# Patient Record
Sex: Male | Born: 1965 | Race: White | Hispanic: No | Marital: Married | Smoking: Current some day smoker
Health system: Southern US, Community
[De-identification: ages and names within clinical notes are randomized; demographics above are authoritative.]

## PROBLEM LIST (undated history)

## (undated) DIAGNOSIS — I2510 Atherosclerotic heart disease of native coronary artery without angina pectoris: Secondary | ICD-10-CM

## (undated) DIAGNOSIS — I639 Cerebral infarction, unspecified: Secondary | ICD-10-CM

## (undated) DIAGNOSIS — C801 Malignant (primary) neoplasm, unspecified: Secondary | ICD-10-CM

## (undated) DIAGNOSIS — F1010 Alcohol abuse, uncomplicated: Secondary | ICD-10-CM

## (undated) DIAGNOSIS — R569 Unspecified convulsions: Secondary | ICD-10-CM

## (undated) DIAGNOSIS — Z720 Tobacco use: Secondary | ICD-10-CM

## (undated) DIAGNOSIS — I10 Essential (primary) hypertension: Secondary | ICD-10-CM

## (undated) HISTORY — PX: CORONARY ANGIOPLASTY WITH STENT PLACEMENT: SHX49

---

## 2015-03-16 DIAGNOSIS — R51 Headache: Secondary | ICD-10-CM | POA: Diagnosis not present

## 2015-03-16 DIAGNOSIS — I6521 Occlusion and stenosis of right carotid artery: Secondary | ICD-10-CM | POA: Diagnosis not present

## 2015-03-16 DIAGNOSIS — I639 Cerebral infarction, unspecified: Secondary | ICD-10-CM | POA: Diagnosis not present

## 2015-03-16 DIAGNOSIS — G459 Transient cerebral ischemic attack, unspecified: Secondary | ICD-10-CM | POA: Diagnosis not present

## 2015-03-16 DIAGNOSIS — R202 Paresthesia of skin: Secondary | ICD-10-CM | POA: Diagnosis not present

## 2015-03-16 DIAGNOSIS — Z6834 Body mass index (BMI) 34.0-34.9, adult: Secondary | ICD-10-CM | POA: Diagnosis not present

## 2015-03-16 DIAGNOSIS — F17210 Nicotine dependence, cigarettes, uncomplicated: Secondary | ICD-10-CM | POA: Diagnosis not present

## 2015-03-16 DIAGNOSIS — F319 Bipolar disorder, unspecified: Secondary | ICD-10-CM | POA: Diagnosis not present

## 2015-03-16 DIAGNOSIS — I63411 Cerebral infarction due to embolism of right middle cerebral artery: Secondary | ICD-10-CM | POA: Diagnosis not present

## 2015-03-16 DIAGNOSIS — I517 Cardiomegaly: Secondary | ICD-10-CM | POA: Diagnosis not present

## 2015-03-16 DIAGNOSIS — F17200 Nicotine dependence, unspecified, uncomplicated: Secondary | ICD-10-CM | POA: Diagnosis not present

## 2015-03-16 DIAGNOSIS — E785 Hyperlipidemia, unspecified: Secondary | ICD-10-CM | POA: Diagnosis not present

## 2015-03-16 DIAGNOSIS — I252 Old myocardial infarction: Secondary | ICD-10-CM | POA: Diagnosis not present

## 2015-03-16 DIAGNOSIS — Z955 Presence of coronary angioplasty implant and graft: Secondary | ICD-10-CM | POA: Diagnosis not present

## 2015-03-16 DIAGNOSIS — I63311 Cerebral infarction due to thrombosis of right middle cerebral artery: Secondary | ICD-10-CM | POA: Diagnosis not present

## 2015-03-16 DIAGNOSIS — I10 Essential (primary) hypertension: Secondary | ICD-10-CM | POA: Diagnosis not present

## 2015-03-16 DIAGNOSIS — G9389 Other specified disorders of brain: Secondary | ICD-10-CM | POA: Diagnosis not present

## 2015-03-16 DIAGNOSIS — I2510 Atherosclerotic heart disease of native coronary artery without angina pectoris: Secondary | ICD-10-CM | POA: Diagnosis not present

## 2015-03-16 DIAGNOSIS — E669 Obesity, unspecified: Secondary | ICD-10-CM | POA: Diagnosis not present

## 2015-03-16 DIAGNOSIS — R200 Anesthesia of skin: Secondary | ICD-10-CM | POA: Diagnosis not present

## 2015-03-16 DIAGNOSIS — R030 Elevated blood-pressure reading, without diagnosis of hypertension: Secondary | ICD-10-CM | POA: Diagnosis not present

## 2015-04-06 DIAGNOSIS — I63411 Cerebral infarction due to embolism of right middle cerebral artery: Secondary | ICD-10-CM | POA: Diagnosis not present

## 2015-04-06 DIAGNOSIS — I2510 Atherosclerotic heart disease of native coronary artery without angina pectoris: Secondary | ICD-10-CM | POA: Diagnosis not present

## 2016-12-17 DIAGNOSIS — F10230 Alcohol dependence with withdrawal, uncomplicated: Secondary | ICD-10-CM | POA: Diagnosis not present

## 2016-12-17 DIAGNOSIS — I2510 Atherosclerotic heart disease of native coronary artery without angina pectoris: Secondary | ICD-10-CM | POA: Diagnosis not present

## 2016-12-17 DIAGNOSIS — F1320 Sedative, hypnotic or anxiolytic dependence, uncomplicated: Secondary | ICD-10-CM | POA: Diagnosis not present

## 2016-12-17 DIAGNOSIS — F1329 Sedative, hypnotic or anxiolytic dependence with unspecified sedative, hypnotic or anxiolytic-induced disorder: Secondary | ICD-10-CM | POA: Diagnosis not present

## 2016-12-17 DIAGNOSIS — F69 Unspecified disorder of adult personality and behavior: Secondary | ICD-10-CM | POA: Diagnosis not present

## 2016-12-17 DIAGNOSIS — Z8673 Personal history of transient ischemic attack (TIA), and cerebral infarction without residual deficits: Secondary | ICD-10-CM | POA: Diagnosis not present

## 2016-12-17 DIAGNOSIS — F1020 Alcohol dependence, uncomplicated: Secondary | ICD-10-CM | POA: Diagnosis not present

## 2016-12-17 DIAGNOSIS — I252 Old myocardial infarction: Secondary | ICD-10-CM | POA: Diagnosis not present

## 2016-12-17 DIAGNOSIS — I10 Essential (primary) hypertension: Secondary | ICD-10-CM | POA: Diagnosis not present

## 2016-12-17 DIAGNOSIS — F10239 Alcohol dependence with withdrawal, unspecified: Secondary | ICD-10-CM | POA: Diagnosis not present

## 2016-12-17 DIAGNOSIS — Z8546 Personal history of malignant neoplasm of prostate: Secondary | ICD-10-CM | POA: Diagnosis not present

## 2016-12-17 DIAGNOSIS — F17210 Nicotine dependence, cigarettes, uncomplicated: Secondary | ICD-10-CM | POA: Diagnosis not present

## 2016-12-17 DIAGNOSIS — F1910 Other psychoactive substance abuse, uncomplicated: Secondary | ICD-10-CM | POA: Diagnosis not present

## 2016-12-17 DIAGNOSIS — F13239 Sedative, hypnotic or anxiolytic dependence with withdrawal, unspecified: Secondary | ICD-10-CM | POA: Diagnosis not present

## 2016-12-17 DIAGNOSIS — Z905 Acquired absence of kidney: Secondary | ICD-10-CM | POA: Diagnosis not present

## 2017-01-02 DIAGNOSIS — Z8546 Personal history of malignant neoplasm of prostate: Secondary | ICD-10-CM | POA: Diagnosis not present

## 2017-01-02 DIAGNOSIS — I252 Old myocardial infarction: Secondary | ICD-10-CM | POA: Diagnosis not present

## 2017-01-02 DIAGNOSIS — F17200 Nicotine dependence, unspecified, uncomplicated: Secondary | ICD-10-CM | POA: Diagnosis not present

## 2017-01-02 DIAGNOSIS — F1010 Alcohol abuse, uncomplicated: Secondary | ICD-10-CM | POA: Diagnosis not present

## 2017-01-02 DIAGNOSIS — Z85528 Personal history of other malignant neoplasm of kidney: Secondary | ICD-10-CM | POA: Diagnosis not present

## 2017-01-02 DIAGNOSIS — Z8673 Personal history of transient ischemic attack (TIA), and cerebral infarction without residual deficits: Secondary | ICD-10-CM | POA: Diagnosis not present

## 2017-01-02 DIAGNOSIS — Z6829 Body mass index (BMI) 29.0-29.9, adult: Secondary | ICD-10-CM | POA: Diagnosis not present

## 2018-02-19 DIAGNOSIS — E872 Acidosis: Secondary | ICD-10-CM

## 2018-02-19 DIAGNOSIS — R748 Abnormal levels of other serum enzymes: Secondary | ICD-10-CM

## 2018-02-19 DIAGNOSIS — F10239 Alcohol dependence with withdrawal, unspecified: Secondary | ICD-10-CM | POA: Diagnosis not present

## 2018-02-20 DIAGNOSIS — F10239 Alcohol dependence with withdrawal, unspecified: Secondary | ICD-10-CM | POA: Diagnosis not present

## 2018-02-20 DIAGNOSIS — R748 Abnormal levels of other serum enzymes: Secondary | ICD-10-CM | POA: Diagnosis not present

## 2018-02-20 DIAGNOSIS — E872 Acidosis: Secondary | ICD-10-CM | POA: Diagnosis not present

## 2018-02-21 DIAGNOSIS — R748 Abnormal levels of other serum enzymes: Secondary | ICD-10-CM | POA: Diagnosis not present

## 2018-02-21 DIAGNOSIS — F10239 Alcohol dependence with withdrawal, unspecified: Secondary | ICD-10-CM | POA: Diagnosis not present

## 2018-02-21 DIAGNOSIS — E872 Acidosis: Secondary | ICD-10-CM | POA: Diagnosis not present

## 2018-08-04 ENCOUNTER — Inpatient Hospital Stay (HOSPITAL_COMMUNITY)
Admission: AD | Admit: 2018-08-04 | Discharge: 2018-08-07 | DRG: 065 | Disposition: A | Discharge status: Home Health | Payer: Medicare HMO | Source: Other Acute Inpatient Hospital | Attending: Internal Medicine | Admitting: Internal Medicine

## 2018-08-04 DIAGNOSIS — I10 Essential (primary) hypertension: Secondary | ICD-10-CM | POA: Diagnosis not present

## 2018-08-04 DIAGNOSIS — R402252 Coma scale, best verbal response, oriented, at arrival to emergency department: Secondary | ICD-10-CM | POA: Diagnosis not present

## 2018-08-04 DIAGNOSIS — R402362 Coma scale, best motor response, obeys commands, at arrival to emergency department: Secondary | ICD-10-CM | POA: Diagnosis present

## 2018-08-04 DIAGNOSIS — E876 Hypokalemia: Secondary | ICD-10-CM | POA: Diagnosis not present

## 2018-08-04 DIAGNOSIS — Z9114 Patient's other noncompliance with medication regimen: Secondary | ICD-10-CM | POA: Diagnosis not present

## 2018-08-04 DIAGNOSIS — I2583 Coronary atherosclerosis due to lipid rich plaque: Secondary | ICD-10-CM | POA: Diagnosis not present

## 2018-08-04 DIAGNOSIS — I340 Nonrheumatic mitral (valve) insufficiency: Secondary | ICD-10-CM | POA: Diagnosis not present

## 2018-08-04 DIAGNOSIS — I129 Hypertensive chronic kidney disease with stage 1 through stage 4 chronic kidney disease, or unspecified chronic kidney disease: Secondary | ICD-10-CM | POA: Diagnosis present

## 2018-08-04 DIAGNOSIS — R29702 NIHSS score 2: Secondary | ICD-10-CM | POA: Diagnosis present

## 2018-08-04 DIAGNOSIS — R402142 Coma scale, eyes open, spontaneous, at arrival to emergency department: Secondary | ICD-10-CM | POA: Diagnosis not present

## 2018-08-04 DIAGNOSIS — Z8249 Family history of ischemic heart disease and other diseases of the circulatory system: Secondary | ICD-10-CM

## 2018-08-04 DIAGNOSIS — R911 Solitary pulmonary nodule: Secondary | ICD-10-CM | POA: Diagnosis present

## 2018-08-04 DIAGNOSIS — I639 Cerebral infarction, unspecified: Secondary | ICD-10-CM | POA: Diagnosis present

## 2018-08-04 DIAGNOSIS — Z720 Tobacco use: Secondary | ICD-10-CM | POA: Diagnosis not present

## 2018-08-04 DIAGNOSIS — Z23 Encounter for immunization: Secondary | ICD-10-CM | POA: Diagnosis present

## 2018-08-04 DIAGNOSIS — N182 Chronic kidney disease, stage 2 (mild): Secondary | ICD-10-CM | POA: Diagnosis present

## 2018-08-04 DIAGNOSIS — H543 Unqualified visual loss, both eyes: Secondary | ICD-10-CM | POA: Diagnosis present

## 2018-08-04 DIAGNOSIS — D62 Acute posthemorrhagic anemia: Secondary | ICD-10-CM | POA: Diagnosis present

## 2018-08-04 DIAGNOSIS — I6300 Cerebral infarction due to thrombosis of unspecified precerebral artery: Secondary | ICD-10-CM | POA: Diagnosis not present

## 2018-08-04 DIAGNOSIS — F17210 Nicotine dependence, cigarettes, uncomplicated: Secondary | ICD-10-CM | POA: Diagnosis present

## 2018-08-04 DIAGNOSIS — E785 Hyperlipidemia, unspecified: Secondary | ICD-10-CM

## 2018-08-04 DIAGNOSIS — I2510 Atherosclerotic heart disease of native coronary artery without angina pectoris: Secondary | ICD-10-CM | POA: Diagnosis present

## 2018-08-04 DIAGNOSIS — N179 Acute kidney failure, unspecified: Secondary | ICD-10-CM | POA: Diagnosis present

## 2018-08-04 DIAGNOSIS — Z8673 Personal history of transient ischemic attack (TIA), and cerebral infarction without residual deficits: Secondary | ICD-10-CM | POA: Diagnosis not present

## 2018-08-04 DIAGNOSIS — Z955 Presence of coronary angioplasty implant and graft: Secondary | ICD-10-CM | POA: Diagnosis not present

## 2018-08-04 HISTORY — DX: Essential (primary) hypertension: I10

## 2018-08-04 LAB — CREATININE, SERUM
Creatinine, Ser: 1.32 mg/dL — ABNORMAL HIGH (ref 0.61–1.24)
GFR, EST NON AFRICAN AMERICAN: 60 mL/min — AB (ref >60)

## 2018-08-04 LAB — CBC
HEMATOCRIT: 36 % — AB (ref 39.0–52.0)
Hemoglobin: 11.8 g/dL — ABNORMAL LOW (ref 13.0–17.0)
MCH: 32 pg (ref 26.0–34.0)
MCHC: 32.8 g/dL (ref 30.0–36.0)
MCV: 97.6 fL (ref 78.0–100.0)
Platelets: 156 10*3/uL (ref 150–400)
RBC: 3.69 MIL/uL — ABNORMAL LOW (ref 4.22–5.81)
RDW: 13.5 % (ref 11.5–15.5)
WBC: 8.6 10*3/uL (ref 4.0–10.5)

## 2018-08-04 MED ORDER — STROKE: EARLY STAGES OF RECOVERY BOOK
Freq: Once | Status: AC
  Administered 2018-08-04

## 2018-08-04 MED ORDER — HYDRALAZINE HCL 20 MG/ML IJ SOLN: 10.0000 mg | Freq: Once | INTRAMUSCULAR | Status: DC

## 2018-08-04 MED ORDER — ENOXAPARIN SODIUM 40 MG/0.4ML ~~LOC~~ SOLN
40.0000 mg | SUBCUTANEOUS | Status: DC
  Administered 2018-08-04 – 2018-08-06 (×3): 40 mg via SUBCUTANEOUS
  Filled 2018-08-04 (×3): qty 0.4

## 2018-08-04 MED ORDER — ASPIRIN 325 MG PO TABS
325.0000 mg | ORAL_TABLET | Freq: Every day | ORAL | Status: DC
  Administered 2018-08-04 – 2018-08-07 (×4): 325 mg via ORAL
  Filled 2018-08-04 (×4): qty 1

## 2018-08-04 MED ORDER — ASPIRIN 300 MG RE SUPP: 300.0000 mg | Freq: Every day | RECTAL | Status: DC

## 2018-08-04 MED ORDER — ACETAMINOPHEN 650 MG RE SUPP: 650.0000 mg | Freq: Four times a day (QID) | RECTAL | Status: DC | PRN

## 2018-08-04 MED ORDER — ACETAMINOPHEN 325 MG PO TABS
650.0000 mg | ORAL_TABLET | Freq: Four times a day (QID) | ORAL | Status: DC | PRN
  Administered 2018-08-05 – 2018-08-07 (×4): 650 mg via ORAL
  Filled 2018-08-04 (×4): qty 2

## 2018-08-04 MED ORDER — ATORVASTATIN CALCIUM 80 MG PO TABS
80.0000 mg | ORAL_TABLET | Freq: Every day | ORAL | Status: DC
  Administered 2018-08-05 – 2018-08-06 (×2): 80 mg via ORAL
  Filled 2018-08-04 (×2): qty 1

## 2018-08-04 MED ORDER — ONDANSETRON HCL 4 MG/2ML IJ SOLN: 4.0000 mg | Freq: Four times a day (QID) | INTRAMUSCULAR | Status: DC | PRN

## 2018-08-04 MED ORDER — ONDANSETRON HCL 4 MG PO TABS: 4.0000 mg | ORAL_TABLET | Freq: Four times a day (QID) | ORAL | Status: DC | PRN

## 2018-08-04 NOTE — H&P (Signed)
History and Physical    Douglas Fowler PYK:[id number] DOB: [birth year] DOA: 08/04/2018  PCP: Imagene Riches, NP   Patient coming from: Home   Chief Complaint: Episodic visual loss.   HPI: Douglas Fowler is a 52 y.o. male with medical history significant of coronary artery disease, hypertension, dyslipidemia and prior strokes who presents with episodic visual loss.  Patient reported 2-week history of intermittent sudden acute visual loss, with no prodromal symptoms, no triggering factors, the events tend to last for a few minutes, it has been associated with bilateral lower extremity weakness with ambulatory dysfunction and disbalance, no improving or worsening factors.  Today he was seen by his primary care provider and recommend him to go to the hospital for further evaluation.  Apparently he stopped taking his antihypertensives and antihyperlipidemic medications about 7 months ago.   ED Course: Patient was seen at [facility], CT of the head showed new right frontal and parietal lobe densities concerning for new ischemic stroke.  Neurology was consulted and patient was transferred to [facility] for further evaluation.  Review of Systems:  1. General: No fevers, no chills, no weight gain or weight loss 2. ENT: No runny nose or sore throat, no hearing disturbances 3. Pulmonary: No dyspnea, cough, wheezing, or hemoptysis 4. Cardiovascular: No angina, claudication, lower extremity edema, pnd or orthopnea 5. Gastrointestinal: No nausea or vomiting, no diarrhea or constipation 6. Hematology: No easy bruisability or frequent infections 7. Urology: No dysuria, hematuria or increased urinary frequency 8. Dermatology: No rashes. 9. Neurology: No seizures or paresthesias. Intermittent visual loss as mentioned in HPI, positive disbalance.  10. Musculoskeletal: No joint pain or deformities  No past medical history on file.     has no tobacco, alcohol, and drug history on file.  Allergies  not on file  No family history on file.  Family history reviewed and found to be noncontributory.   Prior to Admission medications   Not on File    Physical Exam: Vitals:   08/04/18 2043  BP: (!) 157/103  Pulse: 72  Resp: 18  Temp: 97.8 F (36.6 C)  TempSrc: Axillary  SpO2: 100%  Weight: 80.8 kg  Height: 5\' 6"  (1.676 m)    Constitutional: deconditioned  Vitals:   08/04/18 2043  BP: (!) 157/103  Pulse: 72  Resp: 18  Temp: 97.8 F (36.6 C)  TempSrc: Axillary  SpO2: 100%  Weight: 80.8 kg  Height: 5\' 6"  (1.676 m)   Eyes: PERRL, lids and conjunctivae normal Head normocephalic, nose and ears with no deformities ENMT: Mucous membranes are moist. Posterior pharynx clear of any exudate or lesions.Normal dentition.  Neck: normal, supple, no masses, no thyromegaly Respiratory: clear to auscultation bilaterally, no wheezing, no crackles. Normal respiratory effort. No accessory muscle use.  Cardiovascular: Regular rate and rhythm, no murmurs / rubs / gallops. No extremity edema. 2+ pedal pulses. No carotid bruits.  Abdomen: no tenderness, no masses palpated. No hepatosplenomegaly. Bowel sounds positive.  Musculoskeletal: no clubbing / cyanosis. No joint deformity upper and lower extremities. Good ROM, no contractures. Normal muscle tone.  Skin: no rashes, lesions, ulcers. No induration Neurologic: preserved strength and coordination in 4 extremities, no evident cranial nerves dysfunction.    Labs on Admission: I have personally reviewed following labs and imaging studies  CBC: No results for input(s): WBC, NEUTROABS, HGB, HCT, MCV, PLT in the last 168 hours. Basic Metabolic Panel: No results for input(s): NA, K, CL, CO2, GLUCOSE, BUN, CREATININE, CALCIUM, MG, PHOS  in the last 168 hours. GFR: CrCl cannot be calculated (No successful lab value found.). Liver Function Tests: No results for input(s): AST, ALT, ALKPHOS, BILITOT, PROT, ALBUMIN in the last 168 hours. No results  for input(s): LIPASE, AMYLASE in the last 168 hours. No results for input(s): AMMONIA in the last 168 hours. Coagulation Profile: No results for input(s): INR, PROTIME in the last 168 hours. Cardiac Enzymes: No results for input(s): CKTOTAL, CKMB, CKMBINDEX, TROPONINI in the last 168 hours. BNP (last 3 results) No results for input(s): PROBNP in the last 8760 hours. HbA1C: No results for input(s): HGBA1C in the last 72 hours. CBG: No results for input(s): GLUCAP in the last 168 hours. Lipid Profile: No results for input(s): CHOL, HDL, LDLCALC, TRIG, CHOLHDL, LDLDIRECT in the last 72 hours. Thyroid Function Tests: No results for input(s): TSH, T4TOTAL, FREET4, T3FREE, THYROIDAB in the last 72 hours. Anemia Panel: No results for input(s): VITAMINB12, FOLATE, FERRITIN, TIBC, IRON, RETICCTPCT in the last 72 hours. Urine analysis: No results found for: COLORURINE, APPEARANCEUR, LABSPEC, PHURINE, GLUCOSEU, HGBUR, BILIRUBINUR, KETONESUR, PROTEINUR, UROBILINOGEN, NITRITE, LEUKOCYTESUR  Radiological Exams on Admission: No results found.  EKG: Independently reviewed. NA  Assessment/Plan Active Problems:   CVA (cerebral vascular accident) ([place])  52 year old male with history of prior CVAs, has been noncompliant with antihypertensive and antihyperlipidemic agents for the last 7 months.  Presents with intermittent episodes of visual loss, lasting for about few minutes, associated with disbalance and amatory dysfunction.  On the initial physical examination patient is awake and alert, he is neurologically nonfocal blood pressure 199/70, heart rate 77, respiratory rate 23, oxygen saturation 97%.  Moist mucous membranes, lungs clear to auscultation bilaterally, heart S1-S2 present and rhythmic, abdomen soft nontender, no lower extremity edema.  White cell count 8.6, hemoglobin 11.8, hematocrit 36, platelets 156, creatinine 1.32, CT of the head showed new right frontal and parietal lobe densities  concerning for new ischemic stroke.   Patient will be admitted to the hospital with working diagnosis of focal neurologic deficit due to acute CVA  1.  Intermittent visual loss, CVA.  Patient will be admitted to the medical ward, he will be placed in a remote telemetry monitor, frequent neuro checks, NIH score per protocol.  Aspirin, statin, physical therapy, speech therapy, occupational therapy.  Further testing with brain MRI/MRA,,echocardiography, carotid Doppler ultrasonography and follow-up on stroke team in the morning.  Check lipid profile and hemoglobin A1c.  2.  Hypertension.  Continue blood pressure monitor, will avoid significant decrease in blood pressure in the setting of acute CVA.  Systolic 076 on admission.  3.  Dyslipidemia.  Lipid profile, continue atorvastatin.  5.  Chronic kidney disease stage 2.  Follow-up kidney function in the morning, avoid hypotension nephrotoxic agents.  DVT prophylaxis:  Enoxaparin  Code Status: full  Family Communication: I spoke with patient's wife at the bedside and all questions were addressed.   Disposition Plan:  Home pending clinical improvement   Consults called:  Neurology   Admission status: Inpatient.     Mauricio Gerome Apley MD [facility] Pager [phone]  If 7PM-7AM, please contact night-coverage [email or web address] Password [email or web address]  08/04/2018, 9:05 PM

## 2018-08-05 ENCOUNTER — Inpatient Hospital Stay (HOSPITAL_COMMUNITY): Payer: Medicare HMO

## 2018-08-05 ENCOUNTER — Encounter (HOSPITAL_COMMUNITY): Payer: Self-pay | Admitting: Speech Pathology

## 2018-08-05 DIAGNOSIS — I2510 Atherosclerotic heart disease of native coronary artery without angina pectoris: Secondary | ICD-10-CM | POA: Diagnosis present

## 2018-08-05 DIAGNOSIS — E876 Hypokalemia: Secondary | ICD-10-CM

## 2018-08-05 DIAGNOSIS — Z720 Tobacco use: Secondary | ICD-10-CM | POA: Diagnosis present

## 2018-08-05 DIAGNOSIS — N179 Acute kidney failure, unspecified: Secondary | ICD-10-CM

## 2018-08-05 DIAGNOSIS — I340 Nonrheumatic mitral (valve) insufficiency: Secondary | ICD-10-CM

## 2018-08-05 DIAGNOSIS — I639 Cerebral infarction, unspecified: Principal | ICD-10-CM

## 2018-08-05 DIAGNOSIS — I10 Essential (primary) hypertension: Secondary | ICD-10-CM

## 2018-08-05 DIAGNOSIS — D62 Acute posthemorrhagic anemia: Secondary | ICD-10-CM

## 2018-08-05 LAB — LIPID PANEL
CHOL/HDL RATIO: 4.6 ratio
Cholesterol: 175 mg/dL (ref 0–200)
HDL: 38 mg/dL — AB (ref >40)
LDL Cholesterol: 104 mg/dL — ABNORMAL HIGH (ref 0–99)
Triglycerides: 167 mg/dL — ABNORMAL HIGH (ref <150)
VLDL: 33 mg/dL (ref 0–40)

## 2018-08-05 LAB — BASIC METABOLIC PANEL
ANION GAP: 8 (ref 5–15)
BUN: 14 mg/dL (ref 6–20)
CO2: 26 mmol/L (ref 22–32)
Calcium: 8.8 mg/dL — ABNORMAL LOW (ref 8.9–10.3)
Chloride: 106 mmol/L (ref 98–111)
Creatinine, Ser: 1.23 mg/dL (ref 0.61–1.24)
GFR calc Af Amer: >60 mL/min (ref >60)
GLUCOSE: 119 mg/dL — AB (ref 70–99)
POTASSIUM: 3.3 mmol/L — AB (ref 3.5–5.1)
Sodium: 140 mmol/L (ref 135–145)

## 2018-08-05 LAB — CBC
HEMATOCRIT: 35.5 % — AB (ref 39.0–52.0)
HEMOGLOBIN: 11.6 g/dL — AB (ref 13.0–17.0)
MCH: 31.9 pg (ref 26.0–34.0)
MCHC: 32.7 g/dL (ref 30.0–36.0)
MCV: 97.5 fL (ref 78.0–100.0)
Platelets: 154 10*3/uL (ref 150–400)
RBC: 3.64 MIL/uL — ABNORMAL LOW (ref 4.22–5.81)
RDW: 13.2 % (ref 11.5–15.5)
WBC: 7.6 10*3/uL (ref 4.0–10.5)

## 2018-08-05 LAB — ECHOCARDIOGRAM COMPLETE
Height: 66 in
WEIGHTICAEL: 2850.11 [oz_av]

## 2018-08-05 LAB — HEMOGLOBIN A1C
Hgb A1c MFr Bld: 5.6 % (ref 4.8–5.6)
MEAN PLASMA GLUCOSE: 114.02 mg/dL

## 2018-08-05 LAB — HIV ANTIBODY (ROUTINE TESTING W REFLEX): HIV Screen 4th Generation wRfx: NONREACTIVE

## 2018-08-05 MED ORDER — INFLUENZA VAC SPLIT QUAD 0.5 ML IM SUSY
0.5000 mL | PREFILLED_SYRINGE | INTRAMUSCULAR | Status: AC
  Administered 2018-08-06: 0.5 mL via INTRAMUSCULAR
  Filled 2018-08-05: qty 0.5

## 2018-08-05 NOTE — Evaluation (Signed)
Occupational Therapy Evaluation Patient Details Name: Douglas Fowler MRN: [id number] DOB: [birth year] Today's Date: 08/05/2018    History of Present Illness Douglas Fowler is a 52 y.o. male PMH including CAD, HTN, dyslipidemia and prior strokes who presents with episodic visual loss associated with bilateral lower extremity weakness with ambulatory dysfunction and disbalance. CT of the head showed new right frontal and parietal lobe densities concerning for new ischemic stroke. MRI pending.   Clinical Impression   PTA Pt independent in ADL/IADL and mobility. 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 Pt is currently mod A for mobility in room and generally for ADL. Significant change in vision (please see vision section below) and left visual deficit with potentially left inattention as well?!? Requires significant assist for short mobility from bed to sink and then to recliner with left lateral lean noted during functional tasks at sink. Pt will benefit from skilled OT in the acute setting and also at the CIR level to maximize safety and independence in ADL and functional transfers with focus on cognition and vision impacting functional tasks.     Follow Up Recommendations  CIR;Supervision/Assistance - 24 hour    Equipment Recommendations  Tub/shower seat    Recommendations for Other Services Rehab consult     Precautions / Restrictions Precautions Precautions: Fall Precaution Comments: left inattention? visual deficits Restrictions Weight Bearing Restrictions: No      Mobility Bed Mobility Overal bed mobility: Needs Assistance Bed Mobility: Supine to Sit;Sit to Supine     Supine to sit: Supervision Sit to supine: Supervision   General bed mobility comments: use of rail to assist  Transfers Overall transfer level: Needs assistance Equipment used: None Transfers: Sit to/from Stand Sit to Stand: Min guard          General transfer comment: min guard for balance    Balance Overall balance assessment: Needs assistance Sitting-balance support: No upper extremity supported Sitting balance-Leahy Scale: Normal   Postural control: Left lateral lean Standing balance support: Single extremity supported;During functional activity Standing balance-Leahy Scale: Fair Standing balance comment: left lateral lean during functional activities at sink                           ADL either performed or assessed with clinical judgement   ADL Overall ADL's : Needs assistance/impaired Eating/Feeding: Moderate assistance;Cueing for compensatory techinques Eating/Feeding Details (indicate cue type and reason): Pt required cues to find objects on the left side of tray Grooming: Wash/dry hands;Wash/dry face;Oral care;Moderate assistance;Standing Grooming Details (indicate cue type and reason): mod A to find items on the left side of sink Upper Body Bathing: Minimal assistance;Sitting   Lower Body Bathing: Minimal assistance;Sitting/lateral leans   Upper Body Dressing : Minimal assistance Upper Body Dressing Details (indicate cue type and reason): to don hospital gown like robe Lower Body Dressing: Min guard;Sitting/lateral leans Lower Body Dressing Details (indicate cue type and reason): to don socks EOB Toilet Transfer: Moderate assistance;Ambulation;Cueing for safety;Comfort height toilet;Grab bars Toilet Transfer Details (indicate cue type and reason): Pt requires mod A for environment navigation and  Toileting- Clothing Manipulation and Hygiene: Min guard;Sit to/from stand       Functional mobility during ADLs: Moderate assistance;Cueing for safety       Vision Baseline Vision/History: Wears glasses Wears Glasses: Reading only Patient Visual Report: Diplopia;Blurring of vision;Peripheral vision impairment Vision Assessment?: Yes;Vision impaired- to be further  tested in functional  context Ocular Range of Motion: Within Functional Limits Alignment/Gaze Preference: Gaze right Tracking/Visual Pursuits: Impaired - to be further tested in functional context;Decreased smoothness of horizontal tracking;Requires cues, head turns, or add eye shifts to track Saccades: Impaired - to be further tested in functional context(does not come back to left - requires cues) Visual Fields: Left visual field deficit;Impaired-to be further tested in functional context Diplopia Assessment: Disappears with one eye closed;Objects split side to side;Only with left gaze(inconsistent) Additional Comments: pt reports inconsistent and intermittent double vision, but able to read correct number of fingers 50% of the time     Perception     Praxis      Pertinent Vitals/Pain Pain Assessment: 0-10 Pain Score: 3  Pain Location: headache Pain Descriptors / Indicators: Headache Pain Intervention(s): Monitored during session;Repositioned     Hand Dominance Right   Extremity/Trunk Assessment Upper Extremity Assessment Upper Extremity Assessment: Overall WFL for tasks assessed   Lower Extremity Assessment Lower Extremity Assessment: Defer to PT evaluation       Communication Communication Communication: No difficulties   Cognition Arousal/Alertness: Awake/alert Behavior During Therapy: WFL for tasks assessed/performed Overall Cognitive Status: Impaired/Different from baseline Area of Impairment: Following commands;Safety/judgement;Problem solving                       Following Commands: Follows one step commands with increased time Safety/Judgement: Decreased awareness of deficits   Problem Solving: Slow processing;Decreased initiation;Difficulty sequencing;Requires verbal cues;Requires tactile cues General Comments: Pt unable to follow verbal cues, required multimodal cues this session   General Comments  Pt tearful at the end of session about current situation     Exercises     Shoulder Instructions      Home Living Family/patient expects to be discharged to:: Private residence Living Arrangements: Spouse/significant other Available Help at Discharge: Family;Available PRN/intermittently Type of Home: Apartment Home Access: Level entry     Home Layout: One level     Bathroom Shower/Tub: Tub/shower unit;Curtain   Biochemist, clinical: Standard Bathroom Accessibility: Yes How Accessible: Accessible via wheelchair Home Equipment: Grab bars - tub/shower      Lives With: Spouse    Prior Functioning/Environment Level of Independence: Independent        Comments: retired Building control surveyor, swims daily, manages household IADL and finances while wife is at work        OT Problem List: Decreased activity tolerance;Impaired balance (sitting and/or standing);Impaired vision/perception;Decreased coordination;Decreased cognition;Decreased safety awareness;Decreased knowledge of use of DME or AE;Obesity      OT Treatment/Interventions: Self-care/ADL training;DME and/or AE instruction;Therapeutic activities;Cognitive remediation/compensation;Visual/perceptual remediation/compensation;Patient/family education;Balance training    OT Goals(Current goals can be found in the care plan section) Acute Rehab OT Goals Patient Stated Goal: to be able to see and be independent again OT Goal Formulation: With patient Time For Goal Achievement: 08/19/18 Potential to Achieve Goals: Good ADL Goals Pt Will Perform Eating: with modified independence Pt Will Perform Grooming: with modified independence;standing Pt Will Transfer to Toilet: with modified independence;ambulating Pt Will Perform Toileting - Clothing Manipulation and hygiene: with modified independence;sit to/from stand Additional ADL Goal #1: PT will recall 3 compensatory strategies for visual deficits at independent level  OT Frequency: Min 3X/week   Barriers to D/C:            Co-evaluation               AM-PAC PT "6 Clicks" Daily Activity     Outcome Measure Help from  another person eating meals?: A Lot Help from another person taking care of personal grooming?: A Lot Help from another person toileting, which includes using toliet, bedpan, or urinal?: A Little Help from another person bathing (including washing, rinsing, drying)?: A Little Help from another person to put on and taking off regular upper body clothing?: A Little Help from another person to put on and taking off regular lower body clothing?: A Little 6 Click Score: 16   End of Session Equipment Utilized During Treatment: Gait belt Nurse Communication: Mobility status;Precautions  Activity Tolerance: Patient tolerated treatment well Patient left: in chair;with call bell/phone within reach;with chair alarm set  OT Visit Diagnosis: Unsteadiness on feet (R26.81);Other abnormalities of gait and mobility (R26.89);Repeated falls (R29.6);Other symptoms and signs involving cognitive function;Low vision, both eyes (H54.2)                Time: 4196-2229 OT Time Calculation (min): 35 min Charges:  OT General Charges $OT Visit: 1 Visit OT Evaluation $OT Eval Moderate Complexity: 1 Mod OT Treatments $Self Care/Home Management : 8-22 mins  Hulda Humphrey OTR/L Acute Rehabilitation Services Pager: [phone] Office: [place] 08/05/2018, 12:00 PM

## 2018-08-05 NOTE — Progress Notes (Signed)
Inpatient Rehabilitation Admissions Coordinator  I will follow patient's progress with therapy and assist with determining most appropriate rehab venue. Noted OT and SLP assessment. I await PT eval.  Danne Baxter, RN, MSN Rehab Admissions Coordinator [phone] 08/05/2018 3:29 PM

## 2018-08-05 NOTE — Progress Notes (Signed)
OT Cancellation Note  Patient Details Name: Douglas Fowler MRN: [id number] DOB: May 11, 1966   Cancelled Treatment:    Reason Eval/Treat Not Completed: Patient at procedure or test/ unavailable. OT will continue to follow for evaluation.  Merri Ray Tameka Hoiland 08/05/2018, 10:33 AM  Hulda Humphrey OTR/L Acute Rehabilitation Services Pager: [phone] Office: [phone]

## 2018-08-05 NOTE — Consult Note (Addendum)
Physical Medicine and Rehabilitation Consult Reason for Consult: Decreased functional mobility Referring Physician: [facility]   HPI: Douglas Fowler is a 52 y.o. right-handed male with unremarkable past medical history no prescription medications.  Per chart review and patient, patient lives with spouse.  Independent prior to admission.  He is a retired Building control surveyor.  His wife works during the day.  One level apartment.  Presented 08/04/2018 to [facility] with episodic visual loss, bilateral lower extremity weakness and dizziness.  Blood pressure 199/70.  CT of the head at outside hospital showed new right frontal and parietal lobe densities concerning for new ischemic stroke.  MRI/MRA pending.  Mild hypokalemia 3.3, creatinine 1.32.  Neurology consulted with work-up presently ongoing.  Echocardiogram pending.  Presently maintained on aspirin for CVA prophylaxis.  Subcutaneous Lovenox for DVT prophylaxis.  Tolerating a regular diet.  Occupational therapy evaluation completed with recommendations of physical medicine rehab consult.  Review of Systems  Constitutional: Negative for chills and fever.  HENT: Negative for hearing loss.   Eyes: Positive for blurred vision.  Respiratory: Negative for cough and shortness of breath.   Cardiovascular: Negative for chest pain, palpitations and leg swelling.  Gastrointestinal: Positive for constipation. Negative for nausea.  Genitourinary: Negative for dysuria, flank pain and hematuria.  Skin: Negative for rash.  Neurological: Positive for weakness. Negative for sensory change and seizures.  All other systems reviewed and are negative.  No past medical history. No past surgical history. No pertinent family history of premature CVA. Social History:  Denies excessive alcohol, tobacco, and drug history on file. Allergies: No Known Allergies No medications prior to admission.    Home: Home Living Family/patient expects to be discharged to:: Private  residence Living Arrangements: Spouse/significant other Available Help at Discharge: Family, Available PRN/intermittently Type of Home: Apartment Home Access: Level entry Home Layout: One level Bathroom Shower/Tub: Tub/shower unit, Architectural technologist: Programmer, systems: Yes Home Equipment: Grab bars - tub/shower  Lives With: Spouse  Functional History: Prior Function Level of Independence: Independent Comments: retired Building control surveyor, swims daily, manages household IADL and finances while wife is at work Functional Status:  Mobility: Bed Mobility Overal bed mobility: Needs Assistance Bed Mobility: Supine to Sit, Sit to Supine Supine to sit: Supervision Sit to supine: Supervision General bed mobility comments: use of rail to assist Transfers Overall transfer level: Needs assistance Equipment used: None Transfers: Sit to/from Stand Sit to Stand: Min guard General transfer comment: min guard for balance      ADL: ADL Overall ADL's : Needs assistance/impaired Eating/Feeding: Moderate assistance, Cueing for compensatory techinques Eating/Feeding Details (indicate cue type and reason): Pt required cues to find objects on the left side of tray Grooming: Wash/dry hands, Wash/dry face, Oral care, Moderate assistance, Standing Grooming Details (indicate cue type and reason): mod A to find items on the left side of sink Upper Body Bathing: Minimal assistance, Sitting Lower Body Bathing: Minimal assistance, Sitting/lateral leans Upper Body Dressing : Minimal assistance Upper Body Dressing Details (indicate cue type and reason): to don hospital gown like robe Lower Body Dressing: Min guard, Sitting/lateral leans Lower Body Dressing Details (indicate cue type and reason): to don socks EOB Toilet Transfer: Moderate assistance, Ambulation, Cueing for safety, Comfort height toilet, Grab bars Toilet Transfer Details (indicate cue type and reason): Pt requires mod A for  environment navigation and  Toileting- Water quality scientist and Hygiene: Min guard, Sit to/from stand Functional mobility during ADLs: Moderate assistance, Cueing for safety  Cognition: Cognition  Overall Cognitive Status: Impaired/Different from baseline Arousal/Alertness: Lethargic Orientation Level: Oriented X4 Attention: Sustained Sustained Attention: Appears intact Memory: Impaired Memory Impairment: Retrieval deficit Awareness: Impaired Awareness Impairment: Emergent impairment Problem Solving: Impaired Problem Solving Impairment: Functional complex Executive Function: Self Monitoring, Self Correcting Self Monitoring: Impaired Self Monitoring Impairment: Functional complex Self Correcting: Impaired Self Correcting Impairment: Functional complex Safety/Judgment: Impaired Comments: decreased visual scanning to the left of midline but would spontaneously look to the left when therapist was standing on his left, also complaints of blurred vision Cognition Arousal/Alertness: Awake/alert Behavior During Therapy: WFL for tasks assessed/performed Overall Cognitive Status: Impaired/Different from baseline Area of Impairment: Following commands, Safety/judgement, Problem solving Following Commands: Follows one step commands with increased time Safety/Judgement: Decreased awareness of deficits Problem Solving: Slow processing, Decreased initiation, Difficulty sequencing, Requires verbal cues, Requires tactile cues General Comments: Pt unable to follow verbal cues, required multimodal cues this session  Blood pressure (!) 170/99, pulse 74, temperature 98 F (36.7 C), temperature source Oral, resp. rate 18, height 5\' 6"  (1.676 m), weight 80.8 kg, SpO2 100 %. Physical Exam  Vitals reviewed. Constitutional: He is oriented to person, place, and time. He appears well-developed and well-nourished.  HENT:  Head: Normocephalic and atraumatic.  Eyes: EOM are normal. Right eye exhibits no  discharge. Left eye exhibits no discharge.  Neck: Normal range of motion. Neck supple. No thyromegaly present.  Cardiovascular: Normal rate, regular rhythm and normal heart sounds.  Respiratory: Effort normal and breath sounds normal. No respiratory distress.  GI: Soft. Bowel sounds are normal. He exhibits no distension.  Musculoskeletal:  No edema or tenderness in extremities  Neurological: He is alert and oriented to person, place, and time.  Follows full commands.   Fair awareness of deficits. Motor: RUE/RLE: 5/5 proximal to distal LUE/LLE: 4+/5 proximal to distal Sensation intact to light touch  Skin: Skin is warm and dry.  Psychiatric: He has a normal mood and affect. His behavior is normal.    Results for orders placed or performed during the hospital encounter of 08/04/18 (from the past 24 hour(s))  HIV antibody (Routine Testing)     Status: None   Collection Time: 08/04/18 10:11 PM  Result Value Ref Range   HIV Screen 4th Generation wRfx Non Reactive Non Reactive  CBC     Status: Abnormal   Collection Time: 08/04/18 10:11 PM  Result Value Ref Range   WBC 8.6 4.0 - 10.5 K/uL   RBC 3.69 (L) 4.22 - 5.81 MIL/uL   Hemoglobin 11.8 (L) 13.0 - 17.0 g/dL   HCT 36.0 (L) 39.0 - 52.0 %   MCV 97.6 78.0 - 100.0 fL   MCH 32.0 26.0 - 34.0 pg   MCHC 32.8 30.0 - 36.0 g/dL   RDW 13.5 11.5 - 15.5 %   Platelets 156 150 - 400 K/uL  Creatinine, serum     Status: Abnormal   Collection Time: 08/04/18 10:11 PM  Result Value Ref Range   Creatinine, Ser 1.32 (H) 0.61 - 1.24 mg/dL   GFR calc non Af Amer 60 (L) >60 mL/min   GFR calc Af Amer >60 >60 mL/min  Basic metabolic panel     Status: Abnormal   Collection Time: 08/05/18 10:04 AM  Result Value Ref Range   Sodium 140 135 - 145 mmol/L   Potassium 3.3 (L) 3.5 - 5.1 mmol/L   Chloride 106 98 - 111 mmol/L   CO2 26 22 - 32 mmol/L   Glucose, Bld 119 (H) 70 - 99  mg/dL   BUN 14 6 - 20 mg/dL   Creatinine, Ser 1.23 0.61 - 1.24 mg/dL   Calcium  8.8 (L) 8.9 - 10.3 mg/dL   GFR calc non Af Amer >60 >60 mL/min   GFR calc Af Amer >60 >60 mL/min   Anion gap 8 5 - 15  CBC     Status: Abnormal   Collection Time: 08/05/18 10:04 AM  Result Value Ref Range   WBC 7.6 4.0 - 10.5 K/uL   RBC 3.64 (L) 4.22 - 5.81 MIL/uL   Hemoglobin 11.6 (L) 13.0 - 17.0 g/dL   HCT 35.5 (L) 39.0 - 52.0 %   MCV 97.5 78.0 - 100.0 fL   MCH 31.9 26.0 - 34.0 pg   MCHC 32.7 30.0 - 36.0 g/dL   RDW 13.2 11.5 - 15.5 %   Platelets 154 150 - 400 K/uL  Hemoglobin A1c     Status: None   Collection Time: 08/05/18 10:04 AM  Result Value Ref Range   Hgb A1c MFr Bld 5.6 4.8 - 5.6 %   Mean Plasma Glucose 114.02 mg/dL  Lipid panel     Status: Abnormal   Collection Time: 08/05/18 10:04 AM  Result Value Ref Range   Cholesterol 175 0 - 200 mg/dL   Triglycerides 167 (H) <150 mg/dL   HDL 38 (L) >40 mg/dL   Total CHOL/HDL Ratio 4.6 RATIO   VLDL 33 0 - 40 mg/dL   LDL Cholesterol 104 (H) 0 - 99 mg/dL   No results found.  Assessment/Plan: Diagnosis: right frontal and parietal lobe infacts Labs and images (see above) independently reviewed.  Records reviewed and summated above. Stroke: Continue secondary stroke prophylaxis and Risk Factor Modification listed below:   Antiplatelet therapy:  Blood Pressure Management:  Continue current medication with prn's with permisive HTN per primary team Statin Agent:   1. Does the need for close, 24 hr/day medical supervision in concert with the patient's rehab needs make it unreasonable for this patient to be served in a less intensive setting? Potentially  2. Co-Morbidities requiring supervision/potential complications: hypokalemia (continue to monitor and replete as necessary), HTN (monitor and provide prns in accordance with increased physical exertion and pain), AKI (avoid nephrotoxic meds), ?ABLA (transfuse if necessary to ensure appropriate perfusion for increased activity tolerance) 3. Due to safety, disease management and  patient education, does the patient require 24 hr/day rehab nursing? Potentially 4. Does the patient require coordinated care of a physician, rehab nurse, PT (1-2 hrs/day, 5 days/week) and OT (1-2 hrs/day, 5 days/week) to address physical and functional deficits in the context of the above medical diagnosis(es)? Potentially Addressing deficits in the following areas: balance, endurance, locomotion, strength, bathing, dressing, cognition and psychosocial support 5. Can the patient actively participate in an intensive therapy program of at least 3 hrs of therapy per day at least 5 days per week? Yes 6. The potential for patient to make measurable gains while on inpatient rehab is good 7. Anticipated functional outcomes upon discharge from inpatient rehab are modified independent  with PT, modified independent with OT, modified independent with SLP. 8. Estimated rehab length of stay to reach the above functional goals is: 3-5 days. 9. Anticipated D/C setting: Home 10. Anticipated post D/C treatments: HH therapy and Home excercise program 11. Overall Rehab/Functional Prognosis: good  RECOMMENDATIONS: This patient's condition is appropriate for continued rehabilitative care in the following setting: Will await for completion of therapy eval, however, pt will likely not require CIR. Recommend home with  HH at present after completion of medical workup. Patient has agreed to participate in recommended program. Potentially Note that insurance prior authorization may be required for reimbursement for recommended care.  Comment: Rehab Admissions Coordinator to follow up.   I have personally performed a face to face diagnostic evaluation, including, but not limited to relevant history and physical exam findings, of this patient and developed relevant assessment and plan.  Additionally, I have reviewed and concur with the physician assistant's documentation above.   Delice Lesch, MD, ABPMR Lavon Paganini Angiulli,  PA-C 08/05/2018

## 2018-08-05 NOTE — Progress Notes (Signed)
Rehab Admissions Coordinator Note:  Per OT and SLP recommendation, Patient was screened by Jhonnie Garner for appropriateness for an Inpatient Acute Rehab Consult.  At this time, we are recommending Inpatient Rehab consult. AC will contact MD regarding IP Rehab Consult Order.  Jhonnie Garner 08/05/2018, 1:02 PM  I can be reached at [phone].

## 2018-08-05 NOTE — Consult Note (Addendum)
Neurology Consultation Reason for Consult: CVA  Referring Physician: Dr. Broadus John   CC: vision loss   History is obtained from: the patient and the chart   HPI: Douglas Fowler is a 52 y.o. male with PMH CAD, CVA, HTN, HLD, tobacco use who presented with constant and progressive vision loss for the past 2 weeks.  The symptoms began while he was walking around in the grocery store and noted that he couldn't read the wording on food labels, it progressed to him being unable to read the microwave two feet in front of him. At times in the past weeks he has walked into things, he attributes this to not being able to see what was in front of him. He denies new weakness, numbness, or tingling. He denies headache, jaw claudication, or proximal muscle weakness.  He has a history of CVA in the past, in 2017 he developed slurred speech and was told that he had three strokes which were the cause of his symptoms, the slurred speech resolved and he was not left with any neurologic deficit.  He notes that he has been off of his antihypertensive and lipid lowering medications for months. He was initially seen at [facility] where a CT revealed a new right frontal and parietal lobe density concerning for new ischemic stroke.   LKW: 2 weeks prior  tpa given?: no, outside of window   ROS: A 14 point ROS was performed and is negative except as noted in the HPI.  PMH: CAD, CVA, HTN, HLD, tobacco use, alcohol use, ?prostate and kidney cancer    Family history- father has prostate cancer, depression, dementia  Mother- Hypertension, diabetes  Brother - cardiac disease   Social History:Smokes 1 PPD daily, has smoked since age 61 years old and at times he smoked up to two packs per day. 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 Uses Marijuana on ocassion, denies other drug use.    Exam: Current vital signs: BP (!) 170/99 (BP Location: Left Arm)   Pulse 74   Temp 98 F (36.7 C) (Oral)   Resp  18   Ht 5\' 6"  (1.676 m)   Wt 80.8 kg   SpO2 100%   BMI 28.75 kg/m  Vital signs in last 24 hours: Temp:  [97.8 F (36.6 C)-98 F (36.7 C)] 98 F (36.7 C) (09/04 1234) Pulse Rate:  [68-83] 74 (09/04 1234) Resp:  [11-24] 18 (09/04 1234) BP: (157-199)/(70-103) 170/99 (09/04 1234) SpO2:  [95 %-100 %] 100 % (09/04 1234) Weight:  [80.8 kg] 80.8 kg (09/03 2043)   Physical Exam  Constitutional: Appears well-developed and well-nourished.  Psych: Affect appropriate to situation Eyes: No scleral injection HENT: No OP obstrucion Head: Normocephalic.  Cardiovascular: Normal rate and regular rhythm.  Respiratory: Effort normal, non-labored breathing GI: Soft.  No distension. There is no tenderness.  Skin: WDI  Neuro: Mental Status: Patient is awake, alert, oriented to person, place, month, year, and situation. Patient is able to give a clear and coherent history. No signs of aphasia or neglect Cranial Nerves: II: Right eye vision 20/100, left eye can count fingers two feet away, pupils are 18mm, equal, round, and reactive to light.  III,IV, VI: EOMI without ptosis or diploplia.  V: Facial sensation is symmetric to light touch VII: Facial movement is symmetric.  VIII: hearing is intact to voice X: Uvula elevates symmetrically XI: Shoulder shrug is symmetric. XII: tongue is midline without atrophy or fasciculations.  Motor: Tone  is normal. Bulk is normal. 5/5 strength was present in all four extremities.  Sensory: Sensation is symmetric to light touch in the arms and legs. Deep Tendon Reflexes: 2+ and symmetric in the biceps and patellae.  Plantars: Toes are downgoing bilaterally. Cerebellar: FNF slowed on the left, left slower than right with testing of rapid alternating movement, HKS intact bilaterally   I have reviewed labs in epic and the results pertinent to this consultation are: LDL 104, HDL 38  Crt 1.23  A1c 5.6   I have reviewed the images obtained: CT head was  performed at outside hospital was reviewed and showed a new right occipital infarction and remote right parietal and frontal infarctions which can be appreciated when compared to prior CT head in 2016.   Impression: New right occipital infarction seen on MRI correlates with current presentation. The parietal and frontal infarcts are consistent with watershed areas and there are multiple other infarcts most clearly evident in the left occipital lobe and cerebellum on MRI. The multiple territory infarctions are concerning for an embolic source. MRA was concerning for right MCA stenosis. Multiple risk factors for stroke including tobacco use and uncontrolled hypertension and hyperlipidemia in the setting of known cardiovascular disease.   Recommendations: 1) LDL 104, not taking prescribed statin at the time of presentation, agree with starting atorvastatin 80 mg daily, will need recheck lipid panel in 1 month with goal LDL < 70  2) allow for permissive hypertension, this may be needed for over 48 hours  3) agree with asa 325 for first dose then transition to aspirin 81 mg daily  4) Transthoracic echo did not reveal an embolic source, will need to proceed to transesophageal echo 5) Obtain carotid dopplers  6) Encourage tobacco cessation  7) Hemoglobin A1c 5.6, continue to monitor   Ledell Noss, MD PGY 3, Internal Medicine [place]   @MPKSIGN @

## 2018-08-05 NOTE — Progress Notes (Signed)
PT Cancellation Note  Patient Details Name: Douglas Fowler MRN: [id number] DOB: Sep 16, 1966   Cancelled Treatment:    Reason Eval/Treat Not Completed: Patient at procedure or test/unavailable   Duncan Dull 08/05/2018, 10:39 AM  Alben Deeds, PT DPT  Board Certified Neurologic Specialist [phone]

## 2018-08-05 NOTE — Evaluation (Signed)
Speech Language Pathology Evaluation Patient Details Name: Douglas Fowler MRN: [id number] DOB: December 01, 1966 Today's Date: 08/05/2018 Time: 1020-1040 SLP Time Calculation (min) (ACUTE ONLY): 20 min  Problem List:  Patient Active Problem List   Diagnosis Date Noted  . CVA (cerebral vascular accident) ([place]) 08/04/2018   Past Medical History: History reviewed. No pertinent past medical history. Past Surgical History: History reviewed. No pertinent surgical history. HPI:  Douglas Fowler is a 52 y.o. male with medical history significant of coronary artery disease, hypertension, dyslipidemia and prior strokes who presents with episodic visual loss and associated bilateral lower extremity weakness with ambulatory dysfunction and disbalance, no improving or worsening factors.  Apparently he stopped taking his antihypertensives and antihyperlipidemic medications about 7 months ago.  MRI is pending   Assessment / Plan / Recommendation Clinical Impression   Pt presents with slowed processing, decreased retrieval of information, decreased scanning to the left of midline (?field cut versus inattention given pt could spontaneously turn his head to locate items on the left when therapist stood on his left side), decreased problem solving, and decreased emergent awareness of his deficits.  As a result, pt needed min-mod assist to complete evaluation tasks.  Pt would benefit from skilled ST while inpatient in order to maximize functional independence and reduce burden of care prior to discharge.  Pt may benefit from follow up services at CIR level pending results of MRI and PT/OT evaluations.      SLP Assessment  SLP Recommendation/Assessment: Patient needs continued Speech Lanaguage Pathology Services SLP Visit Diagnosis: Cognitive communication deficit (R41.841)    Follow Up Recommendations  Inpatient Rehab    Frequency and Duration min 2x/week         SLP Evaluation Cognition  Overall Cognitive  Status: Impaired/Different from baseline Arousal/Alertness: Lethargic Orientation Level: Oriented X4 Attention: Sustained Sustained Attention: Appears intact Memory: Impaired Memory Impairment: Retrieval deficit Awareness: Impaired Awareness Impairment: Emergent impairment Problem Solving: Impaired Problem Solving Impairment: Functional complex Executive Function: Self Monitoring;Self Correcting Self Monitoring: Impaired Self Monitoring Impairment: Functional complex Self Correcting: Impaired Self Correcting Impairment: Functional complex Safety/Judgment: Impaired Comments: decreased visual scanning to the left of midline but would spontaneously look to the left when therapist was standing on his left, also complaints of blurred vision       Comprehension  Auditory Comprehension Overall Auditory Comprehension: Appears within functional limits for tasks assessed    Expression Expression Primary Mode of Expression: Verbal Verbal Expression Overall Verbal Expression: Appears within functional limits for tasks assessed   Oral / Motor  Oral Motor/Sensory Function Overall Oral Motor/Sensory Function: Within functional limits Motor Speech Overall Motor Speech: Appears within functional limits for tasks assessed   GO                    Emilio Math 08/05/2018, 10:51 AM

## 2018-08-05 NOTE — Progress Notes (Addendum)
PROGRESS NOTE    Douglas Fowler  NLZ:[id number] DOB: 28-Feb-1966 DOA: 08/04/2018 PCP: Imagene Riches, NP  Brief Narrative: 52 year old male with history of CAD, hypertension, dyslipidemia, tobacco abuse, prior strokes, who stopped taking all his medications presented to the ER at [facility] yesterday with ongoing vision loss, double vision for 1-2 weeks, recommended to go to the ER by his PCP whom he saw yesterday. -Patient is a very poor historian, CT of the head showed new right frontal and parietal lobe densities, MRI pending   Assessment & Plan:   Principal Problem:   CVA (cerebral vascular accident) ([place]) -CT brain at outside hospital, concerning for right frontal and parietal lobe infarcts which I dont think should cause visual symptoms -pt noted to have diplopia and left visual field deficits -Continue aspirin and atorvastatin -Follow-up MRI, MRA brain -Follow-up carotid duplex and 2-D echocardiogram -LDL is 104, insulin A1c is 5.6 -Patient extremely noncompliant -PT OT, SLP evaluations pending -Neurology consult requested  Hypertension -Systolic blood pressure in the 160-170 range -We'll start low-dose beta blockers tomorrow  History of CAD -Wife reports 3 stents approximately 5 years ago -Does not take any medications for CAD at this time -Follow-up echo, continue aspirin, add beta blocker tomorrow, started statin  Tobacco abuse -counseled, nicotine patch  noncompliance   DVT prophylaxis:lovenox  Code Status: full code Family Communication: wife at bedside Disposition Plan: home pending above workup  Consultants:   Neuro   Procedures:   Antimicrobials:    Subjective: -continues to have vision problems and double vision  Objective: Vitals:   08/05/18 0004 08/05/18 0312 08/05/18 0800 08/05/18 1234  BP: (!) 157/92 (!) 178/74 (!) 166/70 (!) 170/99  Pulse: 83 71 80 74  Resp: (!) 24 12  18   Temp:   97.9 F (36.6 C) 98 F (36.7 C)  TempSrc:    Oral Oral  SpO2: 98% 95% 100% 100%  Weight:      Height:        Intake/Output Summary (Last 24 hours) at 08/05/2018 1356 Last data filed at 08/05/2018 1100 Gross per 24 hour  Intake 240 ml  Output 1200 ml  Net -960 ml   Filed Weights   08/04/18 2043  Weight: 80.8 kg    Examination:  General exam: chronically ill-appearing male, appears much older than stated age Respiratory system: poor air movement, clear bilaterally Cardiovascular system: S1 & S2 heard, RRR.  Gastrointestinal system: Abdomen is nondistended, soft and nontender.Normal bowel sounds heard. Central nervous system: Awake, alert, cranial nerves intact, motor 5/5, light touch intact, left visual field deficits noted below Extremities: no edema Skin: No rashes Psychiatry: Judgement and insight appear normal. Mood & affect appropriate.     Data Reviewed:   CBC: Recent Labs  Lab 08/04/18 2211 08/05/18 1004  WBC 8.6 7.6  HGB 11.8* 11.6*  HCT 36.0* 35.5*  MCV 97.6 97.5  PLT 156 902   Basic Metabolic Panel: Recent Labs  Lab 08/04/18 2211 08/05/18 1004  NA  --  140  K  --  3.3*  CL  --  106  CO2  --  26  GLUCOSE  --  119*  BUN  --  14  CREATININE 1.32* 1.23  CALCIUM  --  8.8*   GFR: Estimated Creatinine Clearance: 68.6 mL/min (by C-G formula based on SCr of 1.23 mg/dL). Liver Function Tests: No results for input(s): AST, ALT, ALKPHOS, BILITOT, PROT, ALBUMIN in the last 168 hours. No results for input(s): LIPASE, AMYLASE in  the last 168 hours. No results for input(s): AMMONIA in the last 168 hours. Coagulation Profile: No results for input(s): INR, PROTIME in the last 168 hours. Cardiac Enzymes: No results for input(s): CKTOTAL, CKMB, CKMBINDEX, TROPONINI in the last 168 hours. BNP (last 3 results) No results for input(s): PROBNP in the last 8760 hours. HbA1C: Recent Labs    08/05/18 1004  HGBA1C 5.6   CBG: No results for input(s): GLUCAP in the last 168 hours. Lipid Profile: Recent Labs      08/05/18 1004  CHOL 175  HDL 38*  LDLCALC 104*  TRIG 167*  CHOLHDL 4.6   Thyroid Function Tests: No results for input(s): TSH, T4TOTAL, FREET4, T3FREE, THYROIDAB in the last 72 hours. Anemia Panel: No results for input(s): VITAMINB12, FOLATE, FERRITIN, TIBC, IRON, RETICCTPCT in the last 72 hours. Urine analysis: No results found for: COLORURINE, APPEARANCEUR, LABSPEC, PHURINE, GLUCOSEU, HGBUR, BILIRUBINUR, KETONESUR, PROTEINUR, UROBILINOGEN, NITRITE, LEUKOCYTESUR Sepsis Labs: @LABRCNTIP (procalcitonin:4,lacticidven:4)  )No results found for this or any previous visit (from the past 240 hour(s)).       Radiology Studies: No results found.      Scheduled Meds: . aspirin  300 mg Rectal Daily   Or  . aspirin  325 mg Oral Daily  . atorvastatin  80 mg Oral q1800  . enoxaparin (LOVENOX) injection  40 mg Subcutaneous Q24H  . hydrALAZINE  10 mg Intravenous Once   Continuous Infusions:   LOS: 1 day    Time spent: 94min    Domenic Polite, MD [facility] Page via [email or web address], password [email or web address] After 7PM please contact night-coverage  08/05/2018, 1:56 PM

## 2018-08-05 NOTE — Evaluation (Addendum)
Physical Therapy Evaluation Patient Details Name: Douglas Fowler MRN: [id number] DOB: 11-27-1966 Today's Date: 08/05/2018   History of Present Illness  Douglas Fowler is a 52 y.o. male PMH including CAD, HTN, dyslipidemia and prior strokes who presents with episodic visual loss associated with bilateral lower extremity weakness with ambulatory dysfunction and disbalance. CT of the head showed new right frontal and parietal lobe densities concerning for new ischemic stroke. MRI pending.  Clinical Impression  Orders received for PT evaluation. Patient demonstrates deficits in functional mobility as indicated below. Will benefit from continued skilled PT to address deficits and maximize function. Will see as indicated and progress as tolerated.  Prior to admission patient was independent with all activities, currently patient does require hands on physical assist due to visual deficits, cognitive impairments, and no knowledge of compensatory strategies. Feel patient will need comprehensive therapies with focus on education and compensatory training to return home safely. At this time, recommending CIR consult.    Follow Up Recommendations CIR    Equipment Recommendations  None recommended by PT    Recommendations for Other Services       Precautions / Restrictions Precautions Precautions: Fall Precaution Comments: left inattention? visual deficits Restrictions Weight Bearing Restrictions: No      Mobility  Bed Mobility Overal bed mobility: Needs Assistance Bed Mobility: Supine to Sit;Sit to Supine     Supine to sit: Supervision Sit to supine: Supervision   General bed mobility comments: use of rail to assist  Transfers Overall transfer level: Needs assistance Equipment used: None Transfers: Sit to/from Stand Sit to Stand: Min guard         General transfer comment: min guard for balance  Ambulation/Gait Ambulation/Gait assistance: Min assist Gait Distance (Feet): (12  ft in room, with 2 stops to redirect to door, hallway ambulation 70 ft to End of hall with assist and 3 stops for assist with navigation, rest break with cues for visual scanning and tactile navigation cues prior to returning 80 ft to room with continued hands on min assist)  Assistive device: 1 person hand held assist Gait Pattern/deviations: Step-through pattern;Decreased stride length Gait velocity: decreased   General Gait Details: patient unable to safely ambulate without physical assist due to visual deficts. Reuiqred both tactile cues for guidence as well as VCs for specif step direction and navigation.   Stairs            Wheelchair Mobility    Modified Rankin (Stroke Patients Only) Modified Rankin (Stroke Patients Only) Pre-Morbid Rankin Score: No symptoms Modified Rankin: Moderately severe disability     Balance Overall balance assessment: Needs assistance Sitting-balance support: No upper extremity supported Sitting balance-Leahy Scale: Normal     Standing balance support: No upper extremity supported Standing balance-Leahy Scale: Fair                               Pertinent Vitals/Pain Pain Assessment: 0-10 Pain Score: 4  Pain Location: headache Pain Descriptors / Indicators: Headache Pain Intervention(s): Monitored during session    Home Living Family/patient expects to be discharged to:: Private residence Living Arrangements: Spouse/significant other Available Help at Discharge: Family;Available PRN/intermittently Type of Home: Apartment Home Access: Level entry     Home Layout: One level Home Equipment: Grab bars - tub/shower      Prior Function Level of Independence: Independent         Comments: retired Building control surveyor, swims daily, manages household  IADL and finances while wife is at work     Hand Dominance   Dominant Hand: Right    Extremity/Trunk Assessment   Upper Extremity Assessment Upper Extremity Assessment: Overall WFL  for tasks assessed    Lower Extremity Assessment Lower Extremity Assessment: Overall WFL for tasks assessed       Communication   Communication: No difficulties  Cognition Arousal/Alertness: Awake/alert Behavior During Therapy: WFL for tasks assessed/performed Overall Cognitive Status: Impaired/Different from baseline Area of Impairment: Following commands;Safety/judgement;Problem solving                       Following Commands: Follows one step commands with increased time Safety/Judgement: Decreased awareness of deficits   Problem Solving: Slow processing;Decreased initiation;Difficulty sequencing;Requires verbal cues;Requires tactile cues General Comments: patient with poor ability to follow 2-3 step commands consistently. Increased multi modal cues for safety and attention to task performance.       General Comments      Exercises     Assessment/Plan    PT Assessment Patient needs continued PT services  PT Problem List Decreased activity tolerance;Decreased balance;Decreased coordination;Decreased cognition       PT Treatment Interventions DME instruction;Gait training;Functional mobility training;Therapeutic activities;Therapeutic exercise;Balance training;Neuromuscular re-education;Cognitive remediation;Patient/family education    PT Goals (Current goals can be found in the Care Plan section)  Acute Rehab PT Goals Patient Stated Goal: to be able to see PT Goal Formulation: With patient Time For Goal Achievement: 08/19/18 Potential to Achieve Goals: Good Additional Goals Additional Goal #1: will perform in room navigation with supervision    Frequency Min 3X/week   Barriers to discharge        Co-evaluation               AM-PAC PT "6 Clicks" Daily Activity  Outcome Measure Difficulty turning over in bed (including adjusting bedclothes, sheets and blankets)?: A Little Difficulty moving from lying on back to sitting on the side of the bed?  : A Little Difficulty sitting down on and standing up from a chair with arms (e.g., wheelchair, bedside commode, etc,.)?: A Little Help needed moving to and from a bed to chair (including a wheelchair)?: A Little Help needed walking in hospital room?: A Lot Help needed climbing 3-5 steps with a railing? : A Lot 6 Click Score: 16    End of Session Equipment Utilized During Treatment: Gait belt Activity Tolerance: Patient tolerated treatment well Patient left: in bed;with call bell/phone within reach;with bed alarm set Nurse Communication: Mobility status PT Visit Diagnosis: Difficulty in walking, not elsewhere classified (R26.2);Other symptoms and signs involving the nervous system (R29.898)    Time: 4010-2725 PT Time Calculation (min) (ACUTE ONLY): 21 min   Charges:   PT Evaluation $PT Eval Moderate Complexity: 1 Mod          Alben Deeds, PT DPT  Board Certified Neurologic Specialist [place] 08/05/2018, 3:36 PM

## 2018-08-06 ENCOUNTER — Inpatient Hospital Stay (HOSPITAL_COMMUNITY): Payer: Medicare HMO

## 2018-08-06 ENCOUNTER — Encounter (HOSPITAL_COMMUNITY): Payer: Self-pay | Admitting: Radiology

## 2018-08-06 DIAGNOSIS — I6300 Cerebral infarction due to thrombosis of unspecified precerebral artery: Secondary | ICD-10-CM

## 2018-08-06 DIAGNOSIS — I639 Cerebral infarction, unspecified: Secondary | ICD-10-CM

## 2018-08-06 MED ORDER — IOPAMIDOL (ISOVUE-370) INJECTION 76%
50.0000 mL | Freq: Once | INTRAVENOUS | Status: AC | PRN
  Administered 2018-08-06: 50 mL via INTRAVENOUS

## 2018-08-06 MED ORDER — METOCLOPRAMIDE HCL 5 MG/ML IJ SOLN
10.0000 mg | Freq: Once | INTRAMUSCULAR | Status: AC
  Administered 2018-08-06: 10 mg via INTRAVENOUS
  Filled 2018-08-06: qty 2

## 2018-08-06 MED ORDER — NICOTINE 14 MG/24HR TD PT24
14.0000 mg | MEDICATED_PATCH | Freq: Every day | TRANSDERMAL | Status: DC
  Administered 2018-08-06 – 2018-08-07 (×2): 14 mg via TRANSDERMAL
  Filled 2018-08-06 (×2): qty 1

## 2018-08-06 MED ORDER — KETOROLAC TROMETHAMINE 15 MG/ML IJ SOLN
15.0000 mg | Freq: Once | INTRAMUSCULAR | Status: AC
  Administered 2018-08-06: 15 mg via INTRAVENOUS
  Filled 2018-08-06: qty 1

## 2018-08-06 MED ORDER — DIPHENHYDRAMINE HCL 50 MG/ML IJ SOLN
25.0000 mg | Freq: Once | INTRAMUSCULAR | Status: AC
  Administered 2018-08-06: 25 mg via INTRAVENOUS
  Filled 2018-08-06: qty 1

## 2018-08-06 NOTE — Progress Notes (Signed)
Inpatient Rehabilitation Admissions Coordinator  I met with patient at bedside to discus his preference for rehab venue. He states he would prefer to d/c home when medical workup is complete. He states he is disabled form heart issues and he is bipolar. States he drinks liquor daily and has experienced DT's twice in his life. He states he has not been taking his BP meds for he takes too many pills. He does report use of bipolar meds. I have notified RN, Havy of pt's history of bipolar on meds as well as his daily alcohol use. I await further progress with therapy but likely d/c home with [facility].  Danne Baxter, RN, MSN Rehab Admissions Coordinator [phone] 08/06/2018 10:30 AM

## 2018-08-06 NOTE — Progress Notes (Signed)
Occupational Therapy Treatment Patient Details Name: Douglas Fowler MRN: [id number] DOB: 17-Jul-1966 Today's Date: 08/06/2018    History of present illness ANEUDY CHAMPLAIN is a 52 y.o. male PMH including CAD, HTN, dyslipidemia and prior strokes who presents with episodic visual loss associated with bilateral lower extremity weakness with ambulatory dysfunction and disbalance. CT of the head showed new right frontal and parietal lobe densities concerning for new ischemic stroke.    OT comments  Pt presents supine in bed agreeable to therapy session. Pt continues to demonstrate significant visual deficits impacting his overall functional performance. Pt completing functional mobility without AD and minA though requires consistent mod-max multimodal cues for locating ADL items and navigating room/hallway. Initiated education regarding visual compensatory strategies during functional tasks completion, pt will benefit from continued review. Continue to recommend CIR level services at time of discharge to progress pt towards PLOF. Will continue to follow acutely.    Follow Up Recommendations  CIR;Supervision/Assistance - 24 hour    Equipment Recommendations  Tub/shower seat          Precautions / Restrictions Precautions Precautions: Fall Precaution Comments: left inattention? visual deficits Restrictions Weight Bearing Restrictions: No       Mobility Bed Mobility Overal bed mobility: Needs Assistance Bed Mobility: Supine to Sit;Sit to Supine     Supine to sit: Supervision Sit to supine: Supervision   General bed mobility comments: for safety  Transfers Overall transfer level: Needs assistance Equipment used: None Transfers: Sit to/from Stand Sit to Stand: Supervision         General transfer comment: Supervision for safety.     Balance Overall balance assessment: Needs assistance Sitting-balance support: Feet supported;No upper extremity supported Sitting balance-Leahy  Scale: Normal     Standing balance support: During functional activity Standing balance-Leahy Scale: Fair                             ADL either performed or assessed with clinical judgement   ADL Overall ADL's : Needs assistance/impaired     Grooming: Wash/dry hands;Oral care;Min guard;Minimal assistance;Standing Grooming Details (indicate cue type and reason): mod A to find items on the left side of sink                 Toilet Transfer: Minimal assistance;Ambulation;Regular Toilet;Grab bars Toilet Transfer Details (indicate cue type and reason): minA for balance, mod cues for environment navigation Toileting- Water quality scientist and Hygiene: Min guard;Sit to/from stand       Functional mobility during ADLs: Minimal assistance;Cueing for safety General ADL Comments: focus of session on vision compensatory strategies, pt rquiring mod-max cues throughout session for locating items and navigating room and hallway     Vision   Additional Comments: pt continues to require mod-max multimodal cues for locating items and navigating spaces, both in L and R visual fields, once able to locate items pt reports they are "blurry" ; began education regarding compensatory strategies including "lighthouse" scanning method with pt requiring cues/assist to complete; issued pt services for the blind handout   Perception     Praxis      Cognition Arousal/Alertness: Awake/alert Behavior During Therapy: WFL for tasks assessed/performed Overall Cognitive Status: Impaired/Different from baseline Area of Impairment: Following commands;Safety/judgement;Problem solving                       Following Commands: Follows one step commands with increased time Safety/Judgement: Decreased awareness of  deficits   Problem Solving: Slow processing;Decreased initiation;Difficulty sequencing;Requires verbal cues;Requires tactile cues General Comments: pt requires Increased multi  modal cues for safety and attention to task performance. Left inattention, visual deficits. requires cues for implementing visual compensatory strategies         Exercises     Shoulder Instructions       General Comments      Pertinent Vitals/ Pain       Pain Assessment: Faces Faces Pain Scale: Hurts a little bit Pain Location: "eye sockets" Pain Descriptors / Indicators: Sore;Other (Comment)(straining) Pain Intervention(s): Limited activity within patient's tolerance;Monitored during session  [place] expects to be discharged to:: Private residence Living Arrangements: Spouse/significant other                                      Prior Functioning/Environment              Frequency  Min 3X/week        Progress Toward Goals  OT Goals(current goals can now be found in the care plan section)  Progress towards OT goals: Progressing toward goals  Acute Rehab OT Goals Patient Stated Goal: to be able to see OT Goal Formulation: With patient Time For Goal Achievement: 08/19/18 Potential to Achieve Goals: Good  Plan Discharge plan remains appropriate    Co-evaluation                 AM-PAC PT "6 Clicks" Daily Activity     Outcome Measure   Help from another person eating meals?: A Lot Help from another person taking care of personal grooming?: A Lot Help from another person toileting, which includes using toliet, bedpan, or urinal?: A Little Help from another person bathing (including washing, rinsing, drying)?: A Little Help from another person to put on and taking off regular upper body clothing?: A Little Help from another person to put on and taking off regular lower body clothing?: A Little 6 Click Score: 16    End of Session Equipment Utilized During Treatment: Gait belt  OT Visit Diagnosis: Unsteadiness on feet (R26.81);Other abnormalities of gait and mobility (R26.89);Repeated falls (R29.6);Other symptoms and  signs involving cognitive function;Low vision, both eyes (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

## 2018-08-06 NOTE — Plan of Care (Signed)
  Problem: Education: Goal: Knowledge of General Education information will improve Description Including pain rating scale, medication(s)/side effects and non-pharmacologic comfort measures Outcome: Completed/Met   Problem: Health Behavior/Discharge Planning: Goal: Ability to manage health-related needs will improve Outcome: Completed/Met   Problem: Activity: Goal: Risk for activity intolerance will decrease Outcome: Completed/Met   Problem: Coping: Goal: Level of anxiety will decrease Outcome: Completed/Met   Problem: Safety: Goal: Ability to remain free from injury will improve Outcome: Completed/Met

## 2018-08-06 NOTE — Progress Notes (Signed)
Physical Therapy Treatment Patient Details Name: Douglas Fowler MRN: [id number] DOB: Jun 04, 1966 Today's Date: 08/06/2018    History of Present Illness Douglas Fowler is a 52 y.o. male PMH including CAD, HTN, dyslipidemia and prior strokes who presents with episodic visual loss associated with bilateral lower extremity weakness with ambulatory dysfunction and disbalance. CT of the head showed new right frontal and parietal lobe densities concerning for new ischemic stroke.     PT Comments    Patient continues to require Min A for all mobility with max directional cues due to visual deficits, inattention and cognitive impairments. Despite education, pt has no knowledge or ability to use compensatory strategies. Difficulty scanning environment causing pt to run into things on right and left side. Not able to find his way back to this room despite max cues. Requires assist with visual scanning to navigate hallways. Not able to dual task during mobility. Pt tearful upon returning to room. Feel patient will need comprehensive therapies with focus on education and compensatory training to return home safely. Will follow.   Follow Up Recommendations  CIR     Equipment Recommendations  None recommended by PT    Recommendations for Other Services       Precautions / Restrictions Precautions Precautions: Fall Precaution Comments: left inattention? visual deficits Restrictions Weight Bearing Restrictions: No    Mobility  Bed Mobility Overal bed mobility: Needs Assistance Bed Mobility: Supine to Sit;Sit to Supine     Supine to sit: Supervision;HOB elevated Sit to supine: Supervision;HOB elevated   General bed mobility comments: use of rail to assist  Transfers Overall transfer level: Needs assistance Equipment used: None Transfers: Sit to/from Stand Sit to Stand: Supervision         General transfer comment: Supervision for safety.   Ambulation/Gait Ambulation/Gait assistance:  Min assist Gait Distance (Feet): 120 Feet Assistive device: None Gait Pattern/deviations: Step-through pattern;Decreased stride length;Staggering right;Staggering left Gait velocity: decreased   General Gait Details: Unable to safely ambulate without assist due to visual/attentional deficits. Difficulty following multi step directional cues. Running into objects on right. and left. Cannot find way out of room or back to room despite cues. Not able to dual task,   Stairs Stairs: Yes Stairs assistance: Min guard Stair Management: Step to pattern Number of Stairs: 3(+ 2 steps x2 bouts) General stair comments: Step by step cues for safety/technique. Trips up step.   Wheelchair Mobility    Modified Rankin (Stroke Patients Only) Modified Rankin (Stroke Patients Only) Pre-Morbid Rankin Score: No symptoms Modified Rankin: Moderately severe disability     Balance Overall balance assessment: Needs assistance Sitting-balance support: Feet supported;No upper extremity supported Sitting balance-Leahy Scale: Normal     Standing balance support: During functional activity Standing balance-Leahy Scale: Fair                              Cognition Arousal/Alertness: Awake/alert Behavior During Therapy: WFL for tasks assessed/performed Overall Cognitive Status: Impaired/Different from baseline Area of Impairment: Following commands;Safety/judgement;Problem solving                       Following Commands: Follows one step commands with increased time Safety/Judgement: Decreased awareness of deficits   Problem Solving: Slow processing;Decreased initiation;Difficulty sequencing;Requires verbal cues;Requires tactile cues General Comments: patient with poor ability to follow 2-3 step commands consistently. Increased multi modal cues for safety and attention to task performance. Left inattention, visual deficits.  Exercises      General Comments General comments  (skin integrity, edema, etc.): Tearful at end of session about current situation      Pertinent Vitals/Pain Pain Assessment: Faces Faces Pain Scale: Hurts little more Pain Location: headache Pain Descriptors / Indicators: Headache Pain Intervention(s): Monitored during session    Home Living                      Prior Function            PT Goals (current goals can now be found in the care plan section) Progress towards PT goals: Progressing toward goals(slowly)    Frequency    Min 3X/week      PT Plan Current plan remains appropriate    Co-evaluation              AM-PAC PT "6 Clicks" Daily Activity  Outcome Measure  Difficulty turning over in bed (including adjusting bedclothes, sheets and blankets)?: None Difficulty moving from lying on back to sitting on the side of the bed? : None Difficulty sitting down on and standing up from a chair with arms (e.g., wheelchair, bedside commode, etc,.)?: A Little Help needed moving to and from a bed to chair (including a wheelchair)?: A Little Help needed walking in hospital room?: A Lot Help needed climbing 3-5 steps with a railing? : A Lot 6 Click Score: 18    End of Session Equipment Utilized During Treatment: Gait belt Activity Tolerance: Patient tolerated treatment well Patient left: in bed;with call bell/phone within reach;with bed alarm set;with family/visitor present Nurse Communication: Mobility status PT Visit Diagnosis: Difficulty in walking, not elsewhere classified (R26.2);Other symptoms and signs involving the nervous system (R29.898)     Time: 8413-2440 PT Time Calculation (min) (ACUTE ONLY): 27 min  Charges:  $Neuromuscular Re-education: 23-37 mins                     Wray Kearns, PT, DPT Acute Rehabilitation Services Pager [phone] Office [place] 08/06/2018, 11:16 AM

## 2018-08-06 NOTE — Plan of Care (Signed)
  Problem: Activity: Goal: Risk for activity intolerance will decrease Outcome: Completed/Met   Problem: Education: Goal: Knowledge of General Education information will improve Description: Including pain rating scale, medication(s)/side effects and non-pharmacologic comfort measures Outcome: Completed/Met   

## 2018-08-06 NOTE — Progress Notes (Addendum)
PROGRESS NOTE    Douglas Fowler  VHQ:[id number] DOB: 01-Mar-1966 DOA: 08/04/2018 PCP: Imagene Riches, NP  Brief Narrative: 52 year old male with history of CAD, hypertension, dyslipidemia, tobacco abuse, prior strokes, who stopped taking all his medications presented to the ER at [facility] yesterday with ongoing vision loss, double vision for 1-2 weeks, recommended to go to the ER by his PCP whom he saw yesterday. -Patient is a very poor historian, CT of the head showed new right frontal and parietal lobe densities, MRI pending   Assessment & Plan:   Principal Problem:   CVA (cerebral vascular accident) ([place]) -CT brain at outside hospital, concerning for right frontal and parietal lobe infarcts -MRI brain noted Large acute/early subacute infarctions involving the right superior frontal lobe and the right parieto-occipital lobe, and diffuse scattered subcentimeter infarcts.  -pt noted to have diplopia and left visual field deficits -neurology consulting, underwent CTA head and neck yesterday which showed diffuse intra- Cranial atherosclerosis -Continue aspirin and atorvastatin -2-D echocardiogram with EF of 50-55% -LDL is 104,  hba1cA1c is 5.6 -Patient extremely noncompliant, stopped all his medications 7-8 months ago and continues to smoke -PT OT, SLP evaluations completed, CIR recommended however patient would rather discharge home, will set up home health services  Hypertension -Systolic blood pressure in the 160-170 range -start low-dose beta blockers at discharge  History of CAD -Wife reports 3 stents approximately 5 years ago -Does not take any medications for CAD at this time -continue aspirin, add beta blocker at discharge, started statin  R lung nodule -needs FU  Tobacco abuse -counseled, nicotine patch  noncompliance   DVT prophylaxis:lovenox  Code Status: full code Family Communication: wife at bedside Disposition Plan: home pending above  workup  Consultants:   Neuro   Procedures:   Antimicrobials:    Subjective: -continuous report mild right-sided weakness and double vision  Objective: Vitals:   08/06/18 0000 08/06/18 0448 08/06/18 0735 08/06/18 1250  BP: (!) 195/74 136/83 (!) 180/74 (!) 173/82  Pulse: 65 75 69 70  Resp: 18 18 18 18   Temp: 98.7 F (37.1 C) 98.5 F (36.9 C) 97.8 F (36.6 C) 97.7 F (36.5 C)  TempSrc: Axillary Oral Oral Oral  SpO2: 100% 99% 98% 98%  Weight:      Height:        Intake/Output Summary (Last 24 hours) at 08/06/2018 1335 Last data filed at 08/06/2018 0851 Gross per 24 hour  Intake 540 ml  Output -  Net 540 ml   Filed Weights   08/04/18 2043  Weight: 80.8 kg    Examination:  Gen: Awake, Alert, Oriented X 3, chronically ill-appearing male, appears much older than stated age HEENT: PERRLA, Neck supple, no JVD Lungs: poor air movement bilaterally CVS: RRR,No Gallops,Rubs or new Murmurs Abd: soft, Non tender, non distended, BS present Extremities: No Cyanosis, Clubbing or edema Skin: no new rashes CNS, alert awake, cranial nerves II through XII intact, motor 5 x 5 in upper and lower extremity groups, sensation slight touch intact, mild left visual field deficits noted Psychiatry: Judgement and insight appear normal. Mood & affect appropriate.     Data Reviewed:   CBC: Recent Labs  Lab 08/04/18 2211 08/05/18 1004  WBC 8.6 7.6  HGB 11.8* 11.6*  HCT 36.0* 35.5*  MCV 97.6 97.5  PLT 156 413   Basic Metabolic Panel: Recent Labs  Lab 08/04/18 2211 08/05/18 1004  NA  --  140  K  --  3.3*  CL  --  106  CO2  --  26  GLUCOSE  --  119*  BUN  --  14  CREATININE 1.32* 1.23  CALCIUM  --  8.8*   GFR: Estimated Creatinine Clearance: 68.6 mL/min (by C-G formula based on SCr of 1.23 mg/dL). Liver Function Tests: No results for input(s): AST, ALT, ALKPHOS, BILITOT, PROT, ALBUMIN in the last 168 hours. No results for input(s): LIPASE, AMYLASE in the last 168  hours. No results for input(s): AMMONIA in the last 168 hours. Coagulation Profile: No results for input(s): INR, PROTIME in the last 168 hours. Cardiac Enzymes: No results for input(s): CKTOTAL, CKMB, CKMBINDEX, TROPONINI in the last 168 hours. BNP (last 3 results) No results for input(s): PROBNP in the last 8760 hours. HbA1C: Recent Labs    08/05/18 1004  HGBA1C 5.6   CBG: No results for input(s): GLUCAP in the last 168 hours. Lipid Profile: Recent Labs    08/05/18 1004  CHOL 175  HDL 38*  LDLCALC 104*  TRIG 167*  CHOLHDL 4.6   Thyroid Function Tests: No results for input(s): TSH, T4TOTAL, FREET4, T3FREE, THYROIDAB in the last 72 hours. Anemia Panel: No results for input(s): VITAMINB12, FOLATE, FERRITIN, TIBC, IRON, RETICCTPCT in the last 72 hours. Urine analysis: No results found for: COLORURINE, APPEARANCEUR, LABSPEC, PHURINE, GLUCOSEU, HGBUR, BILIRUBINUR, KETONESUR, PROTEINUR, UROBILINOGEN, NITRITE, LEUKOCYTESUR Sepsis Labs: @LABRCNTIP (procalcitonin:4,lacticidven:4)  )No results found for this or any previous visit (from the past 240 hour(s)).       Radiology Studies: Ct Angio Head W Or Wo Contrast  Result Date: 08/06/2018 CLINICAL DATA:  Follow-up examination for acute stroke. EXAM: CT ANGIOGRAPHY HEAD AND NECK TECHNIQUE: Multidetector CT imaging of the head and neck was performed using the standard protocol during bolus administration of intravenous contrast. Multiplanar CT image reconstructions and MIPs were obtained to evaluate the vascular anatomy. Carotid stenosis measurements (when applicable) are obtained utilizing NASCET criteria, using the distal internal carotid diameter as the denominator. CONTRAST:  59mL ISOVUE-370 IOPAMIDOL (ISOVUE-370) INJECTION 76% COMPARISON:  Prior MRI from 08/05/2018 and CT from 08/04/2018. FINDINGS: CTA NECK FINDINGS Aortic arch: Visualized aortic arch of normal caliber with normal 3 vessel morphology. Mild to moderate atheromatous  plaque within the aortic arch and about the origin of the great vessels. Associated approximate 50% stenosis at the origin of the left common carotid artery. No other hemodynamically significant stenosis about the origin of the great vessels. There is a short-segment stenosis of approximately 60% at the proximal left subclavian artery beyond the takeoff of the left vertebral artery (series 7, image 290). Visualized subclavian arteries otherwise widely patent. Right carotid system: Scattered mixed a centric plaque within the right common carotid artery without hemodynamically significant stenosis. Mixed multifocal plaque about the right carotid bifurcation and proximal right ICA with relatively mild approximate 20-30% stenosis. Right ICA patent distally to the skull base without stenosis, dissection, or occlusion. Left carotid system: Approximate 50% stenosis at the origin of the left common carotid artery. Left CCA otherwise patent to the bifurcation without stenosis. Mixed multifocal plaque about the left bifurcation/proximal left ICA with associated stenosis of up to approximate 40% by NASCET criteria. Left ICA otherwise patent distally to the skull base without stenosis, dissection, or occlusion. Vertebral arteries: Both of the vertebral arteries arise from the subclavian arteries. Left vertebral artery is dominant focal non stenotic plaque noted within the pre foraminal left V1 segment. Left vertebral artery otherwise widely patent within the neck without stenosis, dissection, or occlusion. Right vertebral artery markedly diminutive and  hypoplastic in appearance, and is grossly patent within the neck, but essentially occludes as it courses into the cranial vault. Skeleton: No acute osseous abnormality. No discrete lytic or blastic osseous lesions. Reversal of the normal cervical lordosis with moderate cervical spondylolysis at C4-5 through C6-7. Poor dentition noted. Other neck: Soft tissues of the neck  demonstrate no acute finding. Salivary glands within normal limits. Thyroid normal. No adenopathy. 16 mm well-circumscribed soft tissue lesion within the subcutaneous fat at the right submandibular region noted (series 5, image 120), likely a sebaceous cyst. Upper chest: Visualized upper chest demonstrates no significant finding. Mild emphysema noted within the visualized lungs. 5 mm right upper lobe nodule (series 5, image 178), indeterminate. Subcentimeter calcified right upper lobe calcified granuloma noted. Review of the MIP images confirms the above findings CTA HEAD FINDINGS Anterior circulation: Petrous segments patent bilaterally without stenosis. Atheromatous plaque throughout the cavernous/supraclinoid ICAs with moderate to advanced multifocal narrowing, slightly worse on the left. Right ICA is patent to the terminus, with subsequent complete occlusion at the origin of the right MCA, stable from previous MRA. Right M1 segment and its distal branches appear completely occluded by CT a, with scant if any collateral flow seen distally within the right MCA distribution. Left M1 mildly irregular but patent without high-grade stenosis. Normal left MCA bifurcation. No proximal left M2 occlusion. Distal left MCA branches left MCA branches perfused to their distal aspects with diffuse small vessel atheromatous irregularity. A1 segments irregular but patent. Normal anterior communicating artery. A2 segments irregular but patent to their distal aspects without high-grade stenosis. Posterior circulation: Multifocal plaque with associated moderate approximate 50% stenosis within the dominant left V4 segment. Hypoplastic right vertebral artery essentially occluded at the skull base. Posterior inferior cerebral arteries not seen on this examination. Basilar patent to its distal aspect without stenosis. Superior cerebral arteries patent proximally. Both of the PCA supplied via the basilar. Moderate atheromatous  irregularity throughout the PCAs bilaterally without high-grade stenosis. PCAs are patent to their distal aspects. Venous sinuses: Not well assessed due to timing of the contrast bolus, but grossly patent. Anatomic variants: Hypoplastic right vertebral artery. Delayed phase: Scattered multifocal areas of serpiginous cortical enhancement seen at the areas of evolving infarction involving the right frontal and parietal lobes as well as the left occipital lobe, consistent with subacute infarcts. No other pathologic enhancement. Review of the MIP images confirms the above findings IMPRESSION: 1. 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 Little to no collateral flow seen distally within the right MCA territory by CTA. 2. 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 Moderate approximate 50% atheromatous stenosis at the left V4 segment. Dominant left vertebral artery and remainder the vertebrobasilar circulation otherwise patent. 3. Moderate carotid siphon atherosclerotic change with associated moderate to advanced multifocal stenoses. 4. Short-segment 50% stenosis at the origin of the left CCA. 5. Short-segment 60% stenosis involving the proximal left subclavian artery, distal to the origin of the left vertebral artery. 6. 16 mm soft tissue lesion involving the subcutaneous fat of the anterior right neck, indeterminate, but suspected to reflect a sebaceous cyst. Correlation with physical exam recommended. 7. Emphysema with 5 mm right upper lobe nodule, indeterminate. No follow-up needed if patient is low-risk. Non-contrast chest CT can be considered in 12 months if patient is high-risk. This recommendation follows the consensus statement: Guidelines for Management of Incidental Pulmonary Nodules Detected on CT Images: From the Fleischner Society 2017; Radiology 2017; 284:228-243. Electronically Signed   By:  Jeannine Boga M.D.   On: 08/06/2018 02:17   Ct Angio Neck W Or Wo  Contrast  Result Date: 08/06/2018 CLINICAL DATA:  Follow-up examination for acute stroke. EXAM: CT ANGIOGRAPHY HEAD AND NECK TECHNIQUE: Multidetector CT imaging of the head and neck was performed using the standard protocol during bolus administration of intravenous contrast. Multiplanar CT image reconstructions and MIPs were obtained to evaluate the vascular anatomy. Carotid stenosis measurements (when applicable) are obtained utilizing NASCET criteria, using the distal internal carotid diameter as the denominator. CONTRAST:  21mL ISOVUE-370 IOPAMIDOL (ISOVUE-370) INJECTION 76% COMPARISON:  Prior MRI from 08/05/2018 and CT from 08/04/2018. FINDINGS: CTA NECK FINDINGS Aortic arch: Visualized aortic arch of normal caliber with normal 3 vessel morphology. Mild to moderate atheromatous plaque within the aortic arch and about the origin of the great vessels. Associated approximate 50% stenosis at the origin of the left common carotid artery. No other hemodynamically significant stenosis about the origin of the great vessels. There is a short-segment stenosis of approximately 60% at the proximal left subclavian artery beyond the takeoff of the left vertebral artery (series 7, image 290). Visualized subclavian arteries otherwise widely patent. Right carotid system: Scattered mixed a centric plaque within the right common carotid artery without hemodynamically significant stenosis. Mixed multifocal plaque about the right carotid bifurcation and proximal right ICA with relatively mild approximate 20-30% stenosis. Right ICA patent distally to the skull base without stenosis, dissection, or occlusion. Left carotid system: Approximate 50% stenosis at the origin of the left common carotid artery. Left CCA otherwise patent to the bifurcation without stenosis. Mixed multifocal plaque about the left bifurcation/proximal left ICA with associated stenosis of up to approximate 40% by NASCET criteria. Left ICA otherwise patent  distally to the skull base without stenosis, dissection, or occlusion. Vertebral arteries: Both of the vertebral arteries arise from the subclavian arteries. Left vertebral artery is dominant focal non stenotic plaque noted within the pre foraminal left V1 segment. Left vertebral artery otherwise widely patent within the neck without stenosis, dissection, or occlusion. Right vertebral artery markedly diminutive and hypoplastic in appearance, and is grossly patent within the neck, but essentially occludes as it courses into the cranial vault. Skeleton: No acute osseous abnormality. No discrete lytic or blastic osseous lesions. Reversal of the normal cervical lordosis with moderate cervical spondylolysis at C4-5 through C6-7. Poor dentition noted. Other neck: Soft tissues of the neck demonstrate no acute finding. Salivary glands within normal limits. Thyroid normal. No adenopathy. 16 mm well-circumscribed soft tissue lesion within the subcutaneous fat at the right submandibular region noted (series 5, image 120), likely a sebaceous cyst. Upper chest: Visualized upper chest demonstrates no significant finding. Mild emphysema noted within the visualized lungs. 5 mm right upper lobe nodule (series 5, image 178), indeterminate. Subcentimeter calcified right upper lobe calcified granuloma noted. Review of the MIP images confirms the above findings CTA HEAD FINDINGS Anterior circulation: Petrous segments patent bilaterally without stenosis. Atheromatous plaque throughout the cavernous/supraclinoid ICAs with moderate to advanced multifocal narrowing, slightly worse on the left. Right ICA is patent to the terminus, with subsequent complete occlusion at the origin of the right MCA, stable from previous MRA. Right M1 segment and its distal branches appear completely occluded by CT a, with scant if any collateral flow seen distally within the right MCA distribution. Left M1 mildly irregular but patent without high-grade  stenosis. Normal left MCA bifurcation. No proximal left M2 occlusion. Distal left MCA branches left MCA branches perfused to their distal aspects  with diffuse small vessel atheromatous irregularity. A1 segments irregular but patent. Normal anterior communicating artery. A2 segments irregular but patent to their distal aspects without high-grade stenosis. Posterior circulation: Multifocal plaque with associated moderate approximate 50% stenosis within the dominant left V4 segment. Hypoplastic right vertebral artery essentially occluded at the skull base. Posterior inferior cerebral arteries not seen on this examination. Basilar patent to its distal aspect without stenosis. Superior cerebral arteries patent proximally. Both of the PCA supplied via the basilar. Moderate atheromatous irregularity throughout the PCAs bilaterally without high-grade stenosis. PCAs are patent to their distal aspects. Venous sinuses: Not well assessed due to timing of the contrast bolus, but grossly patent. Anatomic variants: Hypoplastic right vertebral artery. Delayed phase: Scattered multifocal areas of serpiginous cortical enhancement seen at the areas of evolving infarction involving the right frontal and parietal lobes as well as the left occipital lobe, consistent with subacute infarcts. No other pathologic enhancement. Review of the MIP images confirms the above findings IMPRESSION: 1. 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 Little to no collateral flow seen distally within the right MCA territory by CTA. 2. 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 Moderate approximate 50% atheromatous stenosis at the left V4 segment. Dominant left vertebral artery and remainder the vertebrobasilar circulation otherwise patent. 3. Moderate carotid siphon atherosclerotic change with associated moderate to advanced multifocal stenoses. 4. Short-segment 50% stenosis at the origin of the left CCA. 5.  Short-segment 60% stenosis involving the proximal left subclavian artery, distal to the origin of the left vertebral artery. 6. 16 mm soft tissue lesion involving the subcutaneous fat of the anterior right neck, indeterminate, but suspected to reflect a sebaceous cyst. Correlation with physical exam recommended. 7. Emphysema with 5 mm right upper lobe nodule, indeterminate. No follow-up needed if patient is low-risk. Non-contrast chest CT can be considered in 12 months if patient is high-risk. This recommendation follows the consensus statement: Guidelines for Management of Incidental Pulmonary Nodules Detected on CT Images: From the Fleischner Society 2017; Radiology 2017; 284:228-243. Electronically Signed   By: Jeannine Boga M.D.   On: 08/06/2018 02:17   Mr Virgel Paling KV Contrast  Result Date: 08/05/2018 CLINICAL DATA:  52 y/o M; constant and progressive vision loss for 2 weeks. EXAM: MRI HEAD WITHOUT CONTRAST MRA HEAD WITHOUT CONTRAST TECHNIQUE: Multiplanar, multiecho pulse sequences of the brain and surrounding structures were obtained without intravenous contrast. Angiographic images of the head were obtained using MRA technique without contrast. COMPARISON:  08/04/2018 CT head. 03/17/2015 MRI head. 03/17/2015 CT angiogram head and neck. FINDINGS: MRI HEAD FINDINGS Brain: Large areas of reduced diffusion compatible with acute/early subacute infarction in the right superior frontal lobe and right parietooccipital lobe as well as small areas of infarction scattered throughout the bilateral cerebral hemispheres and the left cerebellum. Areas of acute infarction demonstrate T2 FLAIR hyperintense signal abnormality. There is no associated susceptibility hypointensity to indicate acute intracranial hemorrhage. There multiple small chronic cortical infarctions predominantly in the right frontal and right parietal lobes with additional small foci in left parietal lobe and left frontal periventricular white  matter. There are a few areas of susceptibility hypointensity scattered throughout the brain predominantly posteriorly compatible hemosiderin deposition of chronic microhemorrhage. No herniation, extra-axial collection, hydrocephalus, or focal mass effect. Vascular: As below. Skull and upper cervical spine: Normal marrow signal. Sinuses/Orbits: Negative. Other: None. MRA HEAD FINDINGS Motion degraded study. Anterior circulation: Right proximal M1 occlusion is new from prior CT angiogram of the head. Bilateral  internal carotid arteries, bilateral ACA, and the left MCA are patent. No additional large vessel occlusion, segment of high-grade stenosis, or aneurysm identified. Atherosclerosis of the carotid siphons with mild bilateral stenosis. Posterior circulation: No large vessel occlusion, aneurysm, or significant stenosis is identified. Left dominant vertebrobasilar system as seen on prior CT angiogram. IMPRESSION: 1. Large acute/early subacute infarctions involving the right superior frontal lobe and the right parieto-occipital lobe. Several additional scattered subcentimeter foci of recent infarction throughout the cerebral hemispheres and the left cerebellum. No associated acute hemorrhage or mass effect. 2. Right M1 occlusion is new from 2016. 3. Several small chronic infarcts, the largest involving right frontal lobe and right parietal lobe. These results will be called to the ordering clinician or representative by the Radiologist Assistant, and communication documented in the PACS or zVision Dashboard. Electronically Signed   By: Kristine Garbe M.D.   On: 08/05/2018 16:54   Mr Brain Wo Contrast  Result Date: 08/05/2018 CLINICAL DATA:  52 y/o M; constant and progressive vision loss for 2 weeks. EXAM: MRI HEAD WITHOUT CONTRAST MRA HEAD WITHOUT CONTRAST TECHNIQUE: Multiplanar, multiecho pulse sequences of the brain and surrounding structures were obtained without intravenous contrast. Angiographic  images of the head were obtained using MRA technique without contrast. COMPARISON:  08/04/2018 CT head. 03/17/2015 MRI head. 03/17/2015 CT angiogram head and neck. FINDINGS: MRI HEAD FINDINGS Brain: Large areas of reduced diffusion compatible with acute/early subacute infarction in the right superior frontal lobe and right parietooccipital lobe as well as small areas of infarction scattered throughout the bilateral cerebral hemispheres and the left cerebellum. Areas of acute infarction demonstrate T2 FLAIR hyperintense signal abnormality. There is no associated susceptibility hypointensity to indicate acute intracranial hemorrhage. There multiple small chronic cortical infarctions predominantly in the right frontal and right parietal lobes with additional small foci in left parietal lobe and left frontal periventricular white matter. There are a few areas of susceptibility hypointensity scattered throughout the brain predominantly posteriorly compatible hemosiderin deposition of chronic microhemorrhage. No herniation, extra-axial collection, hydrocephalus, or focal mass effect. Vascular: As below. Skull and upper cervical spine: Normal marrow signal. Sinuses/Orbits: Negative. Other: None. MRA HEAD FINDINGS Motion degraded study. Anterior circulation: Right proximal M1 occlusion is new from prior CT angiogram of the head. Bilateral internal carotid arteries, bilateral ACA, and the left MCA are patent. No additional large vessel occlusion, segment of high-grade stenosis, or aneurysm identified. Atherosclerosis of the carotid siphons with mild bilateral stenosis. Posterior circulation: No large vessel occlusion, aneurysm, or significant stenosis is identified. Left dominant vertebrobasilar system as seen on prior CT angiogram. IMPRESSION: 1. Large acute/early subacute infarctions involving the right superior frontal lobe and the right parieto-occipital lobe. Several additional scattered subcentimeter foci of recent  infarction throughout the cerebral hemispheres and the left cerebellum. No associated acute hemorrhage or mass effect. 2. Right M1 occlusion is new from 2016. 3. Several small chronic infarcts, the largest involving right frontal lobe and right parietal lobe. These results will be called to the ordering clinician or representative by the Radiologist Assistant, and communication documented in the PACS or zVision Dashboard. Electronically Signed   By: Kristine Garbe M.D.   On: 08/05/2018 16:54        Scheduled Meds: . aspirin  300 mg Rectal Daily   Or  . aspirin  325 mg Oral Daily  . atorvastatin  80 mg Oral q1800  . enoxaparin (LOVENOX) injection  40 mg Subcutaneous Q24H  . hydrALAZINE  10 mg Intravenous Once  .  nicotine  14 mg Transdermal Daily   Continuous Infusions:   LOS: 2 days    Time spent: 5min    Domenic Polite, MD [facility] Page via [email or web address], password [email or web address] After 7PM please contact night-coverage  08/06/2018, 1:35 PM

## 2018-08-06 NOTE — Progress Notes (Signed)
Preliminary notes--Bilateral carotid duplex exam completed. Bilateral ICAs 1-39% stenosis.  Vertebrals:Left vertebral artery demonstrates antegrade flow. Right vertebral artery demonstrates bidirectional flow.  Left subclavian artery noticeable high velocity.  Hongying Sailors (RDMS RVT) 08/06/18 12:38 PM

## 2018-08-06 NOTE — Progress Notes (Signed)
  Speech Language Pathology Treatment: Cognitive-Linquistic  Patient Details Name: Douglas Fowler MRN: [id number] DOB: 1966-08-23 Today's Date: 08/06/2018 Time: 7371-0626 SLP Time Calculation (min) (ACUTE ONLY): 13 min  Assessment / Plan / Recommendation Clinical Impression  Pt was seen for skilled ST targeting cognitive goals.  Pt was in bed upon arrival, awake, alert, and agreeable to participating in [place].  SLP facilitated the session with a basic card game to address visual scanning.  Pt initially needed max to total cues for scanning to locate cards that were to the left of midline.  Pt would often turn his head to locate cards but eyes would stay fixed at midline or to the right and he was unable to find targeted cards.  However, after introducing the idea of using areas on his table tray as marginal anchors, SLP was able to fade cues to supervision for finding cards in fixed locations.  Therapist increased task challenge by moving cards to different areas of pt's visual field which resulted in pt needing mod-max cues to find cards.   Pt was tearful towards the end of therapy session.  SLP provided encouragement by offering examples of pt's progress.  Pt was left in bed with bed alarm set and call bell within reach.  Continue per current plan of care.    HPI HPI: Douglas Fowler is a 52 y.o. male with medical history significant of coronary artery disease, hypertension, dyslipidemia and prior strokes who presents with episodic visual loss and associated bilateral lower extremity weakness with ambulatory dysfunction and disbalance, no improving or worsening factors.  Apparently he stopped taking his antihypertensives and antihyperlipidemic medications about 7 months ago.  MRI is pending      SLP Plan  Continue with current plan of care       Recommendations                   Follow up Recommendations: Inpatient Rehab SLP Visit Diagnosis: Cognitive communication deficit (R48.546) Plan:  Continue with current plan of care       GO                Elowyn Raupp, Selinda Orion 08/06/2018, 3:21 PM

## 2018-08-06 NOTE — Progress Notes (Signed)
STROKE TEAM PROGRESS NOTE   HISTORY OF PRESENT ILLNESS (per record) Douglas Fowler is a 52 y.o. male with PMH CAD, CVA, HTN, HLD, tobacco use who presented with constant and progressive vision loss for the past 2 weeks.  The symptoms began while he was walking around in the grocery store and noted that he couldn't read the wording on food labels, it progressed to him being unable to read the microwave two feet in front of him. At times in the past weeks he has walked into things, he attributes this to not being able to see what was in front of him. He denies new weakness, numbness, or tingling. He denies headache, jaw claudication, or proximal muscle weakness.  He has a history of CVA in the past, in 2017 he developed slurred speech and was told that he had three strokes which were the cause of his symptoms, the slurred speech resolved and he was not left with any neurologic deficit.  He notes that he has been off of his antihypertensive and lipid lowering medications for months. He was initially seen at [facility] where a CT revealed a new right frontal and parietal lobe density concerning for new ischemic stroke.   LKW: 2 weeks prior  tpa given?: no, outside of window   Attestation signed by Douglas Doom, MD at 08/05/2018 8:57 PM  I have seen the patient and reviewed the resident's note.  I agree with her exam with the following exceptions on my exam, he has a left hemianopia, also has mild 4/5 weakness of the left arm and leg.  He does not extinguish to double simultaneous stimulation but he is not aware of the weakness of his left arm and leg which I suspect represents a motor neglect.  He has multifocal areas of infarction on his MRI including cerebellum bilateral occipital lobes, bilateral frontal lobes.  His right frontal infarcts have an appearance of watershed territory infarctions and I suspect that this is due to his MCA occlusion.  He also has some scattered infarcts in  other territories which do not appear to be watershed and possibly represent cardiac emboli.  Is possible that he embolized to his right MCA occluding it but the deficits are not as severe as would be expected due to chronic stenosis.  I would favor better evaluation with CT angiogram to see if it is completely occluded.      SUBJECTIVE (INTERVAL HISTORY) The pt's wife was at the bedside. The pt's main complaint at this time is headache.He has a history of previous strokes evaluated in [facility]. He  significant history of ongoing alcohol and tobacco use.    OBJECTIVE Vitals:   08/05/18 1702 08/05/18 2000 08/06/18 0000 08/06/18 0448  BP: (!) 195/98 (!) 190/85 (!) 195/74 136/83  Pulse: 60 64 65 75  Resp:  20 18 18   Temp: 97.6 F (36.4 C) 98.4 F (36.9 C) 98.7 F (37.1 C) 98.5 F (36.9 C)  TempSrc: Axillary Axillary Axillary Oral  SpO2: 100% 100% 100% 99%  Weight:      Height:        CBC:  Recent Labs  Lab 08/04/18 2211 08/05/18 1004  WBC 8.6 7.6  HGB 11.8* 11.6*  HCT 36.0* 35.5*  MCV 97.6 97.5  PLT 156 536    Basic Metabolic Panel:  Recent Labs  Lab 08/04/18 2211 08/05/18 1004  NA  --  140  K  --  3.3*  CL  --  106  CO2  --  26  GLUCOSE  --  119*  BUN  --  14  CREATININE 1.32* 1.23  CALCIUM  --  8.8*    Lipid Panel:     Component Value Date/Time   CHOL 175 08/05/2018 1004   TRIG 167 (H) 08/05/2018 1004   HDL 38 (L) 08/05/2018 1004   CHOLHDL 4.6 08/05/2018 1004   VLDL 33 08/05/2018 1004   LDLCALC 104 (H) 08/05/2018 1004   HgbA1c:  Lab Results  Component Value Date   HGBA1C 5.6 08/05/2018   Urine Drug Screen: No results found for: LABOPIA, COCAINSCRNUR, LABBENZ, AMPHETMU, THCU, LABBARB  Alcohol Level No results found for: ETH  IMAGING  Ct Angio Head W Or Wo Contrast Ct Angio Neck W Or Wo Contrast 08/06/2018 IMPRESSION:  1. 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 Little to no collateral flow seen distally within the  right MCA territory by CTA.  2. 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 Moderate approximate 50% atheromatous stenosis at the left V4 segment. Dominant left vertebral artery and remainder the vertebrobasilar circulation otherwise patent.  3. Moderate carotid siphon atherosclerotic change with associated moderate to advanced multifocal stenoses.  4. Short-segment 50% stenosis at the origin of the left CCA.  5. Short-segment 60% stenosis involving the proximal left subclavian artery, distal to the origin of the left vertebral artery.  6. 16 mm soft tissue lesion involving the subcutaneous fat of the anterior right neck, indeterminate, but suspected to reflect a sebaceous cyst. Correlation with physical exam recommended.  7. Emphysema with 5 mm right upper lobe nodule, indeterminate. No follow-up needed if patient is low-risk. Non-contrast chest CT can be considered in 12 months if patient is high-risk.    Mr Douglas Fowler Head Wo Contrast 08/05/2018 IMPRESSION:  1. Large acute/early subacute infarctions involving the right superior frontal lobe and the right parieto-occipital lobe. Several additional scattered subcentimeter foci of recent infarction throughout the cerebral hemispheres and the left cerebellum. No associated acute hemorrhage or mass effect.  2. Right M1 occlusion is new from 2016.  3. Several small chronic infarcts, the largest involving right frontal lobe and right parietal lobe.    Transthoracic Echocardiogram  08/05/2018 Study Conclusions  - Left ventricle: The cavity size was normal. Wall thickness was   normal. Systolic function was normal. The estimated ejection   fraction was in the range of 50% to 55%. - Aortic valve: There was mild to moderate regurgitation. Valve   area (VTI): 2.34 cm^2. Valve area (Vmax): 2.35 cm^2. Valve area   (Vmean): 2.26 cm^2. - Mitral valve: There was mild regurgitation. - Left atrium: The atrium was mildly dilated.    Bilateral  Carotid Dopplers  08/06/2018 Final Interpretation: Right Carotid: Velocities in the right ICA are consistent with a 1-39% stenosis. Left Carotid: Velocities in the left ICA are consistent with a 1-39% stenosis. Vertebrals: Left vertebral artery demonstrates antegrade flow.   Right vertebral artery demonstrates bidirectional flow.     PHYSICAL EXAM Blood pressure 136/83, pulse 75, temperature 98.5 F (36.9 C), temperature source Oral, resp. rate 18, height 5\' 6"  (1.676 m), weight 80.8 kg, SpO2 99 %.  Pleasant middle aged male not in distress. . Afebrile. Head is nontraumatic. Neck is supple without bruit.    Cardiac exam no murmur or gallop. Lungs are clear to auscultation. Distal pulses are well felt.  Neurological Exam :  Awake alert oriented to time place and person. No aphasia or apraxia dysarthria. Follows  commands well. Extraocular movements are full range without nystagmus. His diminished vision acuity in the right eye as well as large left homonymous hemianopsia itch greatly limit his vision and he  can count fingers only at 2 feet. Pupils are equal reactive. Fundi were not visualized. Face is symmetric without weakness. Tongue is midline. Motor system exam symmetric upper and lower extremity strength without drift or focal weakness. Deep tendon reflexes are symmetric plantars are downgoing. Sensation appears intact. Gait not tested.    ASSESSMENT/PLAN Mr. KYEN TAITE is a 52 y.o. male with history of CAD, CVA, HTN, HLD, tobacco presenting with constant and progressivevision loss. He did not receive IV t-PA due to late presentation.  Strokes: Multiple right sided infarcts - embolic - source unknown.  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 Several additional scattered subcentimeter foci of recent  infarction throughout the cerebral hemispheres and the left cerebellum.  MRA head - Right M1 occlusion is new from 2016.   CTA H&N - Proximal right M1 occlusion. 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 60% stenosis involving the proximal left subclavian artery.  Carotid Doppler - Right vertebral artery demonstrates bidirectional flow.  2D Echo  - EF 50 - 55%. No cardiac source of emboli identified.   LDL - 104  HgbA1c - 5.6  VTE prophylaxis - Lovenox  Diet -  - Heart healthy with thin liquids.  No antithrombotic prior to admission, now on aspirin 325 mg daily  Patient counseled to be compliant with his antithrombotic medications  Ongoing aggressive stroke risk factor management  Therapy recommendations:  pending  Disposition:  Pending  Hypertension  Stable - somewhat high  Permissive hypertension (OK if < 220/120) but gradually normalize in 5-7 days . Long-term BP goal normotensive  Hyperlipidemia  Lipid lowering medication PTA: none  LDL 104, goal < 70  Current lipid lowering medication: Lipitor 80 mg daily  Continue statin at discharge  Other Stroke Risk Factors  Cigarette smoke - advised to stop smoking  ETOH use, advised to drink no more than 1 alcoholic beverage per day.  Obesity, Body mass index is 28.75 kg/m., recommend weight loss, diet and exercise as appropriate   Hx stroke/TIA  Coronary artery disease  Other Active Problems  Etoh and tobacco  Poor medical compliance  5 mm right upper lobe nodule, indeterminate (long hx of tobacco use)  Mild anemia  Mild hypokalemia 3.3  PLAN  Would not pursue further workup (TEE / Loop) for embolic source as pt is not a good candidate for anticoagulation due to ETOH hx and poor medical compliance.   Hospital day # 2  Douglas Bussing PA-C [facility] Pager [phone] 08/06/2018, 3:12 PM I have personally examined this patient, reviewed notes, independently  viewed imaging studies, participated in medical decision making and plan of care.ROS completed by me personally and pertinent positives fully documented  I have made any additions or clarifications directly to the above note. Agree with note above.He has presented with subacute vision difficulties due to bi-cerebral infarcts old as well as new. He has demonstrated noncompliance with medical follow-ups as well as his medications hence is not a good candidate for long-term anticoagulation and hence we'll not pursue aggressive cardiac evaluation. Check ongoing stroke workup. Recommend antiplatelet therapy alone   and low-dose statin. Long-term discussion with  the patient and Dr. Evangeline Gula and answered questions about his care. Greater than 50% time during his 35 minute visit was spent on counseling and coordination of care about his embolic strokes and discussion about evaluation and treatment and answered questions  Antony Contras, MD Medical Director [place] Pager: [phone] 08/06/2018 3:41 PM   To contact Stroke Continuity provider, please refer to [email or web address] After hours, contact General Neurology

## 2018-08-07 MED ORDER — AMLODIPINE BESYLATE 10 MG PO TABS
10.0000 mg | ORAL_TABLET | Freq: Every day | ORAL | Status: DC
  Administered 2018-08-07: 10 mg via ORAL
  Filled 2018-08-07: qty 1

## 2018-08-07 MED ORDER — ASPIRIN 325 MG PO TABS: 325.0000 mg | ORAL_TABLET | Freq: Every day | ORAL | 0 refills | Status: DC

## 2018-08-07 MED ORDER — AMLODIPINE BESYLATE 10 MG PO TABS: 10.0000 mg | ORAL_TABLET | Freq: Every day | ORAL | 0 refills | Status: DC

## 2018-08-07 MED ORDER — AMLODIPINE BESYLATE 10 MG PO TABS: 10.0000 mg | ORAL_TABLET | Freq: Every day | ORAL | Status: DC

## 2018-08-07 MED ORDER — NICOTINE 14 MG/24HR TD PT24: 14.0000 mg | MEDICATED_PATCH | Freq: Every day | TRANSDERMAL | 0 refills | Status: DC

## 2018-08-07 MED ORDER — CARVEDILOL 3.125 MG PO TABS: 3.1250 mg | ORAL_TABLET | Freq: Two times a day (BID) | ORAL | 0 refills | Status: DC

## 2018-08-07 MED ORDER — ATORVASTATIN CALCIUM 80 MG PO TABS: 80.0000 mg | ORAL_TABLET | Freq: Every day | ORAL | 0 refills | Status: DC

## 2018-08-07 MED ORDER — CARVEDILOL 3.125 MG PO TABS
3.1250 mg | ORAL_TABLET | Freq: Two times a day (BID) | ORAL | Status: DC
  Administered 2018-08-07: 3.125 mg via ORAL
  Filled 2018-08-07: qty 1

## 2018-08-07 NOTE — Progress Notes (Signed)
Occupational Therapy Treatment Patient Details Name: Douglas Fowler MRN: [id number] DOB: [birth year] Today's Date: 08/07/2018    History of present illness Douglas Fowler is a 52 y.o. male PMH including CAD, HTN, dyslipidemia and prior strokes who presents with episodic visual loss associated with bilateral lower extremity weakness with ambulatory dysfunction and disbalance. CT of the head showed new right frontal and parietal lobe densities concerning for new ischemic stroke.    OT comments  Pt presents supine in bed agreeable to OT tx session. Pt continues to demonstrate significant difficulty navigating environment due to visual deficits, requiring minA for functional mobility and mod-max multimodal cues throughout. Pt does demonstrate improvements with navigating during mobility in more familiar environment (pt room), though continues to require close guard and min cues for safety. Continued education to both pt/pt's spouse regarding visual compensatory strategies for navigating environment to increase safety with mobility and functional task completion. Noted pt choosing to return home vs CIR, discharge recommendations have been updated to reflect. Recommend HHOT services at time of discharge to maximize pt's overall safety and independence with ADLs and mobility given current deficits. Pt anticipating d/c home later today.    Follow Up Recommendations  Home health OT;Supervision/Assistance - 24 hour    Equipment Recommendations  Tub/shower seat          Precautions / Restrictions Precautions Precautions: Fall Precaution Comments: left inattention? visual deficits Restrictions Weight Bearing Restrictions: No       Mobility Bed Mobility Overal bed mobility: Needs Assistance Bed Mobility: Supine to Sit;Sit to Supine     Supine to sit: Supervision Sit to supine: Supervision   General bed mobility comments: for safety  Transfers Overall transfer level: Needs  assistance Equipment used: None Transfers: Sit to/from Stand Sit to Stand: Supervision         General transfer comment: Supervision for safety.     Balance Overall balance assessment: Needs assistance Sitting-balance support: Feet supported;No upper extremity supported Sitting balance-Leahy Scale: Normal     Standing balance support: During functional activity Standing balance-Leahy Scale: Fair Standing balance comment: ABle to brush teeth, wash face finding all necessary items on left side.                           ADL either performed or assessed with clinical judgement   ADL Overall ADL's : Needs assistance/impaired     Grooming: Wash/dry hands;Oral care;Min guard;Minimal assistance;Standing;Cueing for safety;Cueing for compensatory techniques Grooming Details (indicate cue type and reason): mod A to find items on the left side of sink                 Toilet Transfer: Minimal assistance;Ambulation;Regular Toilet;Grab bars Toilet Transfer Details (indicate cue type and reason): minA for balance, mod cues for environment navigation Toileting- Water quality scientist and Hygiene: Min guard;Sit to/from stand       Functional mobility during ADLs: Minimal assistance;Cueing for safety General ADL Comments: pt continues to have significant visual difficulties; requires consistent mod-max cues for navigating area; further focus of session on educating both pt/pt's spouse on visual compensatory strategies including "Lighthouse" method and finding items in environment to use as anchors for navigating; educated spouse on services for the blind handout issued to pt during previous session                        Cognition Arousal/Alertness: Awake/alert Behavior During Therapy: [facility] for tasks assessed/performed Overall  Cognitive Status: Impaired/Different from baseline Area of Impairment: Following commands;Safety/judgement;Problem solving                        Following Commands: Follows one step commands with increased time Safety/Judgement: Decreased awareness of deficits   Problem Solving: Slow processing;Decreased initiation;Difficulty sequencing;Requires verbal cues;Requires tactile cues General Comments: pt requires Increased multi modal cues for safety and attention to task performance. Left inattention, visual deficits. requires cues for implementing visual compensatory strategies         Exercises     Shoulder Instructions       General Comments      Pertinent Vitals/ Pain       Pain Assessment: No/denies pain Faces Pain Scale: Hurts a little bit Pain Location: "eye sockets" Pain Descriptors / Indicators: Sore;Other (Comment)(straining)  Home Living                                          Prior Functioning/Environment              Frequency  Min 3X/week        Progress Toward Goals  OT Goals(current goals can now be found in the care plan section)  Progress towards OT goals: Progressing toward goals  Acute Rehab OT Goals Patient Stated Goal: to be able to see OT Goal Formulation: With patient Time For Goal Achievement: 08/19/18 Potential to Achieve Goals: Good  Plan Discharge plan needs to be updated    Co-evaluation                 AM-PAC PT "6 Clicks" Daily Activity     Outcome Measure   Help from another person eating meals?: A Lot Help from another person taking care of personal grooming?: A Lot Help from another person toileting, which includes using toliet, bedpan, or urinal?: A Little Help from another person bathing (including washing, rinsing, drying)?: A Little Help from another person to put on and taking off regular upper body clothing?: A Little Help from another person to put on and taking off regular lower body clothing?: A Little 6 Click Score: 16    End of Session Equipment Utilized During Treatment: Gait belt  OT Visit Diagnosis:  Unsteadiness on feet (R26.81);Other abnormalities of gait and mobility (R26.89);Repeated falls (R29.6);Other symptoms and signs involving cognitive function;Low vision, both eyes (H54.2)   Activity Tolerance Patient tolerated treatment well   Patient Left in bed;with call bell/phone within reach;with family/visitor present   Nurse Communication Mobility status        Time: 3875-6433 OT Time Calculation (min): 18 min  Charges: OT General Charges $OT Visit: 1 Visit OT Treatments $Self Care/Home Management : 8-22 mins  Lou Cal, [place] Pager [phone] Office [phone]     Raymondo Band 08/07/2018, 3:02 PM

## 2018-08-07 NOTE — Care Management Note (Signed)
Case Management Note  Patient Details  Name: SUDEEP SCHEIBEL MRN: [id number] Date of Birth: August 13, 1966  Subjective/Objective:     Pt admitted with CVA. He is from home with his spouse. Pt denies transportation and medication issues.                Action/Plan: Pt discharging home with orders for [facility] services. CM provided choice and they selected [facility]. Tammy with [facility] notified and accepted the referral. Pt has 24 hour supervision at home and transportation to home.   Expected Discharge Date:                  Expected Discharge Plan:  [place]  In-House Referral:     Discharge planning Services  CM Consult  Post Acute Care Choice:  Home Health Choice offered to:  Patient, Spouse  DME Arranged:    DME Agency:     HH Arranged:  PT, OT HH Agency:  [facility]  Status of Service:  Completed, signed off  If discussed at [place] of Stay Meetings, dates discussed:    Additional Comments:  Pollie Friar, RN 08/07/2018, 12:19 PM

## 2018-08-07 NOTE — Progress Notes (Addendum)
Physical Therapy Treatment Patient Details Name: Douglas Fowler MRN: [id number] DOB: [birth year] Today's Date: 08/07/2018    History of Present Illness Douglas Fowler is a 52 y.o. male PMH including CAD, HTN, dyslipidemia and prior strokes who presents with episodic visual loss associated with bilateral lower extremity weakness with ambulatory dysfunction and disbalance. CT of the head showed new right frontal and parietal lobe densities concerning for new ischemic stroke.     PT Comments    Patient with slight improvement with navigating in hallway today. Knows to look for signs to try to find way around however still demonstrates severe visual deficits, inattention and difficulty scanning environment. Requires max cues to navigate hallways and find locations with pt running into walls/objects consistently. Concerned about pt's safety at home. Pt with poor awareness of the severity of deficits. Pt refusing CIR so d/c recommendation updated to HHPT. Will need 24/7 supervision at home. Would still benefit from intensive therapies to improve visual compensatory strategies and safety. If pt changes mind, first recommendation is CIR. Will follow.   Follow Up Recommendations  Home health PT;Supervision/Assistance - 24 hour     Equipment Recommendations  None recommended by PT    Recommendations for Other Services       Precautions / Restrictions Precautions Precautions: Fall Precaution Comments: left inattention? visual deficits Restrictions Weight Bearing Restrictions: No    Mobility  Bed Mobility Overal bed mobility: Needs Assistance Bed Mobility: Supine to Sit;Sit to Supine     Supine to sit: Modified independent (Device/Increase time);HOB elevated Sit to supine: Modified independent (Device/Increase time);HOB elevated   General bed mobility comments: Pt ran into bed rail upon returning to room.  Transfers Overall transfer level: Needs assistance Equipment used:  None Transfers: Sit to/from Stand Sit to Stand: Supervision         General transfer comment: Supervision for safety.   Ambulation/Gait Ambulation/Gait assistance: Min assist Gait Distance (Feet): 300 Feet Assistive device: None Gait Pattern/deviations: Step-through pattern;Decreased stride length;Staggering right;Staggering left Gait velocity: decreased   General Gait Details: Unable to safely ambulate without assist due to visual/attentional deficits. Difficulty following multi step directional cues. left inattention noted. Trouble reading signs in hallway. Cannot find way to certain places in hallway.    Stairs             Wheelchair Mobility    Modified Rankin (Stroke Patients Only) Modified Rankin (Stroke Patients Only) Pre-Morbid Rankin Score: No symptoms Modified Rankin: Moderately severe disability     Balance Overall balance assessment: Needs assistance Sitting-balance support: Feet supported;No upper extremity supported Sitting balance-Leahy Scale: Normal     Standing balance support: During functional activity Standing balance-Leahy Scale: Fair Standing balance comment: ABle to brush teeth, wash face finding all necessary items on left side.                            Cognition Arousal/Alertness: Awake/alert Behavior During Therapy: WFL for tasks assessed/performed Overall Cognitive Status: Impaired/Different from baseline                         Following Commands: Follows one step commands with increased time;Follows one step commands inconsistently Safety/Judgement: Decreased awareness of deficits;Decreased awareness of safety   Problem Solving: Slow processing;Requires verbal cues;Requires tactile cues General Comments: Requires constant verbal/tactile cues to implement visual compensatory strategies. Left inattention.      Exercises      General Comments  Pertinent Vitals/Pain Pain Assessment: No/denies  pain    Home Living                      Prior Function            PT Goals (current goals can now be found in the care plan section) Progress towards PT goals: Progressing toward goals    Frequency    Min 3X/week      PT Plan Discharge plan needs to be updated    Co-evaluation              AM-PAC PT "6 Clicks" Daily Activity  Outcome Measure  Difficulty turning over in bed (including adjusting bedclothes, sheets and blankets)?: None Difficulty moving from lying on back to sitting on the side of the bed? : None Difficulty sitting down on and standing up from a chair with arms (e.g., wheelchair, bedside commode, etc,.)?: A Little Help needed moving to and from a bed to chair (including a wheelchair)?: A Little Help needed walking in hospital room?: A Lot Help needed climbing 3-5 steps with a railing? : A Lot 6 Click Score: 18    End of Session Equipment Utilized During Treatment: Gait belt Activity Tolerance: Patient tolerated treatment well Patient left: in bed;with call bell/phone within reach;with family/visitor present Nurse Communication: Mobility status PT Visit Diagnosis: Difficulty in walking, not elsewhere classified (R26.2);Other symptoms and signs involving the nervous system (R29.898)     Time: 1165-7903 PT Time Calculation (min) (ACUTE ONLY): 45 min  Charges:  $Therapeutic Activity: 8-22 mins $Neuromuscular Re-education: 23-37 mins                     Wray Kearns, [place], DPT Acute Rehabilitation Services Pager [phone] Office [place] 08/07/2018, 11:31 AM

## 2018-08-07 NOTE — Progress Notes (Signed)
STROKE TEAM PROGRESS NOTE      SUBJECTIVE (INTERVAL HISTORY) The pt's wife was at the bedside. The pt has been seen by therapy and home health therapy has been recommended with 24/7 supervision due to fall/safety concerns. Carotid ultrasound showed no significant stenosis. Patient wants to go home   OBJECTIVE Vitals:   08/06/18 2357 08/07/18 0400 08/07/18 0809 08/07/18 1247  BP: (!) 197/100 (!) 194/90 (!) 194/108 (!) 184/85  Pulse: 66 (!) 59 72 67  Resp: 18 20 20 20   Temp: (!) 97.3 F (36.3 C) 98.1 F (36.7 C) 97.8 F (36.6 C) 97.7 F (36.5 C)  TempSrc: Oral Oral Oral Oral  SpO2: 97% 99% 100% 100%  Weight:      Height:        CBC:  Recent Labs  Lab 08/04/18 2211 08/05/18 1004  WBC 8.6 7.6  HGB 11.8* 11.6*  HCT 36.0* 35.5*  MCV 97.6 97.5  PLT 156 301    Basic Metabolic Panel:  Recent Labs  Lab 08/04/18 2211 08/05/18 1004  NA  --  140  K  --  3.3*  CL  --  106  CO2  --  26  GLUCOSE  --  119*  BUN  --  14  CREATININE 1.32* 1.23  CALCIUM  --  8.8*    Lipid Panel:     Component Value Date/Time   CHOL 175 08/05/2018 1004   TRIG 167 (H) 08/05/2018 1004   HDL 38 (L) 08/05/2018 1004   CHOLHDL 4.6 08/05/2018 1004   VLDL 33 08/05/2018 1004   LDLCALC 104 (H) 08/05/2018 1004   HgbA1c:  Lab Results  Component Value Date   HGBA1C 5.6 08/05/2018   Urine Drug Screen: No results found for: LABOPIA, COCAINSCRNUR, LABBENZ, AMPHETMU, THCU, LABBARB  Alcohol Level No results found for: ETH  IMAGING  Ct Angio Head W Or Wo Contrast Ct Angio Neck W Or Wo Contrast 08/06/2018 IMPRESSION:  1. 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 Little to no collateral flow seen distally within the right MCA territory by CTA.  2. 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 Moderate approximate 50% atheromatous stenosis at the left V4 segment. Dominant left vertebral artery and remainder the vertebrobasilar circulation otherwise patent.  3.  Moderate carotid siphon atherosclerotic change with associated moderate to advanced multifocal stenoses.  4. Short-segment 50% stenosis at the origin of the left CCA.  5. Short-segment 60% stenosis involving the proximal left subclavian artery, distal to the origin of the left vertebral artery.  6. 16 mm soft tissue lesion involving the subcutaneous fat of the anterior right neck, indeterminate, but suspected to reflect a sebaceous cyst. Correlation with physical exam recommended.  7. Emphysema with 5 mm right upper lobe nodule, indeterminate. No follow-up needed if patient is low-risk. Non-contrast chest CT can be considered in 12 months if patient is high-risk.    [name] Head Wo Contrast 08/05/2018 IMPRESSION:  1. Large acute/early subacute infarctions involving the right superior frontal lobe and the right parieto-occipital lobe. Several additional scattered subcentimeter foci of recent infarction throughout the cerebral hemispheres and the left cerebellum. No associated acute hemorrhage or mass effect.  2. Right M1 occlusion is new from 2016.  3. Several small chronic infarcts, the largest involving right frontal lobe and right parietal lobe.    Transthoracic Echocardiogram  08/05/2018 Study Conclusions  - Left ventricle: The cavity size was normal. Wall thickness was   normal. Systolic function was normal.  The estimated ejection   fraction was in the range of 50% to 55%. - Aortic valve: There was mild to moderate regurgitation. Valve   area (VTI): 2.34 cm^2. Valve area (Vmax): 2.35 cm^2. Valve area   (Vmean): 2.26 cm^2. - Mitral valve: There was mild regurgitation. - Left atrium: The atrium was mildly dilated.    Bilateral Carotid Dopplers  08/06/2018 Final Interpretation: Right Carotid: Velocities in the right ICA are consistent with a 1-39% stenosis. Left Carotid: Velocities in the left ICA are consistent with a 1-39% stenosis. Vertebrals: Left vertebral artery demonstrates  antegrade flow.   Right vertebral artery demonstrates bidirectional flow.     PHYSICAL EXAM Blood pressure (!) 184/85, pulse 67, temperature 97.7 F (36.5 C), temperature source Oral, resp. rate 20, height 5\' 6"  (1.676 m), weight 80.8 kg, SpO2 100 %.  Pleasant middle aged male not in distress. . Afebrile. Head is nontraumatic. Neck is supple without bruit.    Cardiac exam no murmur or gallop. Lungs are clear to auscultation. Distal pulses are well felt.  Neurological Exam :  Awake alert oriented to time place and person. No aphasia or apraxia dysarthria. Follows commands well. Extraocular movements are full range without nystagmus. His diminished vision acuity in the right eye as well as large left homonymous hemianopsia  which greatly limit his vision and he  can count fingers only at 2 feet. Pupils are equal reactive. Fundi were not visualized. Face is symmetric without weakness. Tongue is midline. Motor system exam symmetric upper and lower extremity strength without drift or focal weakness. Deep tendon reflexes are symmetric plantars are downgoing. Sensation appears intact. Gait not tested.    ASSESSMENT/PLAN Douglas Fowler is a 52 y.o. male with history of CAD, CVA, HTN, HLD, tobacco presenting with constant and progressivevision loss. He did not receive IV t-PA due to late presentation.  Strokes: Multiple right sided infarcts - embolic - source unknown.  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 Several additional scattered subcentimeter foci of recent infarction throughout the cerebral hemispheres and the left cerebellum.  MRA head - Right M1 occlusion is new from 2016.   CTA H&N - Proximal right M1 occlusion. 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 60% stenosis involving the  proximal left subclavian artery.  Carotid Doppler - Right vertebral artery demonstrates bidirectional flow.  2D Echo  - EF 50 - 55%. No cardiac source of emboli identified.   LDL - 104  HgbA1c - 5.6  VTE prophylaxis - Lovenox  Diet -  - Heart healthy with thin liquids.  No antithrombotic prior to admission, now on aspirin 325 mg daily  Patient counseled to be compliant with his antithrombotic medications  Ongoing aggressive stroke risk factor management  Therapy recommendations:  HHPtTOT  Disposition:  home  Hypertension  Stable - somewhat high  Permissive hypertension (OK if < 220/120) but gradually normalize in 5-7 days . Long-term BP goal normotensive  Hyperlipidemia  Lipid lowering medication PTA: none  LDL 104, goal < 70  Current lipid lowering medication: Lipitor 80 mg daily  Continue statin at discharge  Other Stroke Risk Factors  Cigarette smoke - advised to stop smoking  ETOH use, advised to drink no more than 1 alcoholic beverage per day.  Obesity, Body mass index is 28.75 kg/m., recommend weight loss, diet and exercise  as appropriate   Hx stroke/TIA  Coronary artery disease  Other Active Problems  Etoh and tobacco  Poor medical compliance  5 mm right upper lobe nodule, indeterminate (long hx of tobacco use)  Mild anemia  Mild hypokalemia 3.3  PLAN  Would not pursue further workup (TEE / Loop) for embolic source as pt is not a good candidate for anticoagulation due to ETOH hx and poor medical compliance and fall/safety concerns from poor vision.   Hospital day # 3   He has presented with subacute vision difficulties due to bi-cerebral infarcts old as well as new. He has demonstrated noncompliance with medical follow-ups as well as his medications  And has fall/safty concerns hence is not a good candidate for long-term anticoagulation and hence we'll not pursue aggressive cardiac evaluation.  Recommend antiplatelet therapy alone   and  low-dose statin. Long-term discussion with the patient, his wife  and Dr.Joseph and answered questions about his care. Greater than 50% time during his 25 minute visit was spent on counseling and coordination of care about his embolic strokes and discussion about evaluation and treatment and answered questions Stroke team will sign off. Kindly call for questions. Follow-up as an outpatient in stroke clinic in 6 weeks. Antony Contras, MD Medical Director [facility] Pager: [phone] 08/07/2018 2:24 PM   To contact Stroke Continuity provider, please refer to [email or web address] After hours, contact General Neurology

## 2018-08-07 NOTE — Progress Notes (Signed)
Assumed care for patient at 1000

## 2018-08-07 NOTE — Care Management Important Message (Signed)
Important Message  Patient Details  Name: Douglas Fowler MRN: [id number] Date of Birth: [birth year]   Medicare Important Message Given:  Yes    Harl Wiechmann [address] 08/07/2018, 3:34 PM

## 2018-08-07 NOTE — Progress Notes (Signed)
Patient and family given discharge summary and teaching. No new questions or concerns. IV and telemetry removed. Patient discharged from unit in wheelchair by staff. Family to transport home

## 2018-08-19 NOTE — Discharge Summary (Signed)
Physician Discharge Summary  Douglas Fowler CWU:[id number] DOB: 1965-12-06 DOA: 08/04/2018  PCP: Imagene Riches, NP  Admit date: 08/04/2018 Discharge date: 08/07/2018  Time spent: 45 minutes  Recommendations for Outpatient Follow-up:  1. PCP in 1 week 2. Neurology Dr.Sethi in 1 month 3. Right lung nodule needs Follow up   Discharge Diagnoses:  Principal Problem:   CVA (cerebral vascular accident) ([place]) Active Problems:   Essential hypertension   CAD (coronary artery disease)   Tobacco abuse   Hypokalemia   AKI (acute kidney injury) ([place])   Acute blood loss anemia   Discharge Condition: stable  Diet recommendation: heart healthy and  Filed Weights   08/04/18 2043  Weight: 80.8 kg    History of present illness:  52 year old male with history of CAD, hypertension, dyslipidemia, tobacco abuse, prior strokes, who stopped taking all his medications presented to the ER at [facility] yesterday with ongoing vision loss, double vision for 1-2 weeks, recommended to go to the ER by his PCP   Hospital Course:   CVA (cerebral vascular accident) ([place]) -CT brain at outside hospital, concerning for right frontal and parietal lobe infarcts -MRI brain noted Large acute/early subacute infarctions involving the right superior frontal lobe and the right parieto-occipital lobe, and diffuse scattered subcentimeter infarcts.  -pt noted to have diplopia and left visual field deficits -neurology consulted, underwent CTA head and neck  Works inwhich showed diffuse intra- Cranial atherosclerosis -Continue aspirin and atorvastatin -2-D echocardiogram with EF of 50-55% -LDL is 104,  hba1cA1c is 5.6 -Patient extremely noncompliant, stopped all his medications 7-8 months ago and continues to smoke, hence neurology did not pursue aggressive cardiac evaluation -PT OT, SLP evaluations completed, CIR recommended however patient declined and would rather discharge home, set up home health  services  Hypertension -Systolic blood pressure in the 160-170 range -started low-dose beta blockers at discharge  History of CAD -Wife reports 3 stents approximately 5 years ago -Does not take any medications for CAD at this time -continue aspirin, added beta blocker at discharge, started statin  R lung nodule -needs FU  Tobacco abuse -counseled, nicotine patch  noncompliance Discharge Exam: Vitals:   08/07/18 0809 08/07/18 1247  BP: (!) 194/108 (!) 184/85  Pulse: 72 67  Resp: 20 20  Temp: 97.8 F (36.6 C) 97.7 F (36.5 C)  SpO2: 100% 100%    General: AAOx3 Cardiovascular: S1S2/RRR Respiratory: CTAB  Discharge Instructions   Discharge Instructions    Diet - low sodium heart healthy   Complete by:  As directed    Increase activity slowly   Complete by:  As directed      Allergies as of 08/07/2018   No Known Allergies     Medication List    TAKE these medications   amLODipine 10 MG tablet Commonly known as:  NORVASC Take 1 tablet (10 mg total) by mouth daily.   aspirin 325 MG tablet Take 1 tablet (325 mg total) by mouth daily.   atorvastatin 80 MG tablet Commonly known as:  LIPITOR Take 1 tablet (80 mg total) by mouth daily at 6 PM.   carvedilol 3.125 MG tablet Commonly known as:  COREG Take 1 tablet (3.125 mg total) by mouth 2 (two) times daily with a meal.   nicotine 14 mg/24hr patch Commonly known as:  NICODERM CQ - dosed in mg/24 hours Place 1 patch (14 mg total) onto the skin daily.      No Known Allergies Follow-up Information    [facility],  [facility].   Specialty:  Home Health Services Why:  They will contact you for the first visit. Contact information: [place] [address] [phone]        Imagene Riches, NP. Schedule an appointment as soon as possible for a visit in 1 week(s).   Contact information: [place] [address] [phone]        Garvin Fila, MD. Schedule  an appointment as soon as possible for a visit in 1 month(s).   Specialties:  Neurology, Radiology Contact information: [address] [place] [address] [phone]            The results of significant diagnostics from this hospitalization (including imaging, microbiology, ancillary and laboratory) are listed below for reference.    Significant Diagnostic Studies: Ct Angio Head W Or Wo Contrast  Result Date: 08/06/2018 CLINICAL DATA:  Follow-up examination for acute stroke. EXAM: CT ANGIOGRAPHY HEAD AND NECK TECHNIQUE: Multidetector CT imaging of the head and neck was performed using the standard protocol during bolus administration of intravenous contrast. Multiplanar CT image reconstructions and MIPs were obtained to evaluate the vascular anatomy. Carotid stenosis measurements (when applicable) are obtained utilizing NASCET criteria, using the distal internal carotid diameter as the denominator. CONTRAST:  29mL ISOVUE-370 IOPAMIDOL (ISOVUE-370) INJECTION 76% COMPARISON:  Prior MRI from 08/05/2018 and CT from 08/04/2018. FINDINGS: CTA NECK FINDINGS Aortic arch: Visualized aortic arch of normal caliber with normal 3 vessel morphology. Mild to moderate atheromatous plaque within the aortic arch and about the origin of the great vessels. Associated approximate 50% stenosis at the origin of the left common carotid artery. No other hemodynamically significant stenosis about the origin of the great vessels. There is a short-segment stenosis of approximately 60% at the proximal left subclavian artery beyond the takeoff of the left vertebral artery (series 7, image 290). Visualized subclavian arteries otherwise widely patent. Right carotid system: Scattered mixed a centric plaque within the right common carotid artery without hemodynamically significant stenosis. Mixed multifocal plaque about the right carotid bifurcation and proximal right ICA with relatively mild approximate 20-30%  stenosis. Right ICA patent distally to the skull base without stenosis, dissection, or occlusion. Left carotid system: Approximate 50% stenosis at the origin of the left common carotid artery. Left CCA otherwise patent to the bifurcation without stenosis. Mixed multifocal plaque about the left bifurcation/proximal left ICA with associated stenosis of up to approximate 40% by NASCET criteria. Left ICA otherwise patent distally to the skull base without stenosis, dissection, or occlusion. Vertebral arteries: Both of the vertebral arteries arise from the subclavian arteries. Left vertebral artery is dominant focal non stenotic plaque noted within the pre foraminal left V1 segment. Left vertebral artery otherwise widely patent within the neck without stenosis, dissection, or occlusion. Right vertebral artery markedly diminutive and hypoplastic in appearance, and is grossly patent within the neck, but essentially occludes as it courses into the cranial vault. Skeleton: No acute osseous abnormality. No discrete lytic or blastic osseous lesions. Reversal of the normal cervical lordosis with moderate cervical spondylolysis at C4-5 through C6-7. Poor dentition noted. Other neck: Soft tissues of the neck demonstrate no acute finding. Salivary glands within normal limits. Thyroid normal. No adenopathy. 16 mm well-circumscribed soft tissue lesion within the subcutaneous fat at the right submandibular region noted (series 5, image 120), likely a sebaceous cyst. Upper chest: Visualized upper chest demonstrates no significant finding. Mild emphysema noted within the visualized lungs. 5 mm  right upper lobe nodule (series 5, image 178), indeterminate. Subcentimeter calcified right upper lobe calcified granuloma noted. Review of the MIP images confirms the above findings CTA HEAD FINDINGS Anterior circulation: Petrous segments patent bilaterally without stenosis. Atheromatous plaque throughout the cavernous/supraclinoid ICAs with  moderate to advanced multifocal narrowing, slightly worse on the left. Right ICA is patent to the terminus, with subsequent complete occlusion at the origin of the right MCA, stable from previous MRA. Right M1 segment and its distal branches appear completely occluded by CT a, with scant if any collateral flow seen distally within the right MCA distribution. Left M1 mildly irregular but patent without high-grade stenosis. Normal left MCA bifurcation. No proximal left M2 occlusion. Distal left MCA branches left MCA branches perfused to their distal aspects with diffuse small vessel atheromatous irregularity. A1 segments irregular but patent. Normal anterior communicating artery. A2 segments irregular but patent to their distal aspects without high-grade stenosis. Posterior circulation: Multifocal plaque with associated moderate approximate 50% stenosis within the dominant left V4 segment. Hypoplastic right vertebral artery essentially occluded at the skull base. Posterior inferior cerebral arteries not seen on this examination. Basilar patent to its distal aspect without stenosis. Superior cerebral arteries patent proximally. Both of the PCA supplied via the basilar. Moderate atheromatous irregularity throughout the PCAs bilaterally without high-grade stenosis. PCAs are patent to their distal aspects. Venous sinuses: Not well assessed due to timing of the contrast bolus, but grossly patent. Anatomic variants: Hypoplastic right vertebral artery. Delayed phase: Scattered multifocal areas of serpiginous cortical enhancement seen at the areas of evolving infarction involving the right frontal and parietal lobes as well as the left occipital lobe, consistent with subacute infarcts. No other pathologic enhancement. Review of the MIP images confirms the above findings IMPRESSION: 1. 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 Little to no collateral flow seen distally within the right MCA territory by  CTA. 2. 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 Moderate approximate 50% atheromatous stenosis at the left V4 segment. Dominant left vertebral artery and remainder the vertebrobasilar circulation otherwise patent. 3. Moderate carotid siphon atherosclerotic change with associated moderate to advanced multifocal stenoses. 4. Short-segment 50% stenosis at the origin of the left CCA. 5. Short-segment 60% stenosis involving the proximal left subclavian artery, distal to the origin of the left vertebral artery. 6. 16 mm soft tissue lesion involving the subcutaneous fat of the anterior right neck, indeterminate, but suspected to reflect a sebaceous cyst. Correlation with physical exam recommended. 7. Emphysema with 5 mm right upper lobe nodule, indeterminate. No follow-up needed if patient is low-risk. Non-contrast chest CT can be considered in 12 months if patient is high-risk. This recommendation follows the consensus statement: Guidelines for Management of Incidental Pulmonary Nodules Detected on CT Images: From the Fleischner Society 2017; Radiology 2017; 284:228-243. Electronically Signed   By: Jeannine Boga M.D.   On: 08/06/2018 02:17   Ct Angio Neck W Or Wo Contrast  Result Date: 08/06/2018 CLINICAL DATA:  Follow-up examination for acute stroke. EXAM: CT ANGIOGRAPHY HEAD AND NECK TECHNIQUE: Multidetector CT imaging of the head and neck was performed using the standard protocol during bolus administration of intravenous contrast. Multiplanar CT image reconstructions and MIPs were obtained to evaluate the vascular anatomy. Carotid stenosis measurements (when applicable) are obtained utilizing NASCET criteria, using the distal internal carotid diameter as the denominator. CONTRAST:  30mL ISOVUE-370 IOPAMIDOL (ISOVUE-370) INJECTION 76% COMPARISON:  Prior MRI from 08/05/2018 and CT from 08/04/2018. FINDINGS: CTA NECK FINDINGS  Aortic arch: Visualized aortic arch of normal caliber with  normal 3 vessel morphology. Mild to moderate atheromatous plaque within the aortic arch and about the origin of the great vessels. Associated approximate 50% stenosis at the origin of the left common carotid artery. No other hemodynamically significant stenosis about the origin of the great vessels. There is a short-segment stenosis of approximately 60% at the proximal left subclavian artery beyond the takeoff of the left vertebral artery (series 7, image 290). Visualized subclavian arteries otherwise widely patent. Right carotid system: Scattered mixed a centric plaque within the right common carotid artery without hemodynamically significant stenosis. Mixed multifocal plaque about the right carotid bifurcation and proximal right ICA with relatively mild approximate 20-30% stenosis. Right ICA patent distally to the skull base without stenosis, dissection, or occlusion. Left carotid system: Approximate 50% stenosis at the origin of the left common carotid artery. Left CCA otherwise patent to the bifurcation without stenosis. Mixed multifocal plaque about the left bifurcation/proximal left ICA with associated stenosis of up to approximate 40% by NASCET criteria. Left ICA otherwise patent distally to the skull base without stenosis, dissection, or occlusion. Vertebral arteries: Both of the vertebral arteries arise from the subclavian arteries. Left vertebral artery is dominant focal non stenotic plaque noted within the pre foraminal left V1 segment. Left vertebral artery otherwise widely patent within the neck without stenosis, dissection, or occlusion. Right vertebral artery markedly diminutive and hypoplastic in appearance, and is grossly patent within the neck, but essentially occludes as it courses into the cranial vault. Skeleton: No acute osseous abnormality. No discrete lytic or blastic osseous lesions. Reversal of the normal cervical lordosis with moderate cervical spondylolysis at C4-5 through C6-7. Poor  dentition noted. Other neck: Soft tissues of the neck demonstrate no acute finding. Salivary glands within normal limits. Thyroid normal. No adenopathy. 16 mm well-circumscribed soft tissue lesion within the subcutaneous fat at the right submandibular region noted (series 5, image 120), likely a sebaceous cyst. Upper chest: Visualized upper chest demonstrates no significant finding. Mild emphysema noted within the visualized lungs. 5 mm right upper lobe nodule (series 5, image 178), indeterminate. Subcentimeter calcified right upper lobe calcified granuloma noted. Review of the MIP images confirms the above findings CTA HEAD FINDINGS Anterior circulation: Petrous segments patent bilaterally without stenosis. Atheromatous plaque throughout the cavernous/supraclinoid ICAs with moderate to advanced multifocal narrowing, slightly worse on the left. Right ICA is patent to the terminus, with subsequent complete occlusion at the origin of the right MCA, stable from previous MRA. Right M1 segment and its distal branches appear completely occluded by CT a, with scant if any collateral flow seen distally within the right MCA distribution. Left M1 mildly irregular but patent without high-grade stenosis. Normal left MCA bifurcation. No proximal left M2 occlusion. Distal left MCA branches left MCA branches perfused to their distal aspects with diffuse small vessel atheromatous irregularity. A1 segments irregular but patent. Normal anterior communicating artery. A2 segments irregular but patent to their distal aspects without high-grade stenosis. Posterior circulation: Multifocal plaque with associated moderate approximate 50% stenosis within the dominant left V4 segment. Hypoplastic right vertebral artery essentially occluded at the skull base. Posterior inferior cerebral arteries not seen on this examination. Basilar patent to its distal aspect without stenosis. Superior cerebral arteries patent proximally. Both of the PCA  supplied via the basilar. Moderate atheromatous irregularity throughout the PCAs bilaterally without high-grade stenosis. PCAs are patent to their distal aspects. Venous sinuses: Not well assessed due to timing of the contrast  bolus, but grossly patent. Anatomic variants: Hypoplastic right vertebral artery. Delayed phase: Scattered multifocal areas of serpiginous cortical enhancement seen at the areas of evolving infarction involving the right frontal and parietal lobes as well as the left occipital lobe, consistent with subacute infarcts. No other pathologic enhancement. Review of the MIP images confirms the above findings IMPRESSION: 1. 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 Little to no collateral flow seen distally within the right MCA territory by CTA. 2. 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 Moderate approximate 50% atheromatous stenosis at the left V4 segment. Dominant left vertebral artery and remainder the vertebrobasilar circulation otherwise patent. 3. Moderate carotid siphon atherosclerotic change with associated moderate to advanced multifocal stenoses. 4. Short-segment 50% stenosis at the origin of the left CCA. 5. Short-segment 60% stenosis involving the proximal left subclavian artery, distal to the origin of the left vertebral artery. 6. 16 mm soft tissue lesion involving the subcutaneous fat of the anterior right neck, indeterminate, but suspected to reflect a sebaceous cyst. Correlation with physical exam recommended. 7. Emphysema with 5 mm right upper lobe nodule, indeterminate. No follow-up needed if patient is low-risk. Non-contrast chest CT can be considered in 12 months if patient is high-risk. This recommendation follows the consensus statement: Guidelines for Management of Incidental Pulmonary Nodules Detected on CT Images: From the Fleischner Society 2017; Radiology 2017; 284:228-243. Electronically Signed   By: Jeannine Boga M.D.    On: 08/06/2018 02:17   Mr Virgel Paling PN Contrast  Result Date: 08/05/2018 CLINICAL DATA:  52 y/o M; constant and progressive vision loss for 2 weeks. EXAM: MRI HEAD WITHOUT CONTRAST MRA HEAD WITHOUT CONTRAST TECHNIQUE: Multiplanar, multiecho pulse sequences of the brain and surrounding structures were obtained without intravenous contrast. Angiographic images of the head were obtained using MRA technique without contrast. COMPARISON:  08/04/2018 CT head. 03/17/2015 MRI head. 03/17/2015 CT angiogram head and neck. FINDINGS: MRI HEAD FINDINGS Brain: Large areas of reduced diffusion compatible with acute/early subacute infarction in the right superior frontal lobe and right parietooccipital lobe as well as small areas of infarction scattered throughout the bilateral cerebral hemispheres and the left cerebellum. Areas of acute infarction demonstrate T2 FLAIR hyperintense signal abnormality. There is no associated susceptibility hypointensity to indicate acute intracranial hemorrhage. There multiple small chronic cortical infarctions predominantly in the right frontal and right parietal lobes with additional small foci in left parietal lobe and left frontal periventricular white matter. There are a few areas of susceptibility hypointensity scattered throughout the brain predominantly posteriorly compatible hemosiderin deposition of chronic microhemorrhage. No herniation, extra-axial collection, hydrocephalus, or focal mass effect. Vascular: As below. Skull and upper cervical spine: Normal marrow signal. Sinuses/Orbits: Negative. Other: None. MRA HEAD FINDINGS Motion degraded study. Anterior circulation: Right proximal M1 occlusion is new from prior CT angiogram of the head. Bilateral internal carotid arteries, bilateral ACA, and the left MCA are patent. No additional large vessel occlusion, segment of high-grade stenosis, or aneurysm identified. Atherosclerosis of the carotid siphons with mild bilateral stenosis.  Posterior circulation: No large vessel occlusion, aneurysm, or significant stenosis is identified. Left dominant vertebrobasilar system as seen on prior CT angiogram. IMPRESSION: 1. Large acute/early subacute infarctions involving the right superior frontal lobe and the right parieto-occipital lobe. Several additional scattered subcentimeter foci of recent infarction throughout the cerebral hemispheres and the left cerebellum. No associated acute hemorrhage or mass effect. 2. Right M1 occlusion is new from 2016. 3. Several small chronic infarcts, the largest involving right  frontal lobe and right parietal lobe. These results will be called to the ordering clinician or representative by the Radiologist Assistant, and communication documented in the PACS or zVision Dashboard. Electronically Signed   By: Kristine Garbe M.D.   On: 08/05/2018 16:54   Mr Brain Wo Contrast  Result Date: 08/05/2018 CLINICAL DATA:  52 y/o M; constant and progressive vision loss for 2 weeks. EXAM: MRI HEAD WITHOUT CONTRAST MRA HEAD WITHOUT CONTRAST TECHNIQUE: Multiplanar, multiecho pulse sequences of the brain and surrounding structures were obtained without intravenous contrast. Angiographic images of the head were obtained using MRA technique without contrast. COMPARISON:  08/04/2018 CT head. 03/17/2015 MRI head. 03/17/2015 CT angiogram head and neck. FINDINGS: MRI HEAD FINDINGS Brain: Large areas of reduced diffusion compatible with acute/early subacute infarction in the right superior frontal lobe and right parietooccipital lobe as well as small areas of infarction scattered throughout the bilateral cerebral hemispheres and the left cerebellum. Areas of acute infarction demonstrate T2 FLAIR hyperintense signal abnormality. There is no associated susceptibility hypointensity to indicate acute intracranial hemorrhage. There multiple small chronic cortical infarctions predominantly in the right frontal and right parietal lobes  with additional small foci in left parietal lobe and left frontal periventricular white matter. There are a few areas of susceptibility hypointensity scattered throughout the brain predominantly posteriorly compatible hemosiderin deposition of chronic microhemorrhage. No herniation, extra-axial collection, hydrocephalus, or focal mass effect. Vascular: As below. Skull and upper cervical spine: Normal marrow signal. Sinuses/Orbits: Negative. Other: None. MRA HEAD FINDINGS Motion degraded study. Anterior circulation: Right proximal M1 occlusion is new from prior CT angiogram of the head. Bilateral internal carotid arteries, bilateral ACA, and the left MCA are patent. No additional large vessel occlusion, segment of high-grade stenosis, or aneurysm identified. Atherosclerosis of the carotid siphons with mild bilateral stenosis. Posterior circulation: No large vessel occlusion, aneurysm, or significant stenosis is identified. Left dominant vertebrobasilar system as seen on prior CT angiogram. IMPRESSION: 1. Large acute/early subacute infarctions involving the right superior frontal lobe and the right parieto-occipital lobe. Several additional scattered subcentimeter foci of recent infarction throughout the cerebral hemispheres and the left cerebellum. No associated acute hemorrhage or mass effect. 2. Right M1 occlusion is new from 2016. 3. Several small chronic infarcts, the largest involving right frontal lobe and right parietal lobe. These results will be called to the ordering clinician or representative by the Radiologist Assistant, and communication documented in the PACS or zVision Dashboard. Electronically Signed   By: Kristine Garbe M.D.   On: 08/05/2018 16:54    Microbiology: No results found for this or any previous visit (from the past 240 hour(s)).   Labs: Basic Metabolic Panel: No results for input(s): NA, K, CL, CO2, GLUCOSE, BUN, CREATININE, CALCIUM, MG, PHOS in the last 168  hours. Liver Function Tests: No results for input(s): AST, ALT, ALKPHOS, BILITOT, PROT, ALBUMIN in the last 168 hours. No results for input(s): LIPASE, AMYLASE in the last 168 hours. No results for input(s): AMMONIA in the last 168 hours. CBC: No results for input(s): WBC, NEUTROABS, HGB, HCT, MCV, PLT in the last 168 hours. Cardiac Enzymes: No results for input(s): CKTOTAL, CKMB, CKMBINDEX, TROPONINI in the last 168 hours. BNP: BNP (last 3 results) No results for input(s): BNP in the last 8760 hours.  ProBNP (last 3 results) No results for input(s): PROBNP in the last 8760 hours.  CBG: No results for input(s): GLUCAP in the last 168 hours.     Signed:  Domenic Polite MD.  [facility] 08/19/2018, 4:41 PM

## 2018-09-21 ENCOUNTER — Ambulatory Visit: Payer: Medicare HMO | Admitting: Adult Health

## 2018-09-21 ENCOUNTER — Telehealth: Payer: Self-pay

## 2018-09-21 NOTE — Telephone Encounter (Signed)
Pt no show for appt today.

## 2018-09-21 NOTE — Progress Notes (Deleted)
[facility] [address] [place]. [place] [address] [phone]       OFFICE FOLLOW UP NOTE  [name] Date of Birth:  1966/10/15 Medical Record Number:  [id number]   Reason for Referral:  hospital stroke follow up  CHIEF COMPLAINT:  No chief complaint on file.   HPI: Douglas Fowler is being seen today for initial visit in the office for multiple embolic right-sided infarcts secondary to unknown source on 08/05/2018. History obtained from *** and chart review. Reviewed all radiology images and labs personally.  [name] is a 52 y.o. male with history of CAD, CVA, HTN, HLD, tobacco  who presented to [facility] with constant and progressivevision loss for the past 2 weeks.   Patient does have a history of infarct in the past in 2017.  CT head showed new right frontal and parietal lobe density concerning for new ischemic stroke.  He did not receive IV t-PA due to late presentation.  Patient was transferred to [facility] for further evaluation.  MRI head reviewed and showed a large acute/early subacute infarctions involving the right superior frontal lobe in the right parietal occipital lobe with several additional scattered subcentimeter foci of recent infarction throughout the cerebral hemispheres and left cerebellum.  MRA head showed right M1 occlusion which was new from prior imaging in 2016.  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  Carotid Dopplers showed right vertebral artery bidirectional flow.  2D echo showed an EF of 50 to 55% without cardiac source of embolus.  It was felt as though multiple right-sided infarcts were embolic without known source but no further work-up was pursued such as a TEE/loop to assess for embolic source as patient would not be a good candidate for anticoagulation due to EtOH history and  poor medical compliance along with fall/safety concerns from poor vision.  Patient was not on antithrombotic PTA and recommended aspirin 325 mg daily.  LDL 104 and recommended initiating Lipitor 40 mg daily.  HTN stable during admission recommended long-term BP goal normotensive range.  After evaluation from therapy, it was recommended for home health therapy with 24/7 supervision due to fall/safety concerns and he was discharged home in stable condition.    ROS:   14 system review of systems performed and negative with exception of ***  PMH:  Past Medical History:  Diagnosis Date  . Hypertension     PSH: No past surgical history on file.  Social History:  Social History   Socioeconomic History  . Marital status: Married    Spouse name: Not on file  . Number of children: Not on file  . Years of education: Not on file  . Highest education level: Not on file  Occupational History  . Not on file  Social Needs  . Financial resource strain: Not on file  . Food insecurity:    Worry: Not on file    Inability: Not on file  . Transportation needs:    Medical: Not on file    Non-medical: Not on file  Tobacco Use  . Smoking status: Not on file  Substance and Sexual Activity  . Alcohol use: Not on file  . Drug use: Not on file  . Sexual activity: Not on file  Lifestyle  . Physical activity:    Days per week: Not on file  Minutes per session: Not on file  . Stress: Not on file  Relationships  . Social connections:    Talks on phone: Not on file    Gets together: Not on file    Attends religious service: Not on file    Active member of club or organization: Not on file    Attends meetings of clubs or organizations: Not on file    Relationship status: Not on file  . Intimate partner violence:    Fear of current or ex partner: Not on file    Emotionally abused: Not on file    Physically abused: Not on file    Forced sexual activity: Not on file  Other Topics Concern  .  Not on file  Social History Narrative  . Not on file    Family History: No family history on file.  Medications:   Current Outpatient Medications on File Prior to Visit  Medication Sig Dispense Refill  . amLODipine (NORVASC) 10 MG tablet Take 1 tablet (10 mg total) by mouth daily. 30 tablet 0  . aspirin 325 MG tablet Take 1 tablet (325 mg total) by mouth daily. 30 tablet 0  . atorvastatin (LIPITOR) 80 MG tablet Take 1 tablet (80 mg total) by mouth daily at 6 PM. 30 tablet 0  . carvedilol (COREG) 3.125 MG tablet Take 1 tablet (3.125 mg total) by mouth 2 (two) times daily with a meal. 60 tablet 0  . nicotine (NICODERM CQ - DOSED IN MG/24 HOURS) 14 mg/24hr patch Place 1 patch (14 mg total) onto the skin daily. 28 patch 0   No current facility-administered medications on file prior to visit.     Allergies:  No Known Allergies   Physical Exam  There were no vitals filed for this visit. There is no height or weight on file to calculate BMI. No exam data present  General: well developed, well nourished, seated, in no evident distress Head: head normocephalic and atraumatic.   Neck: supple with no carotid or supraclavicular bruits Cardiovascular: regular rate and rhythm, no murmurs Musculoskeletal: no deformity Skin:  no rash/petichiae Vascular:  Normal pulses all extremities  Neurologic Exam Mental Status: Awake and fully alert. Oriented to place and time. Recent and remote memory intact. Attention span, concentration and fund of knowledge appropriate. Mood and affect appropriate.  Cranial Nerves: Fundoscopic exam reveals sharp disc margins. Pupils equal, briskly reactive to light. Extraocular movements full without nystagmus. Visual fields full to confrontation. Hearing intact. Facial sensation intact. Face, tongue, palate moves normally and symmetrically.  Motor: Normal bulk and tone. Normal strength in all tested extremity muscles. Sensory.: intact to touch , pinprick , position  and vibratory sensation.  Coordination: Rapid alternating movements normal in all extremities. Finger-to-nose and heel-to-shin performed accurately bilaterally. Gait and Station: Arises from chair without difficulty. Stance is normal. Gait demonstrates normal stride length and balance . Able to heel, toe and tandem walk without difficulty.  Reflexes: 1+ and symmetric. Toes downgoing.    NIHSS  *** Modified Rankin  *** HAS-BLED *** CHA2DS2-VASc ***   Diagnostic Data (Labs, Imaging, Testing)  Ct Angio Head W Or Wo Contrast Ct Angio Neck W Or Wo Contrast 08/06/2018 IMPRESSION:  1. 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 Little to no collateral flow seen distally within the right MCA territory by CTA.  2. 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 Moderate approximate 50% atheromatous stenosis at the left V4 segment. Dominant left vertebral artery and  remainder the vertebrobasilar circulation otherwise patent.  3. Moderate carotid siphon atherosclerotic change with associated moderate to advanced multifocal stenoses.  4. Short-segment 50% stenosis at the origin of the left CCA.  5. Short-segment 60% stenosis involving the proximal left subclavian artery, distal to the origin of the left vertebral artery.  6. 16 mm soft tissue lesion involving the subcutaneous fat of the anterior right neck, indeterminate, but suspected to reflect a sebaceous cyst. Correlation with physical exam recommended.  7. Emphysema with 5 mm right upper lobe nodule, indeterminate. No follow-up needed if patient is low-risk. Non-contrast chest CT can be considered in 12 months if patient is high-risk.    Mr Douglas Fowler Head Wo Contrast 08/05/2018 IMPRESSION:  1. Large acute/early subacute infarctions involving the right superior frontal lobe and the right parieto-occipital lobe. Several additional scattered subcentimeter foci of recent infarction throughout the cerebral hemispheres  and the left cerebellum. No associated acute hemorrhage or mass effect.  2. Right M1 occlusion is new from 2016.  3. Several small chronic infarcts, the largest involving right frontal lobe and right parietal lobe.    Transthoracic Echocardiogram  08/05/2018 Study Conclusions  - Left ventricle: The cavity size was normal. Wall thickness was normal. Systolic function was normal. The estimated ejection fraction was in the range of 50% to 55%. - Aortic valve: There was mild to moderate regurgitation. Valve area (VTI): 2.34 cm^2. Valve area (Vmax): 2.35 cm^2. Valve area (Vmean): 2.26 cm^2. - Mitral valve: There was mild regurgitation. - Left atrium: The atrium was mildly dilated.    Bilateral Carotid Dopplers  08/06/2018 Final Interpretation: Right Carotid: Velocities in the right ICA are consistent with a 1-39% stenosis. Left Carotid: Velocities in the left ICA are consistent with a 1-39% stenosis. Vertebrals: Left vertebral artery demonstrates antegrade flow.              Right vertebral artery demonstrates bidirectional flow.    ASSESSMENT: Douglas Fowler is a 52 y.o. year old male here with multiple embolic right-sided infarcts on 08/05/2018 secondary to unknown source but further embolic investigation was not recommended due to patient being a poor candidate for anticoagulation due to EtOH history, prior noncompliance history and safety concern due to increased falls with decreased vision. Vascular risk factors include CAD, CVA, HTN, HLD and tobacco abuse.     PLAN: -Continue {anticoagulants:31417}  and ***  for secondary stroke prevention -F/u with PCP regarding your *** management -continue to monitor BP at home -advised to continue to stay active and maintain a healthy diet -Maintain strict control of hypertension with blood pressure goal below 130/90, diabetes with hemoglobin A1c goal below 6.5% and cholesterol with LDL cholesterol (bad cholesterol) goal below 70  mg/dL. I also advised the patient to eat a healthy diet with plenty of whole grains, cereals, fruits and vegetables, exercise regularly and maintain ideal body weight.  Follow up in *** or call earlier if needed   Greater than 50% of time during this 25 minute visit was spent on counseling,explanation of diagnosis of ***, reviewing risk factor management of ***, planning of further management, discussion with patient and family and coordination of care    Venancio Poisson, [facility]  [facility] [address] [place], [place] [address]  Phone [phone] Fax [phone] Note: This document was prepared with digital dictation and possible smart phrase technology. Any transcriptional errors that result from this process are unintentional.

## 2018-09-22 ENCOUNTER — Encounter: Payer: Self-pay | Admitting: Adult Health

## 2018-12-10 ENCOUNTER — Inpatient Hospital Stay (HOSPITAL_COMMUNITY)
Admission: AD | Admit: 2018-12-10 | Discharge: 2018-12-18 | DRG: 897 | Disposition: A | Discharge status: Home / Self Care | Payer: Medicare HMO | Source: Other Acute Inpatient Hospital | Attending: Internal Medicine | Admitting: Internal Medicine

## 2018-12-10 DIAGNOSIS — E872 Acidosis: Secondary | ICD-10-CM | POA: Diagnosis present

## 2018-12-10 DIAGNOSIS — D72829 Elevated white blood cell count, unspecified: Secondary | ICD-10-CM | POA: Diagnosis not present

## 2018-12-10 DIAGNOSIS — I2510 Atherosclerotic heart disease of native coronary artery without angina pectoris: Secondary | ICD-10-CM | POA: Diagnosis present

## 2018-12-10 DIAGNOSIS — R569 Unspecified convulsions: Secondary | ICD-10-CM | POA: Diagnosis not present

## 2018-12-10 DIAGNOSIS — N182 Chronic kidney disease, stage 2 (mild): Secondary | ICD-10-CM | POA: Diagnosis present

## 2018-12-10 DIAGNOSIS — E876 Hypokalemia: Secondary | ICD-10-CM | POA: Diagnosis not present

## 2018-12-10 DIAGNOSIS — I10 Essential (primary) hypertension: Secondary | ICD-10-CM | POA: Diagnosis present

## 2018-12-10 DIAGNOSIS — I129 Hypertensive chronic kidney disease with stage 1 through stage 4 chronic kidney disease, or unspecified chronic kidney disease: Secondary | ICD-10-CM | POA: Diagnosis present

## 2018-12-10 DIAGNOSIS — F10931 Alcohol use, unspecified with withdrawal delirium: Secondary | ICD-10-CM | POA: Diagnosis present

## 2018-12-10 DIAGNOSIS — Z6830 Body mass index (BMI) 30.0-30.9, adult: Secondary | ICD-10-CM

## 2018-12-10 DIAGNOSIS — Z905 Acquired absence of kidney: Secondary | ICD-10-CM | POA: Diagnosis not present

## 2018-12-10 DIAGNOSIS — T434X5A Adverse effect of butyrophenone and thiothixene neuroleptics, initial encounter: Secondary | ICD-10-CM | POA: Diagnosis not present

## 2018-12-10 DIAGNOSIS — D638 Anemia in other chronic diseases classified elsewhere: Secondary | ICD-10-CM | POA: Diagnosis present

## 2018-12-10 DIAGNOSIS — I69334 Monoplegia of upper limb following cerebral infarction affecting left non-dominant side: Secondary | ICD-10-CM | POA: Diagnosis not present

## 2018-12-10 DIAGNOSIS — Y901 Blood alcohol level of 20-39 mg/100 ml: Secondary | ICD-10-CM | POA: Diagnosis present

## 2018-12-10 DIAGNOSIS — S01512A Laceration without foreign body of oral cavity, initial encounter: Secondary | ICD-10-CM | POA: Diagnosis not present

## 2018-12-10 DIAGNOSIS — Z781 Physical restraint status: Secondary | ICD-10-CM

## 2018-12-10 DIAGNOSIS — D649 Anemia, unspecified: Secondary | ICD-10-CM | POA: Diagnosis not present

## 2018-12-10 DIAGNOSIS — W19XXXA Unspecified fall, initial encounter: Secondary | ICD-10-CM | POA: Diagnosis not present

## 2018-12-10 DIAGNOSIS — E663 Overweight: Secondary | ICD-10-CM | POA: Diagnosis not present

## 2018-12-10 DIAGNOSIS — F10939 Alcohol use, unspecified with withdrawal, unspecified: Secondary | ICD-10-CM | POA: Diagnosis present

## 2018-12-10 DIAGNOSIS — I25119 Atherosclerotic heart disease of native coronary artery with unspecified angina pectoris: Secondary | ICD-10-CM | POA: Diagnosis not present

## 2018-12-10 DIAGNOSIS — Z79899 Other long term (current) drug therapy: Secondary | ICD-10-CM | POA: Diagnosis not present

## 2018-12-10 DIAGNOSIS — F0390 Unspecified dementia without behavioral disturbance: Secondary | ICD-10-CM | POA: Diagnosis present

## 2018-12-10 DIAGNOSIS — K7010 Alcoholic hepatitis without ascites: Secondary | ICD-10-CM | POA: Diagnosis present

## 2018-12-10 DIAGNOSIS — E785 Hyperlipidemia, unspecified: Secondary | ICD-10-CM | POA: Diagnosis present

## 2018-12-10 DIAGNOSIS — F10239 Alcohol dependence with withdrawal, unspecified: Secondary | ICD-10-CM | POA: Diagnosis present

## 2018-12-10 DIAGNOSIS — F10231 Alcohol dependence with withdrawal delirium: Secondary | ICD-10-CM | POA: Diagnosis present

## 2018-12-10 DIAGNOSIS — I248 Other forms of acute ischemic heart disease: Secondary | ICD-10-CM | POA: Diagnosis present

## 2018-12-10 DIAGNOSIS — I639 Cerebral infarction, unspecified: Secondary | ICD-10-CM | POA: Diagnosis present

## 2018-12-10 DIAGNOSIS — Z7982 Long term (current) use of aspirin: Secondary | ICD-10-CM | POA: Diagnosis not present

## 2018-12-10 DIAGNOSIS — N179 Acute kidney failure, unspecified: Secondary | ICD-10-CM | POA: Diagnosis present

## 2018-12-10 DIAGNOSIS — G4089 Other seizures: Secondary | ICD-10-CM | POA: Diagnosis present

## 2018-12-10 DIAGNOSIS — I6300 Cerebral infarction due to thrombosis of unspecified precerebral artery: Secondary | ICD-10-CM | POA: Diagnosis not present

## 2018-12-10 MED ORDER — LORAZEPAM 2 MG/ML IJ SOLN
2.0000 mg | INTRAMUSCULAR | Status: DC | PRN
  Administered 2018-12-10 – 2018-12-11 (×2): 3 mg via INTRAVENOUS
  Filled 2018-12-10 (×2): qty 2

## 2018-12-10 MED ORDER — FOLIC ACID 5 MG/ML IJ SOLN
1.0000 mg | Freq: Every day | INTRAMUSCULAR | Status: DC
  Administered 2018-12-11 – 2018-12-14 (×4): 1 mg via INTRAVENOUS
  Filled 2018-12-10 (×4): qty 0.2

## 2018-12-10 MED ORDER — INFLUENZA VAC SPLIT QUAD 0.5 ML IM SUSY: 0.5000 mL | PREFILLED_SYRINGE | INTRAMUSCULAR | Status: DC

## 2018-12-10 MED ORDER — PNEUMOCOCCAL VAC POLYVALENT 25 MCG/0.5ML IJ INJ: 0.5000 mL | INJECTION | INTRAMUSCULAR | Status: DC

## 2018-12-10 MED ORDER — THIAMINE HCL 100 MG/ML IJ SOLN
100.0000 mg | Freq: Every day | INTRAMUSCULAR | Status: DC
  Administered 2018-12-11 – 2018-12-14 (×4): 100 mg via INTRAVENOUS
  Filled 2018-12-10 (×4): qty 2

## 2018-12-10 MED ORDER — SODIUM CHLORIDE 0.9 % IV SOLN
INTRAVENOUS | Status: AC
  Administered 2018-12-11 (×2): via INTRAVENOUS

## 2018-12-11 ENCOUNTER — Other Ambulatory Visit: Payer: Self-pay

## 2018-12-11 ENCOUNTER — Inpatient Hospital Stay (HOSPITAL_COMMUNITY): Payer: Medicare HMO

## 2018-12-11 ENCOUNTER — Encounter (HOSPITAL_COMMUNITY): Payer: Self-pay | Admitting: Internal Medicine

## 2018-12-11 DIAGNOSIS — F10231 Alcohol dependence with withdrawal delirium: Secondary | ICD-10-CM | POA: Diagnosis present

## 2018-12-11 DIAGNOSIS — F10931 Alcohol use, unspecified with withdrawal delirium: Secondary | ICD-10-CM | POA: Diagnosis present

## 2018-12-11 DIAGNOSIS — F10939 Alcohol use, unspecified with withdrawal, unspecified: Secondary | ICD-10-CM

## 2018-12-11 DIAGNOSIS — I10 Essential (primary) hypertension: Secondary | ICD-10-CM

## 2018-12-11 DIAGNOSIS — F10239 Alcohol dependence with withdrawal, unspecified: Secondary | ICD-10-CM | POA: Diagnosis present

## 2018-12-11 DIAGNOSIS — R569 Unspecified convulsions: Secondary | ICD-10-CM

## 2018-12-11 DIAGNOSIS — I25119 Atherosclerotic heart disease of native coronary artery with unspecified angina pectoris: Secondary | ICD-10-CM

## 2018-12-11 LAB — BASIC METABOLIC PANEL
ANION GAP: 12 (ref 5–15)
Anion gap: 11 (ref 5–15)
BUN: 10 mg/dL (ref 6–20)
BUN: 9 mg/dL (ref 6–20)
CHLORIDE: 102 mmol/L (ref 98–111)
CO2: 24 mmol/L (ref 22–32)
CO2: 22 mmol/L (ref 22–32)
Calcium: 8 mg/dL — ABNORMAL LOW (ref 8.9–10.3)
Calcium: 7.6 mg/dL — ABNORMAL LOW (ref 8.9–10.3)
Chloride: 103 mmol/L (ref 98–111)
Creatinine, Ser: 1.27 mg/dL — ABNORMAL HIGH (ref 0.61–1.24)
Creatinine, Ser: 1.24 mg/dL (ref 0.61–1.24)
GFR calc non Af Amer: >60 mL/min (ref >60)
GFR calc non Af Amer: >60 mL/min (ref >60)
Glucose, Bld: 94 mg/dL (ref 70–99)
Glucose, Bld: 86 mg/dL (ref 70–99)
POTASSIUM: 4.3 mmol/L (ref 3.5–5.1)
Potassium: 4 mmol/L (ref 3.5–5.1)
SODIUM: 137 mmol/L (ref 135–145)
Sodium: 137 mmol/L (ref 135–145)

## 2018-12-11 LAB — TROPONIN I
Troponin I: 0.14 ng/mL (ref <0.03)
Troponin I: 0.2 ng/mL (ref <0.03)
Troponin I: 0.19 ng/mL (ref <0.03)

## 2018-12-11 LAB — CBC
HEMATOCRIT: 38.2 % — AB (ref 39.0–52.0)
Hemoglobin: 12.8 g/dL — ABNORMAL LOW (ref 13.0–17.0)
MCH: 31.3 pg (ref 26.0–34.0)
MCHC: 33.5 g/dL (ref 30.0–36.0)
MCV: 93.4 fL (ref 80.0–100.0)
Platelets: 137 10*3/uL — ABNORMAL LOW (ref 150–400)
RBC: 4.09 MIL/uL — ABNORMAL LOW (ref 4.22–5.81)
RDW: 16.3 % — ABNORMAL HIGH (ref 11.5–15.5)
WBC: 5.6 10*3/uL (ref 4.0–10.5)
nRBC: 0 % (ref 0.0–0.2)

## 2018-12-11 LAB — CBC WITH DIFFERENTIAL/PLATELET
Abs Immature Granulocytes: 0.03 10*3/uL (ref 0.00–0.07)
BASOS ABS: 0.1 10*3/uL (ref 0.0–0.1)
Basophils Relative: 1 %
EOS ABS: 0 10*3/uL (ref 0.0–0.5)
Eosinophils Relative: 0 %
HEMATOCRIT: 40.7 % (ref 39.0–52.0)
Hemoglobin: 13.9 g/dL (ref 13.0–17.0)
Immature Granulocytes: 0 %
Lymphocytes Relative: 13 %
Lymphs Abs: 0.8 10*3/uL (ref 0.7–4.0)
MCH: 31.7 pg (ref 26.0–34.0)
MCHC: 34.2 g/dL (ref 30.0–36.0)
MCV: 92.7 fL (ref 80.0–100.0)
Monocytes Absolute: 0.9 10*3/uL (ref 0.1–1.0)
Monocytes Relative: 14 %
Neutro Abs: 4.8 10*3/uL (ref 1.7–7.7)
Neutrophils Relative %: 72 %
Platelets: 150 10*3/uL (ref 150–400)
RBC: 4.39 MIL/uL (ref 4.22–5.81)
RDW: 16.3 % — ABNORMAL HIGH (ref 11.5–15.5)
WBC: 6.7 10*3/uL (ref 4.0–10.5)
nRBC: 0 % (ref 0.0–0.2)

## 2018-12-11 LAB — LACTIC ACID, PLASMA
Lactic Acid, Venous: 1.4 mmol/L (ref 0.5–1.9)
Lactic Acid, Venous: 1 mmol/L (ref 0.5–1.9)

## 2018-12-11 LAB — HEPATIC FUNCTION PANEL
ALBUMIN: 3.5 g/dL (ref 3.5–5.0)
ALT: 76 U/L — ABNORMAL HIGH (ref 0–44)
AST: 100 U/L — ABNORMAL HIGH (ref 15–41)
Alkaline Phosphatase: 60 U/L (ref 38–126)
Bilirubin, Direct: 0.3 mg/dL — ABNORMAL HIGH (ref 0.0–0.2)
Indirect Bilirubin: 1.1 mg/dL — ABNORMAL HIGH (ref 0.3–0.9)
TOTAL PROTEIN: 6.5 g/dL (ref 6.5–8.1)
Total Bilirubin: 1.4 mg/dL — ABNORMAL HIGH (ref 0.3–1.2)

## 2018-12-11 LAB — MAGNESIUM
Magnesium: 1.7 mg/dL (ref 1.7–2.4)
Magnesium: 2.2 mg/dL (ref 1.7–2.4)

## 2018-12-11 LAB — CK: Total CK: 231 U/L (ref 49–397)

## 2018-12-11 LAB — PHOSPHORUS: Phosphorus: 2.6 mg/dL (ref 2.5–4.6)

## 2018-12-11 LAB — MRSA PCR SCREENING: MRSA by PCR: NEGATIVE

## 2018-12-11 LAB — AMMONIA: Ammonia: 24 umol/L (ref 9–35)

## 2018-12-11 LAB — GLUCOSE, CAPILLARY
Glucose-Capillary: 91 mg/dL (ref 70–99)
Glucose-Capillary: 76 mg/dL (ref 70–99)
Glucose-Capillary: 77 mg/dL (ref 70–99)

## 2018-12-11 MED ORDER — LORAZEPAM 2 MG/ML IJ SOLN
1.0000 mg | INTRAMUSCULAR | Status: DC | PRN
  Administered 2018-12-11: 4 mg via INTRAVENOUS
  Administered 2018-12-11: 2 mg via INTRAVENOUS
  Administered 2018-12-11: 4 mg via INTRAVENOUS
  Administered 2018-12-11: 3 mg via INTRAVENOUS
  Administered 2018-12-12 – 2018-12-17 (×9): 2 mg via INTRAVENOUS
  Filled 2018-12-11 (×3): qty 1
  Filled 2018-12-11: qty 2
  Filled 2018-12-11: qty 1
  Filled 2018-12-11: qty 2
  Filled 2018-12-11 (×2): qty 1
  Filled 2018-12-11: qty 2
  Filled 2018-12-11 (×4): qty 1

## 2018-12-11 MED ORDER — HYDRALAZINE HCL 20 MG/ML IJ SOLN
5.0000 mg | INTRAMUSCULAR | Status: DC | PRN
  Administered 2018-12-11 – 2018-12-15 (×8): 5 mg via INTRAVENOUS
  Filled 2018-12-11 (×8): qty 1

## 2018-12-11 MED ORDER — LORAZEPAM 2 MG/ML IJ SOLN
2.0000 mg | INTRAMUSCULAR | Status: DC
  Administered 2018-12-11 (×8): 2 mg via INTRAVENOUS
  Administered 2018-12-11: 1 mg via INTRAVENOUS
  Administered 2018-12-11 – 2018-12-13 (×12): 2 mg via INTRAVENOUS
  Filled 2018-12-11 (×22): qty 1

## 2018-12-11 MED ORDER — ACETAMINOPHEN 650 MG RE SUPP: 650.0000 mg | Freq: Four times a day (QID) | RECTAL | Status: DC | PRN

## 2018-12-11 MED ORDER — CHLORHEXIDINE GLUCONATE 0.12 % MT SOLN
15.0000 mL | Freq: Two times a day (BID) | OROMUCOSAL | Status: DC
  Administered 2018-12-11 – 2018-12-18 (×15): 15 mL via OROMUCOSAL
  Filled 2018-12-11 (×15): qty 15

## 2018-12-11 MED ORDER — HEPARIN SODIUM (PORCINE) 5000 UNIT/ML IJ SOLN: 5000.0000 [IU] | Freq: Three times a day (TID) | INTRAMUSCULAR | Status: DC

## 2018-12-11 MED ORDER — LEVETIRACETAM IN NACL 500 MG/100ML IV SOLN
500.0000 mg | Freq: Two times a day (BID) | INTRAVENOUS | Status: DC
  Administered 2018-12-11 – 2018-12-14 (×7): 500 mg via INTRAVENOUS
  Filled 2018-12-11 (×7): qty 100

## 2018-12-11 MED ORDER — ACETAMINOPHEN 325 MG PO TABS: 650.0000 mg | ORAL_TABLET | Freq: Four times a day (QID) | ORAL | Status: DC | PRN

## 2018-12-11 MED ORDER — CHLORDIAZEPOXIDE HCL 5 MG PO CAPS: 10.0000 mg | ORAL_CAPSULE | Freq: Four times a day (QID) | ORAL | Status: DC

## 2018-12-11 MED ORDER — ONDANSETRON HCL 4 MG PO TABS: 4.0000 mg | ORAL_TABLET | Freq: Four times a day (QID) | ORAL | Status: DC | PRN

## 2018-12-11 MED ORDER — ONDANSETRON HCL 4 MG/2ML IJ SOLN: 4.0000 mg | Freq: Four times a day (QID) | INTRAMUSCULAR | Status: DC | PRN

## 2018-12-11 MED ORDER — MAGNESIUM SULFATE 2 GM/50ML IV SOLN
2.0000 g | Freq: Once | INTRAVENOUS | Status: AC
  Administered 2018-12-11: 2 g via INTRAVENOUS
  Filled 2018-12-11: qty 50

## 2018-12-11 MED ORDER — ORAL CARE MOUTH RINSE
15.0000 mL | Freq: Two times a day (BID) | OROMUCOSAL | Status: DC
  Administered 2018-12-11 – 2018-12-14 (×8): 15 mL via OROMUCOSAL

## 2018-12-11 MED ORDER — HEPARIN SODIUM (PORCINE) 5000 UNIT/ML IJ SOLN
5000.0000 [IU] | Freq: Three times a day (TID) | INTRAMUSCULAR | Status: DC
  Administered 2018-12-11 – 2018-12-13 (×8): 5000 [IU] via SUBCUTANEOUS
  Filled 2018-12-11 (×8): qty 1

## 2018-12-11 NOTE — Progress Notes (Signed)
Chaplain responded to spiritual consult. Patient may be interested in Advanced directive.  Staff was actively working bedside with patient when chaplain arrived.  Wife, tearful, was outside in hall. Chaplain spoke with wife about purpose of visit.  Wife said patient was not able to sign papers and not "with it."  Chaplain provided ministry of presence with wife, discussing addiction issues.  Will continue to be available. Tamsen Snider Pager [phone]

## 2018-12-11 NOTE — Consult Note (Signed)
NAME:  Douglas Fowler, MRN:  [id number], DOB:  1966-06-23, LOS: 1 ADMISSION DATE:  12/10/2018, CONSULTATION DATE:  12/11/18 REFERRING MD:  Steffanie Dunn  CHIEF COMPLAINT:  EtOH withdrawal   Brief History   Douglas Fowler is a 53 y.o. male who was transferred from [place] for EtOH withdrawal and seizures.  Accepted to med surg by [facility] and after admission to [facility], PCCM called due to agitation.  History of present illness   Pt is asleep; therefore, this HPI is obtained from chart review and pt's wife (avoiding waking pt given agitation earlier). Douglas Fowler is a 53 y.o. male who has a PMH as outlined below (see "past medical history").  He was taken to [facility] 1/9 after his wife found him on the ground minimally responsive when she returned home from work. She also noted blood on his mouth / tongue.  EMS was called and pt was taken to [facility] where he was noted to have a tonic clonic seizure.  He was treated with ativan which broke his seizure.  Seizure was felt to be due to EtOH withdrawal given pt's excessive EtOH intake (per wife, he drinks at least 1 pint of liquor per day if not more).  His last drink was earlier on the day of seizure but wife states it was only 1/2 a shot.  At [facility], he was loaded with 1g Keppra and 2mg  ativan. CT of the head was negative for acute process and only showed prior infarcts.  Pt was later transferred to [facility].  After arrival to [facility], he had an episode of agitation and PCCM was called in consultation for consideration of precedex.  At the time of my evaluation, pt is asleep and resting comfortably. Vitals are normal with HR of 70.  Per wife, he was agitated earlier when he was getting blood work drawn.  She states right now is the most comfortable he has been all night.  Past Medical History  HTN, CVA, EtOH abuse  Significant Hospital Events   1/10 > admit.  Consults:  PCCM.  Procedures:  None.  Significant Diagnostic Tests:  CT head 1/9 >  Mild  atrophy with prior infarcts noted, most noteable right frontal and parietal lobes.  Micro Data:  None.  Antimicrobials:  None.   Interim history/subjective:  Asleep, appears comfortable. Vitals stable.  Objective:  Blood pressure (!) 163/77, pulse 70, resp. rate 16, SpO2 94 %.       No intake or output data in the 24 hours ending 12/11/18 0125 There were no vitals filed for this visit.  Examination: General: Adult male, in NAD. Neuro: Sleeping comfortably. HEENT: [place]/AT. Sclerae anicteric.  Tongue laceration per report (not seen on my exam as pt is sound asleep). Cardiovascular: RRR, no M/R/G.  Lungs: Respirations even and unlabored.  CTA bilaterally, No W/R/R.  Abdomen: BS x 4, soft, NT/ND.  Musculoskeletal: No gross deformities, no edema.  Skin: Intact, warm, no rashes.  Assessment & Plan:   EtOH withdrawal. - Continue ativan PRN per CIWA protocol. - Add scheduled Ativan 2mg  q2hrs as will avoid PO agents such as Librium given pt's tongue laceration and the fact that he gets agitated once awakened. - If additional medications are needed for agitation, can consider adding phenobarbital vs precedex. - Continue thiamine / folate. - EtOH cessation counseling.  Seizures felt to be due to EtOH withdrawal - none reported since [place] where he was loaded with 1g Keppra. - Continue keppra 500mg  BID for  now, wean to off. - If has recurrence, would get neurology to see.  AKI. - Continue fluids. - Follow BMP.  Hypomagnesemia. - 2g Mag.  Transaminitis - presumed due to EtOH. - Trend LFT's.  Troponin bump - ? Demand. - Trend troponin.  Rest per primary team.  Best Practice:  Diet: NPO. Pain/Anxiety/Delirium protocol (if indicated): N/A. VAP protocol (if indicated): N/A. DVT prophylaxis: SCD's / Heparin. GI prophylaxis: N/A. Glucose control: N/A. Mobility: Bedrest. Code Status: Full. Family Communication: Wife updated at bedside. Disposition: SDU.  Labs    CBC: Recent Labs  Lab 12/10/18 2351  WBC 6.7  NEUTROABS 4.8  HGB 13.9  HCT 40.7  MCV 92.7  PLT 889   Basic Metabolic Panel: No results for input(s): NA, K, CL, CO2, GLUCOSE, BUN, CREATININE, CALCIUM, MG, PHOS in the last 168 hours. GFR: CrCl cannot be calculated (Patient's most recent lab result is older than the maximum 21 days allowed.). Recent Labs  Lab 12/10/18 2351  WBC 6.7   Liver Function Tests: No results for input(s): AST, ALT, ALKPHOS, BILITOT, PROT, ALBUMIN in the last 168 hours. No results for input(s): LIPASE, AMYLASE in the last 168 hours. No results for input(s): AMMONIA in the last 168 hours. ABG No results found for: PHART, PCO2ART, PO2ART, HCO3, TCO2, ACIDBASEDEF, O2SAT  Coagulation Profile: No results for input(s): INR, PROTIME in the last 168 hours. Cardiac Enzymes: No results for input(s): CKTOTAL, CKMB, CKMBINDEX, TROPONINI in the last 168 hours. HbA1C: Hgb A1c MFr Bld  Date/Time Value Ref Range Status  08/05/2018 10:04 AM 5.6 4.8 - 5.6 % Final    Comment:    (NOTE) Pre diabetes:          5.7%-6.4% Diabetes:              >6.4% Glycemic control for   <7.0% adults with diabetes    CBG: No results for input(s): GLUCAP in the last 168 hours.  Review of Systems:   Unable to obtain as pt is asleep (avoiding waking up given agitation earlier).  Past medical history  He,  has a past medical history of Hypertension.   Surgical History   No past surgical history on file.   Social History      Family history   His family history is not on file.   Allergies No Known Allergies   Home meds  Prior to Admission medications   Medication Sig Start Date End Date Taking? Authorizing Provider  amLODipine (NORVASC) 10 MG tablet Take 1 tablet (10 mg total) by mouth daily. 08/08/18   Domenic Polite, MD  aspirin 325 MG tablet Take 1 tablet (325 mg total) by mouth daily. 08/08/18   Domenic Polite, MD  atorvastatin (LIPITOR) 80 MG tablet Take 1 tablet  (80 mg total) by mouth daily at 6 PM. 08/07/18   Domenic Polite, MD  carvedilol (COREG) 3.125 MG tablet Take 1 tablet (3.125 mg total) by mouth 2 (two) times daily with a meal. 08/07/18   Domenic Polite, MD  nicotine (NICODERM CQ - DOSED IN MG/24 HOURS) 14 mg/24hr patch Place 1 patch (14 mg total) onto the skin daily. 08/08/18   Domenic Polite, MD    Montey Hora, [facility] Pager: [phone].  If no answer, [phone] 12/11/2018, 1:25 AM

## 2018-12-11 NOTE — Procedures (Signed)
ELECTROENCEPHALOGRAM REPORT   Patient: Douglas Fowler       Room #: 4K10Z EEG No. ID: [id number] Age: 53 y.o.        Sex: male Referring Physician: Hal Hope Report Date:  12/11/2018        Interpreting Physician: Alexis Goodell  History: Douglas Fowler is an 53 y.o. male with a history of ETOH abuse presenting after an episode of unresponsiveness and seizure  Medications:  Folic Acid, Keppra, Ativan, Thiamine  Conditions of Recording:  This is a 21 channel routine scalp EEG performed with bipolar and monopolar montages arranged in accordance to the international 10/20 system of electrode placement. One channel was dedicated to EKG recording.  The patient is in the awake, drowsy and agitated state.  Description:  Artifact is prominent during the recording often obscuring the background rhythm. When able to be visualized the background has a low voltage beta activity superimposed on the background that is persistent.  A posterior background rhythm can be seen on short rare occasions when it appears to be an 11 Hz alpha activity, seen from the parieto-occipital and posterior temporal regions.   The patient drowses with slowing to irregular, low voltage theta and beta activity.   Stage II sleep is not obtained. The patient had multiple episodes of agitation during the recording.  Despite movement artifact no evidence of electrographic seizure activity was noted.   Hyperventilation and intermittent photic stimulation were not performed.  IMPRESSION: This is a normal awake and drowsy electroencephalogram with superimposed beta activity that is likely a medication effect.  No epileptiform activity is noted.     Alexis Goodell, MD Neurology [phone] 12/11/2018, 1:11 PM

## 2018-12-11 NOTE — H&P (Addendum)
History and Physical    Douglas Fowler:[id number] DOB: 1966/05/18 DOA: 12/10/2018  PCP: Imagene Riches, NP  Patient coming from: Patient was transferred from [facility].  Chief Complaint: Alcohol withdrawal delirium and seizures.  History obtained from patient's wife.  HPI: Douglas Fowler is a 53 y.o. male with history of alcohol abuse, admitted in September 2019 for acute CVA with history of hypertension was brought to the ER at [facility] after patient had a sudden unresponsive episode.  As per the patient's wife around 74 AM yesterday at home patient was lying in a recliner and within 5 minutes when the patient's wife came to recheck on the patient patient was on the floor unconscious.  EMS was called and was taken to the ER and on the way patient had a generalized tonic-clonic seizure.  Per patient's wife patient usually drinks 1/5 of whiskey every day but has not completed the usual amount he drinks a day before.  And patient also was stating that he is not feeling well.  But did not have any specific complaints like chest pain or shortness of breath.  ED Course: In the ER at [facility] patient had another generalized tonic-clonic seizure as per the report and was given Keppra 1 g loading dose and also was given Ativan for alcohol withdrawal.  Labs done over that showed leukocytosis of 11.8 hemoglobin 14.9 platelets 211 creatinine 1.4 lactic acid was 19 bicarb 8 potassium 4 glucose 112 CT head did not show anything acute but did show old multiple infarcts CT C-spine showed arthropathic changes no fracture.  AST ALT were 207 and 109.  Total bilirubin was normal at 1.1.  Patient's alcohol level was 0.03% which is high.  Since patient had initially complained of some rib pain CT chest without contrast was done which did not show any trauma but did show a lung nodule.  X-ray lumbar spine was unremarkable.  Patient's family requested transfer to [facility].  On my  evaluation patient just received 3 mg of IV Ativan for delirium and is presently sedated.  Patient is protecting airway and pupils are reacting to light.  Review of Systems: As per HPI, rest all negative.   Past Medical History:  Diagnosis Date  . Hypertension     History reviewed. No pertinent surgical history.   reports that he has never smoked. He has never used smokeless tobacco. No history on file for alcohol and drug.  No Known Allergies  Family History  Family history unknown: Yes    Prior to Admission medications   Medication Sig Start Date End Date Taking? Authorizing Provider  amLODipine (NORVASC) 10 MG tablet Take 1 tablet (10 mg total) by mouth daily. 08/08/18   Domenic Polite, MD  aspirin 325 MG tablet Take 1 tablet (325 mg total) by mouth daily. 08/08/18   Domenic Polite, MD  atorvastatin (LIPITOR) 80 MG tablet Take 1 tablet (80 mg total) by mouth daily at 6 PM. 08/07/18   Domenic Polite, MD  carvedilol (COREG) 3.125 MG tablet Take 1 tablet (3.125 mg total) by mouth 2 (two) times daily with a meal. 08/07/18   Domenic Polite, MD  nicotine (NICODERM CQ - DOSED IN MG/24 HOURS) 14 mg/24hr patch Place 1 patch (14 mg total) onto the skin daily. 08/08/18   Domenic Polite, MD    Physical Exam: Vitals:   12/11/18 0000 12/11/18 0030 12/11/18 0034 12/11/18 0100  BP: (!) 201/90 (!) 170/97 (!) 163/77 129/73  Pulse: 65 84 70 73  Resp: (!) 24 (!) 28 16 16   SpO2: 95% 98% 94% 95%      Constitutional: Moderately built and nourished. Vitals:   12/11/18 0000 12/11/18 0030 12/11/18 0034 12/11/18 0100  BP: (!) 201/90 (!) 170/97 (!) 163/77 129/73  Pulse: 65 84 70 73  Resp: (!) 24 (!) 28 16 16   SpO2: 95% 98% 94% 95%   Eyes: Anicteric no pallor. ENMT: No discharge from the ears eyes nose or mouth. Neck: No mass or.  No neck rigidity but no JVD appreciated. Respiratory: No rhonchi or crepitations. Cardiovascular: S1-S2 heard. Abdomen: Soft nontender bowel sounds  present. Musculoskeletal: No edema.  No joint effusion. Skin: No rash. Neurologic: Patient is sedated and lethargic does not follow commands. Psychiatric: Sedated.   Labs on Admission: I have personally reviewed following labs and imaging studies  CBC: Recent Labs  Lab 12/10/18 2351  WBC 6.7  NEUTROABS 4.8  HGB 13.9  HCT 40.7  MCV 92.7  PLT 169   Basic Metabolic Panel: Recent Labs  Lab 12/10/18 2351  NA 137  K 4.3  CL 102  CO2 24  GLUCOSE 94  BUN 10  CREATININE 1.27*  CALCIUM 8.0*  MG 1.7   GFR: CrCl cannot be calculated (Unknown ideal weight.). Liver Function Tests: Recent Labs  Lab 12/10/18 2351  AST 100*  ALT 76*  ALKPHOS 60  BILITOT 1.4*  PROT 6.5  ALBUMIN 3.5   No results for input(s): LIPASE, AMYLASE in the last 168 hours. Recent Labs  Lab 12/10/18 2351  AMMONIA 24   Coagulation Profile: No results for input(s): INR, PROTIME in the last 168 hours. Cardiac Enzymes: Recent Labs  Lab 12/10/18 2351  CKTOTAL 231  TROPONINI 0.14*   BNP (last 3 results) No results for input(s): PROBNP in the last 8760 hours. HbA1C: No results for input(s): HGBA1C in the last 72 hours. CBG: No results for input(s): GLUCAP in the last 168 hours. Lipid Profile: No results for input(s): CHOL, HDL, LDLCALC, TRIG, CHOLHDL, LDLDIRECT in the last 72 hours. Thyroid Function Tests: No results for input(s): TSH, T4TOTAL, FREET4, T3FREE, THYROIDAB in the last 72 hours. Anemia Panel: No results for input(s): VITAMINB12, FOLATE, FERRITIN, TIBC, IRON, RETICCTPCT in the last 72 hours. Urine analysis: No results found for: COLORURINE, APPEARANCEUR, LABSPEC, PHURINE, GLUCOSEU, HGBUR, BILIRUBINUR, KETONESUR, PROTEINUR, UROBILINOGEN, NITRITE, LEUKOCYTESUR Sepsis Labs: @LABRCNTIP (procalcitonin:4,lacticidven:4) )No results found for this or any previous visit (from the past 240 hour(s)).   Radiological Exams on Admission: No results found.  EKG: Independently reviewed.   Normal sinus rhythm.  Nonspecific ST changes.  Assessment/Plan Principal Problem:   Alcohol withdrawal delirium (HCC) Active Problems:   CVA (cerebral vascular accident) ([place])   Essential hypertension   CAD (coronary artery disease)   Alcohol withdrawal ([place])    1. Alcohol withdrawal with delirium -patient has been placed on CIWA protocol.  I have consulted pulmonary critical care in case patient needs to be on Precedex given the CIWA score.  On thiamine. 2. Alcohol withdrawal seizure was given Keppra.  Discussed with on-call neurologist Dr. Leonel Ramsay who advised patient will need long-term antiepileptic sensation seizures likely from patient not taking his usual amount of alcohol which could have been led to alcohol withdrawal seizures we will check EEG.  Seizure precautions.  PRN Ativan. 3. History of CVA has not been taking any of his medication since discharge last time in September as per the patient's wife. 4. History of hypertension we will closely monitor  blood pressure trends will keep patient on PRN IV hydralazine. 5. Elevated LFTs likely from alcoholism and also could be from possible hypotensive spell.  Recheck LFTs.  If it is showing increasing trend will need further work-up.  Otherwise further work-up as outpatient. 6. Lung nodule was seen in the CAT scan of the chest.  Will need follow-up as outpatient.  Patient had significant lactic acidosis of 19 at [facility] which likely from seizures.  Will recheck.   DVT prophylaxis: Lovenox. Code Status: Full code. Family Communication: Patient's wife. Disposition Plan: To be determined. Consults called: Pulmonary critical care. Admission status: Inpatient.   Rise Patience MD [facility] Pager [phone].  If 7PM-7AM, please contact night-coverage [email or web address] Password [facility]  12/11/2018, 2:47 AM

## 2018-12-11 NOTE — Progress Notes (Signed)
EEG complete - results pending 

## 2018-12-11 NOTE — Progress Notes (Signed)
Dr. Lonny Prude notified about patient becoming increasingly combative and agitated. Also informed her of his increase in blood pressure.  Restraint orders received and will continue to monitor.

## 2018-12-11 NOTE — Progress Notes (Signed)
PROGRESS NOTE  Douglas Fowler SEG:[id number] DOB: December 12, 1965 DOA: 12/10/2018 PCP: Imagene Riches, NP  HPI/Brief Narrative  Douglas Fowler is a 53 y.o. year old male with medical history significant for HTN, hyperlipidemia and chronic alcoholism who presented on 12/10/2018 with altered mental status after unwitnessed fall and was found to have severe alcohol withdrawal prompting transfer from [place] to [place] on 1/9.  Patient's wife provides history stating patient typically drinks 1/5 of hard liquor daily was in his usual state of health the day prior to admission in the morning time when she suddenly found him lying face down on the floor.  When she turned him over there is bleeding where he had bit his tongue.  He was sent to [facility] where he had a witnessed seizure per wife and x-ray holding.  He was then transferred to [facility] for further evaluation  Subjective Patient is requiring scheduled Ativan as well as PRN every 1 hour Currently has no complaints as he is sleepy  Assessment/Plan:  Seizure, suspect related to alcohol withdrawal Patient has multiple risk factors for seizures given recent stroke in September, chronic alcoholism and currently going through withdrawal.  EEG shows no focal activity, CT head with no acute abnormalities, CT spine unremarkable.  Has not had recurrent seizure here.  Discussed case with neurologist who recommended long-term AED for seizures related to alcohol withdrawal, will continue IV Keppra scheduled, continue neurochecks.  Alcohol withdrawal with delirium Concerning given elevated alcohol level and persistent symptoms of withdrawal prompted Critical care consultation who recommended scheduled Ativan in addition to as needed use with CIWA protocol. He is still protecting airway and remains hemodynamically stable.  If patient does not tolerate will need to consider potential Precedex in ICU level of care.   History of CVA, has residual weakness per wife  and has not been adherent with his medication regimen.    Hypertension, previously normotensive but now becoming hypertensive with systolics in the 737T likely related to withdrawal.  Use PRN IV hydralazine  Elevated troponin, resolved Peak of 0.2 has now down trended.  Likely demand ischemia in the setting of alcohol withdrawal.  EKG nonischemic.  Cannot assess for chest pain will continue to monitor  Transaminitis, likely alcoholic hepatitis Continue to monitor CMP closely lactic acidosis, resolved Reported lactic acid of 19 at [facility], here lactic acid has remained normal.  Likely elevated in the setting of seizure-like activity.      DVT prophylaxis: Consultants:  Critical Care    Procedures:  EEG 12/11/18   Code Status: FULL    Family Communication: Updated family at bedside   Disposition Plan: Needs continue close monitoring giving active alcohol withdrawal and very high risk for needing higher level of care in ICU if does not remain sedated and controlled on current regimen (IV Ativan scheduled and as needed with CIWA protocol) as well as IV Keppra        Objective: Vitals:   12/11/18 0852 12/11/18 0922 12/11/18 0953 12/11/18 1122  BP: (!) 164/65 (!) 160/73 131/67 (!) 126/95  Pulse: 75 81 79 79  Resp: 18 (!) 21 19 17   Temp:      TempSrc:      SpO2: 96% 96% 95% 99%    Intake/Output Summary (Last 24 hours) at 12/11/2018 1827 Last data filed at 12/11/2018 1820 Gross per 24 hour  Intake 1178.09 ml  Output 600 ml  Net 578.09 ml   There were no vitals filed for this  visit.  Exam:  Constitutional:normal appearing male somnolent, sedated Cardiovascular: RRR no MRGs, with no peripheral edema Respiratory: Normal respiratory effort on room air, clear breath sounds  Skin: No rash ulcers, or lesions. Without skin tenting  Neurologic: Unable to assess sedated   Data Reviewed: CBC: Recent Labs  Lab 12/10/18 2351 12/11/18 0707  WBC 6.7 5.6    NEUTROABS 4.8  --   HGB 13.9 12.8*  HCT 40.7 38.2*  MCV 92.7 93.4  PLT 150 301*   Basic Metabolic Panel: Recent Labs  Lab 12/10/18 2351 12/11/18 0707  NA 137 137  K 4.3 4.0  CL 102 103  CO2 24 22  GLUCOSE 94 86  BUN 10 9  CREATININE 1.27* 1.24  CALCIUM 8.0* 7.6*  MG 1.7 2.2  PHOS  --  2.6   GFR: CrCl cannot be calculated (Unknown ideal weight.). Liver Function Tests: Recent Labs  Lab 12/10/18 2351  AST 100*  ALT 76*  ALKPHOS 60  BILITOT 1.4*  PROT 6.5  ALBUMIN 3.5   No results for input(s): LIPASE, AMYLASE in the last 168 hours. Recent Labs  Lab 12/10/18 2351  AMMONIA 24   Coagulation Profile: No results for input(s): INR, PROTIME in the last 168 hours. Cardiac Enzymes: Recent Labs  Lab 12/10/18 2351 12/11/18 0707 12/11/18 1319  CKTOTAL 231  --   --   TROPONINI 0.14* 0.20* 0.19*   BNP (last 3 results) No results for input(s): PROBNP in the last 8760 hours. HbA1C: No results for input(s): HGBA1C in the last 72 hours. CBG: Recent Labs  Lab 12/11/18 0633 12/11/18 1646  GLUCAP 91 76   Lipid Profile: No results for input(s): CHOL, HDL, LDLCALC, TRIG, CHOLHDL, LDLDIRECT in the last 72 hours. Thyroid Function Tests: No results for input(s): TSH, T4TOTAL, FREET4, T3FREE, THYROIDAB in the last 72 hours. Anemia Panel: No results for input(s): VITAMINB12, FOLATE, FERRITIN, TIBC, IRON, RETICCTPCT in the last 72 hours. Urine analysis: No results found for: COLORURINE, APPEARANCEUR, LABSPEC, PHURINE, GLUCOSEU, HGBUR, BILIRUBINUR, KETONESUR, PROTEINUR, UROBILINOGEN, NITRITE, LEUKOCYTESUR Sepsis Labs: @LABRCNTIP (procalcitonin:4,lacticidven:4)  ) Recent Results (from the past 240 hour(s))  MRSA PCR Screening     Status: None   Collection Time: 12/11/18  6:48 AM  Result Value Ref Range Status   MRSA by PCR NEGATIVE NEGATIVE Final    Comment:        The GeneXpert MRSA Assay (FDA approved for NASAL specimens only), is one component of a comprehensive  MRSA colonization surveillance program. It is not intended to diagnose MRSA infection nor to guide or monitor treatment for MRSA infections. Performed at [facility], [place] [address], [place], [place] [address]       Studies: No results found.  Scheduled Meds: . chlorhexidine  15 mL Mouth Rinse BID  . folic acid  1 mg Intravenous Daily  . heparin injection (subcutaneous)  5,000 Units Subcutaneous Q8H  . Influenza vac split quadrivalent PF  0.5 mL Intramuscular Tomorrow-1000  . LORazepam  2 mg Intravenous Q2H  . mouth rinse  15 mL Mouth Rinse q12n4p  . pneumococcal 23 valent vaccine  0.5 mL Intramuscular Tomorrow-1000  . thiamine injection  100 mg Intravenous Daily    Continuous Infusions: . sodium chloride 75 mL/hr at 12/11/18 1502  . levETIRAcetam 500 mg (12/11/18 3235)     LOS: 1 day     Desiree Hane, MD [facility] Pager [phone]  If 7PM-7AM, please contact night-coverage [email or web address] Password [facility] 12/11/2018, 6:27 PM

## 2018-12-12 LAB — GLUCOSE, CAPILLARY
Glucose-Capillary: 77 mg/dL (ref 70–99)
Glucose-Capillary: 78 mg/dL (ref 70–99)

## 2018-12-12 NOTE — Progress Notes (Signed)
Patient continue to be on 4 point soft restraint as per order, alert, trying to get off the restraints, family at bedside, monitoring of the restraints continue,  No skin issues noted after each assessment, will continue too monitor, continue schedule ativan as per order.

## 2018-12-12 NOTE — Progress Notes (Signed)
PROGRESS NOTE  Douglas Fowler RJJ:[id number] DOB: Feb 12, 1966 DOA: 12/10/2018 PCP: Imagene Riches, NP  HPI/Brief Narrative  Douglas Fowler is a 53 y.o. year old male with medical history significant for HTN, hyperlipidemia and chronic alcoholism who presented on 12/10/2018 with altered mental status after unwitnessed fall and was found to have severe alcohol withdrawal prompting transfer from [place] to [place] on 1/9.  Patient's wife provides history stating patient typically drinks 1/5 of hard liquor daily was in his usual state of health the day prior to admission in the morning time when she suddenly found him lying face down on the floor.  When she turned him over there is bleeding where he had bit his tongue.  He was sent to [facility] where he had a witnessed seizure per wife and x-ray holding.  He was then transferred to [facility] for further evaluation  Subjective Denies any pain or complaints currently Wife reports he slept ok   Assessment/Plan:  Seizure, suspect related to alcohol withdrawal Patient has multiple risk factors for seizures given recent stroke in September, chronic alcoholism and currently going through withdrawal.  EEG shows no focal activity, CT head with no acute abnormalities, CT spine unremarkable.  Has not had recurrent seizure here.  Discussed case with neurologist who recommended long-term AED for seizures related to alcohol withdrawal, will continue IV Keppra, folic and thiamin until able to tolerate PO, continue neurochecks.  Alcohol withdrawal with delirium, slightly improving In last 24 hours needed 24 mg total of ativan, noticing improving as he has missed some of the PRN dosing per CIWA protocol. He is high risk given elevated alcohol level and persistent symptoms of withdrawal. Critical care recommended scheduled Ativan in addition to as needed use with CIWA protocol. He is still protecting airway and remains hemodynamically stable.    History of CVA, has  residual weakness per wife and has not been adherent with his medication regimen.  Neurochecks  Hypertension, previously normotensive but now becoming hypertensive with systolics in the 016W.Likely related to withdrawal.  Use PRN IV hydralazine  Elevated troponin, resolved Peak of 0.2 has now down trended.  Likely demand ischemia in the setting of alcohol withdrawal.  EKG nonischemic.  Cannot assess for chest pain will continue to monitor  Transaminitis, likely alcoholic hepatitis Continue to monitor CMP closely lactic acidosis, resolved Reported lactic acid of 19 at [facility], here lactic acid has remained normal.  Likely elevated in the setting of seizure-like activity.    DVT prophylaxis: Consultants:  Critical Care    Procedures:  EEG 12/11/18   Code Status: FULL    Family Communication: Updated family at bedside   Disposition Plan: Needs continue close monitoring giving active alcohol withdrawal and very high risk for needing higher level of care in ICU if does not remain sedated and controlled on current regimen (IV Ativan scheduled and as needed with CIWA protocol) as well as IV Keppra        Objective: Vitals:   12/12/18 0400 12/12/18 0500 12/12/18 0819 12/12/18 1222  BP: (!) 162/88  (!) 177/94 (!) 195/89  Pulse:   79 67  Resp:   17 16  Temp:   98 F (36.7 C) 98.4 F (36.9 C)  TempSrc:   Oral Axillary  SpO2:   100% 96%  Weight:  79 kg    Height:        Intake/Output Summary (Last 24 hours) at 12/12/2018 1333 Last data filed at 12/12/2018 1093 Gross  per 24 hour  Intake 756.53 ml  Output 425 ml  Net 331.53 ml   Filed Weights   12/12/18 0000 12/12/18 0500  Weight: 79.3 kg 79 kg    Exam:  Constitutional:normal appearing male somnolent but arousable to voice, no distress ENMT: EOMI, anicteric Cardiovascular: RRR no MRGs, with no peripheral edema Respiratory: Normal respiratory effort on room air, clear breath sounds  Abdomen: soft,  non-tender, normal bowel sounds Skin: No rash ulcers, or lesions. Without skin tenting  Neurologic: sleepy but arousable to voice. No appreciable focal deficits on my exam. Slurred speech but no facial droop. Able to state wife's name. Moving all extremities in the bed on his own.    Data Reviewed: CBC: Recent Labs  Lab 12/10/18 2351 12/11/18 0707  WBC 6.7 5.6  NEUTROABS 4.8  --   HGB 13.9 12.8*  HCT 40.7 38.2*  MCV 92.7 93.4  PLT 150 762*   Basic Metabolic Panel: Recent Labs  Lab 12/10/18 2351 12/11/18 0707  NA 137 137  K 4.3 4.0  CL 102 103  CO2 24 22  GLUCOSE 94 86  BUN 10 9  CREATININE 1.27* 1.24  CALCIUM 8.0* 7.6*  MG 1.7 2.2  PHOS  --  2.6   GFR: Estimated Creatinine Clearance: 67.3 mL/min (by C-G formula based on SCr of 1.24 mg/dL). Liver Function Tests: Recent Labs  Lab 12/10/18 2351  AST 100*  ALT 76*  ALKPHOS 60  BILITOT 1.4*  PROT 6.5  ALBUMIN 3.5   No results for input(s): LIPASE, AMYLASE in the last 168 hours. Recent Labs  Lab 12/10/18 2351  AMMONIA 24   Coagulation Profile: No results for input(s): INR, PROTIME in the last 168 hours. Cardiac Enzymes: Recent Labs  Lab 12/10/18 2351 12/11/18 0707 12/11/18 1319  CKTOTAL 231  --   --   TROPONINI 0.14* 0.20* 0.19*   BNP (last 3 results) No results for input(s): PROBNP in the last 8760 hours. HbA1C: No results for input(s): HGBA1C in the last 72 hours. CBG: Recent Labs  Lab 12/11/18 0633 12/11/18 1646 12/11/18 2307 12/12/18 0604 12/12/18 1127  GLUCAP 91 76 77 77 78   Lipid Profile: No results for input(s): CHOL, HDL, LDLCALC, TRIG, CHOLHDL, LDLDIRECT in the last 72 hours. Thyroid Function Tests: No results for input(s): TSH, T4TOTAL, FREET4, T3FREE, THYROIDAB in the last 72 hours. Anemia Panel: No results for input(s): VITAMINB12, FOLATE, FERRITIN, TIBC, IRON, RETICCTPCT in the last 72 hours. Urine analysis: No results found for: COLORURINE, APPEARANCEUR, LABSPEC, PHURINE,  GLUCOSEU, HGBUR, BILIRUBINUR, KETONESUR, PROTEINUR, UROBILINOGEN, NITRITE, LEUKOCYTESUR Sepsis Labs: @LABRCNTIP (procalcitonin:4,lacticidven:4)  ) Recent Results (from the past 240 hour(s))  MRSA PCR Screening     Status: None   Collection Time: 12/11/18  6:48 AM  Result Value Ref Range Status   MRSA by PCR NEGATIVE NEGATIVE Final    Comment:        The GeneXpert MRSA Assay (FDA approved for NASAL specimens only), is one component of a comprehensive MRSA colonization surveillance program. It is not intended to diagnose MRSA infection nor to guide or monitor treatment for MRSA infections. Performed at [facility], [place] [address], [place], [place] [address]       Studies: No results found.  Scheduled Meds: . chlorhexidine  15 mL Mouth Rinse BID  . folic acid  1 mg Intravenous Daily  . heparin injection (subcutaneous)  5,000 Units Subcutaneous Q8H  . Influenza vac split quadrivalent PF  0.5 mL Intramuscular Tomorrow-1000  .  LORazepam  2 mg Intravenous Q2H  . mouth rinse  15 mL Mouth Rinse q12n4p  . pneumococcal 23 valent vaccine  0.5 mL Intramuscular Tomorrow-1000  . thiamine injection  100 mg Intravenous Daily    Continuous Infusions: . levETIRAcetam 500 mg (12/12/18 0914)     LOS: 2 days     Desiree Hane, MD [facility] Pager [phone]  If 7PM-7AM, please contact night-coverage [email or web address] Password [email or web address] 12/12/2018, 1:33 PM

## 2018-12-13 LAB — GLUCOSE, CAPILLARY
Glucose-Capillary: 78 mg/dL (ref 70–99)
Glucose-Capillary: 76 mg/dL (ref 70–99)
Glucose-Capillary: 77 mg/dL (ref 70–99)
Glucose-Capillary: 81 mg/dL (ref 70–99)
Glucose-Capillary: 74 mg/dL (ref 70–99)

## 2018-12-13 LAB — COMPREHENSIVE METABOLIC PANEL
ALBUMIN: 3 g/dL — AB (ref 3.5–5.0)
ALT: 39 U/L (ref 0–44)
AST: 39 U/L (ref 15–41)
Alkaline Phosphatase: 66 U/L (ref 38–126)
Anion gap: 15 (ref 5–15)
BUN: 9 mg/dL (ref 6–20)
CO2: 19 mmol/L — AB (ref 22–32)
Calcium: 8.1 mg/dL — ABNORMAL LOW (ref 8.9–10.3)
Chloride: 103 mmol/L (ref 98–111)
Creatinine, Ser: 1.11 mg/dL (ref 0.61–1.24)
GFR calc Af Amer: >60 mL/min (ref >60)
GFR calc non Af Amer: >60 mL/min (ref >60)
GLUCOSE: 74 mg/dL (ref 70–99)
Potassium: 3.8 mmol/L (ref 3.5–5.1)
SODIUM: 137 mmol/L (ref 135–145)
Total Bilirubin: 1.3 mg/dL — ABNORMAL HIGH (ref 0.3–1.2)
Total Protein: 5.6 g/dL — ABNORMAL LOW (ref 6.5–8.1)

## 2018-12-13 LAB — PHOSPHORUS: Phosphorus: 2.5 mg/dL (ref 2.5–4.6)

## 2018-12-13 LAB — MAGNESIUM: Magnesium: 1.7 mg/dL (ref 1.7–2.4)

## 2018-12-13 MED ORDER — HEPARIN SODIUM (PORCINE) 5000 UNIT/ML IJ SOLN
5000.0000 [IU] | Freq: Three times a day (TID) | INTRAMUSCULAR | Status: DC
  Administered 2018-12-14 – 2018-12-18 (×13): 5000 [IU] via SUBCUTANEOUS
  Filled 2018-12-13 (×14): qty 1

## 2018-12-13 MED ORDER — MAGNESIUM SULFATE 2 GM/50ML IV SOLN
2.0000 g | Freq: Once | INTRAVENOUS | Status: AC
  Administered 2018-12-13: 2 g via INTRAVENOUS
  Filled 2018-12-13: qty 50

## 2018-12-13 NOTE — Progress Notes (Addendum)
PROGRESS NOTE  Douglas Fowler JOI:[id number] DOB: 07-13-1966 DOA: 12/10/2018 PCP: Imagene Riches, NP  HPI/Brief Narrative  Douglas Fowler is a 53 y.o. year old male with medical history significant for HTN, hyperlipidemia and chronic alcoholism who presented on 12/10/2018 with altered mental status after unwitnessed fall and was found to have severe alcohol withdrawal prompting transfer from [place] to [place] on 1/9.  Patient's wife provides history stating patient typically drinks 1/5 of hard liquor daily was in his usual state of health the day prior to admission in the morning time when she suddenly found him lying face down on the floor.  When she turned him over there is bleeding where he had bit his tongue.  He was sent to [facility] where he had a witnessed seizure per wife and x-ray holding.  He was then transferred to [facility] for further evaluation  Subjective No acute events overnight   Assessment/Plan:  Seizure, suspect related to alcohol withdrawal, stable Patient has multiple risk factors for seizures given recent stroke in September, chronic alcoholism and currently going through withdrawal.  EEG shows no focal activity, CT head with no acute abnormalities, CT spine unremarkable.  Has not had recurrent seizure here.  Discussed case with neurologist who recommended long-term AED for seizures related to alcohol withdrawal, will continue IV Keppra, folic and thiamin until able to tolerate PO, continue neurochecks.  Alcohol withdrawal with delirium, continues to improve Has only required 10 mg of Ativan (scheduled, missed several doses as patient was sleeping and not requiring) and only 6 mg daily as needed.  Will discontinue scheduled Ativan and continue to watch on CIWA protocol (last score 12).  Will assess for bed swallow screen and if so will add diet, de-escalate to potential oral options,?  Librium.  Continue IV folate and thiamine for now    History of CVA, has residual  weakness on left side Per wife has not been adherent with his medication regimen.  Neurochecks  Hypertension, elevated BP with SBP ranging 160s to 190s Likely related to increase excitatory mechanisms from acute alcohol withdrawal.  PRN IV hydralazine given most recent blood pressure in 160s and no reason for continuous infusion, if passes bedside swallow will initiate home BP medications.  Hypomagnesemia, related to poor nutritional status from alcohol substance abuse Mag of 1.7, will replete.  Continue to closely monitor potassium, Phos, mag.  Elevated troponin, resolved Peak of 0.2 has now down trended.  Likely demand ischemia in the setting of alcohol withdrawal.  EKG nonischemic.  Cannot assess for chest pain will continue to monitor  Transaminitis, likely alcoholic hepatitis, resolved Continue to monitor CMP   Lactic acidosis, resolved Reported lactic acid of 19 at [facility], here lactic acid has remained normal.  Likely elevated in the setting of seizure-like activity.    DVT prophylaxis: Consultants:  Critical Care    Procedures:  EEG 12/11/18   Code Status: FULL    Family Communication: Updated wife at bedside   Disposition Plan: Needs continue close monitoring giving active alcohol withdrawal and very high risk for needing higher level of care in ICU if does not remain sedated and controlled on current regimen (IV Ativan as needed with CIWA protocol) as well as IV Keppra        Objective: Vitals:   12/13/18 0002 12/13/18 0439 12/13/18 0751 12/13/18 1247  BP: (!) 197/97  (!) 169/87 (!) 120/99  Pulse: 70  79 (!) 115  Resp: 19  16 20  Temp: 98.5 F (36.9 C)  (!) 97.4 F (36.3 C) (!) 97.5 F (36.4 C)  TempSrc: Axillary  Oral Oral  SpO2: 98%  97% 100%  Weight: 79.5 kg 79.6 kg    Height:        Intake/Output Summary (Last 24 hours) at 12/13/2018 1408 Last data filed at 12/12/2018 1816 Gross per 24 hour  Intake -  Output 425 ml  Net -425 ml    Filed Weights   12/12/18 0500 12/13/18 0002 12/13/18 0439  Weight: 79 kg 79.5 kg 79.6 kg    Exam:  Constitutional:normal appearing male somnolent but arousable to voice, no distress ENMT: EOMI, anicteric Cardiovascular: RRR no MRGs, with no peripheral edema Respiratory: Normal respiratory effort on room air, clear breath sounds  Abdomen: soft, non-tender, normal bowel sounds Skin: No rash ulcers, or lesions. Without skin tenting  Neurologic: sleepy but arousable to voice.  Diminished strength in left upper arm otherwise no appreciable focal deficits on my exam. Slurred speech but no facial droop. Able to state wife's name.  Oriented to self, place.  Moving all extremities in the bed on his own.    Data Reviewed: CBC: Recent Labs  Lab 12/10/18 2351 12/11/18 0707  WBC 6.7 5.6  NEUTROABS 4.8  --   HGB 13.9 12.8*  HCT 40.7 38.2*  MCV 92.7 93.4  PLT 150 010*   Basic Metabolic Panel: Recent Labs  Lab 12/10/18 2351 12/11/18 0707 12/13/18 0608  NA 137 137 137  K 4.3 4.0 3.8  CL 102 103 103  CO2 24 22 19*  GLUCOSE 94 86 74  BUN 10 9 9   CREATININE 1.27* 1.24 1.11  CALCIUM 8.0* 7.6* 8.1*  MG 1.7 2.2 1.7  PHOS  --  2.6 2.5   GFR: Estimated Creatinine Clearance: 75.4 mL/min (by C-G formula based on SCr of 1.11 mg/dL). Liver Function Tests: Recent Labs  Lab 12/10/18 2351 12/13/18 0608  AST 100* 39  ALT 76* 39  ALKPHOS 60 66  BILITOT 1.4* 1.3*  PROT 6.5 5.6*  ALBUMIN 3.5 3.0*   No results for input(s): LIPASE, AMYLASE in the last 168 hours. Recent Labs  Lab 12/10/18 2351  AMMONIA 24   Coagulation Profile: No results for input(s): INR, PROTIME in the last 168 hours. Cardiac Enzymes: Recent Labs  Lab 12/10/18 2351 12/11/18 0707 12/11/18 1319  CKTOTAL 231  --   --   TROPONINI 0.14* 0.20* 0.19*   BNP (last 3 results) No results for input(s): PROBNP in the last 8760 hours. HbA1C: No results for input(s): HGBA1C in the last 72 hours. CBG: Recent Labs   Lab 12/12/18 1127 12/12/18 1930 12/13/18 0022 12/13/18 0606 12/13/18 1146  GLUCAP 78 76 77 78 81   Lipid Profile: No results for input(s): CHOL, HDL, LDLCALC, TRIG, CHOLHDL, LDLDIRECT in the last 72 hours. Thyroid Function Tests: No results for input(s): TSH, T4TOTAL, FREET4, T3FREE, THYROIDAB in the last 72 hours. Anemia Panel: No results for input(s): VITAMINB12, FOLATE, FERRITIN, TIBC, IRON, RETICCTPCT in the last 72 hours. Urine analysis: No results found for: COLORURINE, APPEARANCEUR, LABSPEC, PHURINE, GLUCOSEU, HGBUR, BILIRUBINUR, KETONESUR, PROTEINUR, UROBILINOGEN, NITRITE, LEUKOCYTESUR Sepsis Labs: @LABRCNTIP (procalcitonin:4,lacticidven:4)  ) Recent Results (from the past 240 hour(s))  MRSA PCR Screening     Status: None   Collection Time: 12/11/18  6:48 AM  Result Value Ref Range Status   MRSA by PCR NEGATIVE NEGATIVE Final    Comment:        The GeneXpert MRSA Assay (FDA approved for  NASAL specimens only), is one component of a comprehensive MRSA colonization surveillance program. It is not intended to diagnose MRSA infection nor to guide or monitor treatment for MRSA infections. Performed at [facility], [place] [address] [place], [place] [address]       Studies: No results found.  Scheduled Meds: . chlorhexidine  15 mL Mouth Rinse BID  . folic acid  1 mg Intravenous Daily  . heparin injection (subcutaneous)  5,000 Units Subcutaneous Q8H  . Influenza vac split quadrivalent PF  0.5 mL Intramuscular Tomorrow-1000  . LORazepam  2 mg Intravenous Q2H  . mouth rinse  15 mL Mouth Rinse q12n4p  . pneumococcal 23 valent vaccine  0.5 mL Intramuscular Tomorrow-1000  . thiamine injection  100 mg Intravenous Daily    Continuous Infusions: . levETIRAcetam 500 mg (12/13/18 1007)  . magnesium sulfate 1 - 4 g bolus IVPB 2 g (12/13/18 1315)     LOS: 3 days     Desiree Hane, MD [facility] Pager [phone]  If 7PM-7AM, please contact  night-coverage [email or web address] Password [facility] 12/13/2018, 2:08 PM

## 2018-12-14 LAB — COMPREHENSIVE METABOLIC PANEL
ALT: 38 U/L (ref 0–44)
AST: 41 U/L (ref 15–41)
Albumin: 3 g/dL — ABNORMAL LOW (ref 3.5–5.0)
Alkaline Phosphatase: 66 U/L (ref 38–126)
Anion gap: 12 (ref 5–15)
BUN: 8 mg/dL (ref 6–20)
CO2: 20 mmol/L — AB (ref 22–32)
Calcium: 8.4 mg/dL — ABNORMAL LOW (ref 8.9–10.3)
Chloride: 104 mmol/L (ref 98–111)
Creatinine, Ser: 1.07 mg/dL (ref 0.61–1.24)
GFR calc Af Amer: >60 mL/min (ref >60)
GFR calc non Af Amer: >60 mL/min (ref >60)
GLUCOSE: 85 mg/dL (ref 70–99)
Potassium: 3.5 mmol/L (ref 3.5–5.1)
SODIUM: 136 mmol/L (ref 135–145)
Total Bilirubin: 1.4 mg/dL — ABNORMAL HIGH (ref 0.3–1.2)
Total Protein: 5.8 g/dL — ABNORMAL LOW (ref 6.5–8.1)

## 2018-12-14 LAB — GLUCOSE, CAPILLARY
Glucose-Capillary: 156 mg/dL — ABNORMAL HIGH (ref 70–99)
Glucose-Capillary: 71 mg/dL (ref 70–99)
Glucose-Capillary: 83 mg/dL (ref 70–99)

## 2018-12-14 MED ORDER — VITAMIN B-1 100 MG PO TABS
100.0000 mg | ORAL_TABLET | Freq: Every day | ORAL | Status: DC
  Administered 2018-12-15 – 2018-12-18 (×4): 100 mg via ORAL
  Filled 2018-12-14 (×4): qty 1

## 2018-12-14 MED ORDER — AMLODIPINE BESYLATE 10 MG PO TABS
10.0000 mg | ORAL_TABLET | Freq: Every day | ORAL | Status: DC
  Administered 2018-12-14 – 2018-12-18 (×5): 10 mg via ORAL
  Filled 2018-12-14 (×5): qty 1

## 2018-12-14 MED ORDER — PROSIGHT PO TABS
1.0000 | ORAL_TABLET | Freq: Every day | ORAL | Status: DC
  Administered 2018-12-15 – 2018-12-18 (×4): 1 via ORAL
  Filled 2018-12-14 (×4): qty 1

## 2018-12-14 MED ORDER — FOLIC ACID 1 MG PO TABS
1.0000 mg | ORAL_TABLET | Freq: Every day | ORAL | Status: DC
  Administered 2018-12-15 – 2018-12-18 (×4): 1 mg via ORAL
  Filled 2018-12-14 (×4): qty 1

## 2018-12-14 MED ORDER — CARVEDILOL 3.125 MG PO TABS
3.1250 mg | ORAL_TABLET | Freq: Two times a day (BID) | ORAL | Status: DC
  Administered 2018-12-14: 3.125 mg via ORAL
  Filled 2018-12-14: qty 1

## 2018-12-14 MED ORDER — LEVETIRACETAM 500 MG PO TABS
500.0000 mg | ORAL_TABLET | Freq: Two times a day (BID) | ORAL | Status: DC
  Administered 2018-12-14 – 2018-12-18 (×8): 500 mg via ORAL
  Filled 2018-12-14 (×8): qty 1

## 2018-12-14 NOTE — Evaluation (Signed)
Physical Therapy Evaluation Patient Details Name: Douglas Fowler MRN: [id number] DOB: [birth year] Today's Date: 12/14/2018   History of Present Illness  Pt is a 53 y/o male transferred from [place] following AMS, fall and seizure. Thought to be secondary to alcohol withdrawal and delirium. CT of head and spine and EEG negative for acute abnormalities. Troponins slightly elevation, however, per notes, likely secondary to demand ischemia. PMH includes CVA with L sided deficits, HTN, CAD and alcoholism.   Clinical Impression  Pt admitted secondary to problem above with deficits below. Pt lethargic and slurring his speech throughout session. Required multimodal cues to perform mobility tasks. Min A for bed mobility, however, upon sitting, pt with increased dizziness and requesting to lie back down. BP at 137/93 mmHg. Feel pt is currently at increased risk for falls and will not be able to perform necessary mobility tasks. If lethargy and mobility improves, may be able to progress to home; will update as pt progresses. Will continue to follow acutely to maximize functional mobility independence and safety.     Follow Up Recommendations SNF;Supervision/Assistance - 24 hour    Equipment Recommendations  Other (comment)(TBD pending progress )    Recommendations for Other Services OT consult     Precautions / Restrictions Precautions Precautions: Fall;Other (comment) Precaution Comments: seziure Restrictions Weight Bearing Restrictions: No      Mobility  Bed Mobility Overal bed mobility: Needs Assistance Bed Mobility: Supine to Sit;Sit to Supine     Supine to sit: Min assist Sit to supine: Supervision   General bed mobility comments: Min A for trunk elevation and balance. Multimodal cues required for sequencing to come to EOB. Upon sitting at EOB, pt reports increased dizziness and requested to lie back down. BP at 137/93 mmHg.   Transfers                 General  transfer comment: Unable   Ambulation/Gait                Stairs            Wheelchair Mobility    Modified Rankin (Stroke Patients Only)       Balance Overall balance assessment: Needs assistance Sitting-balance support: Bilateral upper extremity supported Sitting balance-Leahy Scale: Poor Sitting balance - Comments: At least min A required for sitting balance.                                      Pertinent Vitals/Pain Pain Assessment: No/denies pain    Home Living Family/patient expects to be discharged to:: Private residence Living Arrangements: Spouse/significant other Available Help at Discharge: Family;Available PRN/intermittently Type of Home: House Home Access: Stairs to enter Entrance Stairs-Rails: Right Entrance Stairs-Number of Steps: 2-3 Home Layout: One level Home Equipment: Shower seat      Prior Function Level of Independence: Independent               Hand Dominance        Extremity/Trunk Assessment   Upper Extremity Assessment Upper Extremity Assessment: Defer to OT evaluation    Lower Extremity Assessment Lower Extremity Assessment: Generalized weakness;RLE deficits/detail;LLE deficits/detail RLE Deficits / Details: Able to perform heel slides and ankle pumps. Unable to perform formal assessment secondary to impaired cognition.  LLE Deficits / Details: Able to perform heel slides and ankle pumps. Unable to perform formal assessment secondary to impaired cognition. Per notes, L sided  deficits at baseline secondary to CVA.     Cervical / Trunk Assessment Cervical / Trunk Assessment: Kyphotic  Communication   Communication: Expressive difficulties(slurred speech )  Cognition Arousal/Alertness: Lethargic Behavior During Therapy: Flat affect Overall Cognitive Status: No family/caregiver present to determine baseline cognitive functioning                                 General Comments: Pt slow  to respond and lethargic during session. Also required cues for sequencing throughout.       General Comments General comments (skin integrity, edema, etc.): No family present during session.     Exercises     Assessment/Plan    PT Assessment Patient needs continued PT services  PT Problem List Decreased strength;Decreased activity tolerance;Decreased balance;Decreased mobility;Decreased cognition;Decreased knowledge of use of DME;Decreased knowledge of precautions;Decreased safety awareness       PT Treatment Interventions DME instruction;Gait training;Functional mobility training;Therapeutic activities;Therapeutic exercise;Stair training;Balance training;Cognitive remediation;Patient/family education    PT Goals (Current goals can be found in the Care Plan section)  Acute Rehab PT Goals Patient Stated Goal: to eat his lunch  PT Goal Formulation: With patient Time For Goal Achievement: 12/28/18 Potential to Achieve Goals: Fair    Frequency Min 2X/week   Barriers to discharge        Co-evaluation               AM-PAC PT "6 Clicks" Mobility  Outcome Measure Help needed turning from your back to your side while in a flat bed without using bedrails?: A Little Help needed moving from lying on your back to sitting on the side of a flat bed without using bedrails?: A Little Help needed moving to and from a bed to a chair (including a wheelchair)?: A Lot Help needed standing up from a chair using your arms (e.g., wheelchair or bedside chair)?: A Lot Help needed to walk in hospital room?: A Lot Help needed climbing 3-5 steps with a railing? : Total 6 Click Score: 13    End of Session   Activity Tolerance: Patient limited by lethargy;Treatment limited secondary to medical complications (Comment)(dizziness ) Patient left: in bed;with call bell/phone within reach;with bed alarm set Nurse Communication: Mobility status;Other (comment)(dizziness ) PT Visit Diagnosis: Muscle  weakness (generalized) (M62.81);History of falling (Z91.81);Other symptoms and signs involving the nervous system (R29.898);Other abnormalities of gait and mobility (R26.89)    Time: 7342-8768 PT Time Calculation (min) (ACUTE ONLY): 13 min   Charges:   PT Evaluation $PT Eval Moderate Complexity: [place], PT, DPT  Acute Rehabilitation Services  Pager: [phone] Office: [phone]   Rudean Hitt 12/14/2018, 12:55 PM

## 2018-12-14 NOTE — Progress Notes (Signed)
PROGRESS NOTE  RUFORD DUDZINSKI XIP:[id number] DOB: 10-Apr-1966 DOA: 12/10/2018 PCP: Imagene Riches, NP  HPI/Brief Narrative  Douglas Fowler is a 53 y.o. year old male with medical history significant for HTN, hyperlipidemia and chronic alcoholism who presented on 12/10/2018 with altered mental status after unwitnessed fall and was found to have severe alcohol withdrawal prompting transfer from [place] to [place] on 1/9.  Patient's wife provides history stating patient typically drinks 1/5 of hard liquor daily was in his usual state of health the day prior to admission in the morning time when she suddenly found him lying face down on the floor.  When she turned him over there is bleeding where he had bit his tongue.  He was sent to [facility] where he had a witnessed seizure per wife and x-ray holding.  He was then transferred to [facility] for [facility] evaluation  Subjective No acute complaints this morning.  Denies any chest pain, shortness of breath, changes in weakness   Assessment/Plan:  Seizure, suspect related to alcohol withdrawal, stable Patient has multiple risk factors for seizures given recent stroke in September, chronic alcoholism and currently going through withdrawal.  EEG shows no focal activity, CT head with no acute abnormalities, CT spine unremarkable.  Has not had recurrent seizure here.  Discussed case with neurologist who recommended long-term AED for seizures related to alcohol withdrawal, will continue IV Keppra, folic and thiamin until able to tolerate PO, continue neurochecks.  Alcohol withdrawal with delirium, continues to improve No longer requiring scheduled Ativan, continue to monitor CIWA protocol believe we have entered the last days of his withdrawal symptoms given his mental status is closer to baseline.  He has passed swallow screen tolerating diet.  Will transition IV folate, thiamine to oral and start multivitamin supplementation.    History of CVA, has residual  weakness on left side Per wife has not been adherent with his medication regimen.  Neurochecks  Hypertension, improved control Tolerating reinitiation of home oral BP medications: Amlodipine and Coreg, continue to monitor.  IV hydralazine PRN  Hypomagnesemia, related to poor nutritional status from alcohol substance abuse   Continue to closely monitor potassium, Phos, mag and replete as necessary.  Elevated troponin, resolved Peak of 0.2 has now down trended.  Likely demand ischemia in the setting of alcohol withdrawal.  EKG nonischemic.  Cannot assess for chest pain will continue to monitor  Transaminitis, likely alcoholic hepatitis, resolved Continue to monitor CMP   Lactic acidosis, resolved Reported lactic acid of 19 at [facility], here lactic acid has remained normal.  Likely elevated in the setting of seizure-like activity.    DVT prophylaxis: Consultants:  Critical Care (signed off)   Procedures:  EEG 12/11/18   Code Status: FULL    Family Communication: Updated wife at bedside    Disposition Plan: Switching to oral vitamin supplementation, seizure prophylaxis, PT evaluation recommend skilled nursing facility      Objective: Vitals:   12/14/18 1140 12/14/18 1628 12/14/18 1948 12/14/18 2007  BP: 118/88 (!) 159/78    Pulse: 99 80 85   Resp: 16 16 19    Temp: 97.6 F (36.4 C) 98.1 F (36.7 C)  97.7 F (36.5 C)  TempSrc: Oral Oral  Oral  SpO2: 100% 96% 97%   Weight:      Height:        Intake/Output Summary (Last 24 hours) at 12/14/2018 2140 Last data filed at 12/14/2018 2128 Gross per 24 hour  Intake 600 ml  Output 550 ml  Net 50 ml   Filed Weights   12/13/18 0002 12/13/18 0439 12/14/18 0421  Weight: 79.5 kg 79.6 kg 78.1 kg    Exam:  Constitutional:normal appearing male, no distress ENMT: EOMI, anicteric Cardiovascular: RRR no MRGs, with no peripheral edema Respiratory: Normal respiratory effort on room air, clear breath sounds    Abdomen: soft, non-tender, normal bowel sounds Skin: No rash ulcers, or lesions. Without skin tenting  Neurologic: Slightly diminished strength in left upper arm otherwise no appreciable focal deficits on my exam.  Able to state wife's name.  Oriented to self, place.  Moving all extremities in the bed on his own.    Data Reviewed: CBC: Recent Labs  Lab 12/10/18 2351 12/11/18 0707  WBC 6.7 5.6  NEUTROABS 4.8  --   HGB 13.9 12.8*  HCT 40.7 38.2*  MCV 92.7 93.4  PLT 150 161*   Basic Metabolic Panel: Recent Labs  Lab 12/10/18 2351 12/11/18 0707 12/13/18 0608 12/14/18 0624  NA 137 137 137 136  K 4.3 4.0 3.8 3.5  CL 102 103 103 104  CO2 24 22 19* 20*  GLUCOSE 94 86 74 85  BUN 10 9 9 8   CREATININE 1.27* 1.24 1.11 1.07  CALCIUM 8.0* 7.6* 8.1* 8.4*  MG 1.7 2.2 1.7  --   PHOS  --  2.6 2.5  --    GFR: Estimated Creatinine Clearance: 77.6 mL/min (by C-G formula based on SCr of 1.07 mg/dL). Liver Function Tests: Recent Labs  Lab 12/10/18 2351 12/13/18 0608 12/14/18 0624  AST 100* 39 41  ALT 76* 39 38  ALKPHOS 60 66 66  BILITOT 1.4* 1.3* 1.4*  PROT 6.5 5.6* 5.8*  ALBUMIN 3.5 3.0* 3.0*   No results for input(s): LIPASE, AMYLASE in the last 168 hours. Recent Labs  Lab 12/10/18 2351  AMMONIA 24   Coagulation Profile: No results for input(s): INR, PROTIME in the last 168 hours. Cardiac Enzymes: Recent Labs  Lab 12/10/18 2351 12/11/18 0707 12/11/18 1319  CKTOTAL 231  --   --   TROPONINI 0.14* 0.20* 0.19*   BNP (last 3 results) No results for input(s): PROBNP in the last 8760 hours. HbA1C: No results for input(s): HGBA1C in the last 72 hours. CBG: Recent Labs  Lab 12/13/18 1146 12/13/18 1911 12/14/18 0048 12/14/18 0645 12/14/18 1128  GLUCAP 81 74 71 83 156*   Lipid Profile: No results for input(s): CHOL, HDL, LDLCALC, TRIG, CHOLHDL, LDLDIRECT in the last 72 hours. Thyroid Function Tests: No results for input(s): TSH, T4TOTAL, FREET4, T3FREE,  THYROIDAB in the last 72 hours. Anemia Panel: No results for input(s): VITAMINB12, FOLATE, FERRITIN, TIBC, IRON, RETICCTPCT in the last 72 hours. Urine analysis: No results found for: COLORURINE, APPEARANCEUR, LABSPEC, PHURINE, GLUCOSEU, HGBUR, BILIRUBINUR, KETONESUR, PROTEINUR, UROBILINOGEN, NITRITE, LEUKOCYTESUR Sepsis Labs: @LABRCNTIP (procalcitonin:4,lacticidven:4)  ) Recent Results (from the past 240 hour(s))  MRSA PCR Screening     Status: None   Collection Time: 12/11/18  6:48 AM  Result Value Ref Range Status   MRSA by PCR NEGATIVE NEGATIVE Final    Comment:        The GeneXpert MRSA Assay (FDA approved for NASAL specimens only), is one component of a comprehensive MRSA colonization surveillance program. It is not intended to diagnose MRSA infection nor to guide or monitor treatment for MRSA infections. Performed at [facility], [place] [address], [place],  [address]       Studies: No results found.  Scheduled Meds: .  amLODipine  10 mg Oral Daily  . carvedilol  3.125 mg Oral BID WC  . chlorhexidine  15 mL Mouth Rinse BID  . folic acid  1 mg Intravenous Daily  . heparin injection (subcutaneous)  5,000 Units Subcutaneous Q8H  . Influenza vac split quadrivalent PF  0.5 mL Intramuscular Tomorrow-1000  . mouth rinse  15 mL Mouth Rinse q12n4p  . pneumococcal 23 valent vaccine  0.5 mL Intramuscular Tomorrow-1000  . thiamine injection  100 mg Intravenous Daily    Continuous Infusions: . levETIRAcetam 500 mg (12/14/18 1016)     LOS: 4 days     Desiree Hane, MD [facility] Pager [phone]  If 7PM-7AM, please contact night-coverage [email or web address] Password [facility] 12/14/2018, 9:40 PM

## 2018-12-14 NOTE — Progress Notes (Signed)
Pt slept most of the night but sometimes he tried to get out of bed, reminded to stay in bed due to his safety of falling, was able to ask for urinal when he needed it, took some applesauce and orange juice at night as he stated was hungry, seizure and fall  precautions in place, no family overnight but daughter called to check on him at night.

## 2018-12-14 NOTE — Care Management Important Message (Signed)
Important Message  Patient Details  Name: Douglas Fowler MRN: [id number] Date of Birth: [birth year]   Medicare Important Message Given:  Yes    Joann Jorge 12/14/2018, 4:07 PM

## 2018-12-15 ENCOUNTER — Inpatient Hospital Stay (HOSPITAL_COMMUNITY): Payer: Medicare HMO

## 2018-12-15 LAB — GLUCOSE, CAPILLARY
Glucose-Capillary: 119 mg/dL — ABNORMAL HIGH (ref 70–99)
Glucose-Capillary: 103 mg/dL — ABNORMAL HIGH (ref 70–99)

## 2018-12-15 LAB — BASIC METABOLIC PANEL
Anion gap: 11 (ref 5–15)
BUN: 11 mg/dL (ref 6–20)
CO2: 23 mmol/L (ref 22–32)
Calcium: 9.2 mg/dL (ref 8.9–10.3)
Chloride: 103 mmol/L (ref 98–111)
Creatinine, Ser: 1.23 mg/dL (ref 0.61–1.24)
GFR calc Af Amer: >60 mL/min (ref >60)
GFR calc non Af Amer: >60 mL/min (ref >60)
Glucose, Bld: 108 mg/dL — ABNORMAL HIGH (ref 70–99)
Potassium: 3.6 mmol/L (ref 3.5–5.1)
Sodium: 137 mmol/L (ref 135–145)

## 2018-12-15 LAB — MAGNESIUM: Magnesium: 1.4 mg/dL — ABNORMAL LOW (ref 1.7–2.4)

## 2018-12-15 MED ORDER — CARVEDILOL 6.25 MG PO TABS
6.2500 mg | ORAL_TABLET | Freq: Two times a day (BID) | ORAL | Status: DC
  Administered 2018-12-15 – 2018-12-18 (×7): 6.25 mg via ORAL
  Filled 2018-12-15 (×7): qty 1

## 2018-12-15 MED ORDER — MAGNESIUM SULFATE 2 GM/50ML IV SOLN
2.0000 g | INTRAVENOUS | Status: AC
  Administered 2018-12-15 (×2): 2 g via INTRAVENOUS
  Filled 2018-12-15 (×3): qty 50

## 2018-12-15 MED ORDER — HALOPERIDOL LACTATE 5 MG/ML IJ SOLN
2.0000 mg | Freq: Once | INTRAMUSCULAR | Status: AC
  Administered 2018-12-15: 2 mg via INTRAVENOUS
  Filled 2018-12-15: qty 1

## 2018-12-15 NOTE — Evaluation (Signed)
Occupational Therapy Evaluation Patient Details Name: Douglas Fowler MRN: [id number] DOB: [birth year] Today's Date: 12/15/2018    History of Present Illness Pt is a 53 y/o male transferred from [place] following AMS, fall and seizure. Thought to be secondary to alcohol withdrawal and delirium. CT of head and spine and EEG negative for acute abnormalities. Troponins slightly elevation, however, per notes, likely secondary to demand ischemia. PMH includes CVA with L sided deficits, HTN, CAD and alcoholism.    Clinical Impression   Pt currently still somewhat lethargic but able to participate in limited OT eval.  Min assist for sit to stand from the bed with max assist for short distance mobility to the left to the bedside chair.  Pt presents with left visual field deficit, as well as left neglect, and left UE weakness.  Currently, he needs mod to max assist for most selfcare tasks at this time.  Discussed with wife that PTA he was staying by himself for periods of time as she had to work.  She also reports issues such as pt attempting cooking tasks without supervision and making a mess, falling, as well as leaving the apartment and accidentally getting in someone elses car on one occasion.  Feel pt is a high risk for fall and will benefit from continued acute care OT while in the hospital.  Feel SNF will be the best option for follow-up rehab as pt will need 24 hour min assist - supervision at discharge and spouse cannot provide this.  Feel spouse will have to figure out longer term options for 24 hour supervision as well.      Follow Up Recommendations  SNF;Supervision/Assistance - 24 hour    Equipment Recommendations  Other (comment)(TBD next venue of care)       Precautions / Restrictions Precautions Precautions: Fall;Other (comment) Precaution Comments: seziure, left neglect, left hemiparesis Restrictions Weight Bearing Restrictions: No      Mobility Bed Mobility Overal bed  mobility: Needs Assistance Bed Mobility: Supine to Sit     Supine to sit: Mod assist Sit to supine: Min assist   General bed mobility comments: Pt needed assistance with bringing LEs off of the bed and then bringing trunk up to sitting from supine.   Transfers Overall transfer level: Needs assistance   Transfers: Sit to/from Stand Sit to Stand: Min assist         General transfer comment: Max assist stand pivot to the left secondary to perceptual and visual deficits.     Balance Overall balance assessment: Needs assistance Sitting-balance support: Bilateral upper extremity supported Sitting balance-Leahy Scale: Poor Sitting balance - Comments: Posterior lean initially with sitting, but improved after sitting up a few mins to min guard.  Postural control: Posterior lean Standing balance support: Bilateral upper extremity supported Standing balance-Leahy Scale: Poor Standing balance comment: Pt needs max assist for dynamic standing balance with functional mobility.                             ADL either performed or assessed with clinical judgement   ADL Overall ADL's : Needs assistance/impaired     Grooming: Minimal assistance   Upper Body Bathing: Minimal assistance;Sitting   Lower Body Bathing: Moderate assistance;Sit to/from stand   Upper Body Dressing : Moderate assistance;Sitting   Lower Body Dressing: Maximal assistance   Toilet Transfer: Maximal assistance;BSC;Stand-pivot   Toileting- Clothing Manipulation and Hygiene: Moderate assistance;Sit to/from stand  Functional mobility during ADLs: Maximal assistance General ADL Comments: Pt needed max assist for short distance mobility from the EOB to the bedside chair on the left side.  Pt resistant to moving to the left with therapist and pulled away, requiring max facilitation to guide to the left with small choppy steps.      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  He needed max assist to guide to the chair.  Pt's spouse reports that he would also miss items to the left of his plate at home as well.             Pertinent Vitals/Pain Pain Assessment: No/denies pain     Hand Dominance Right   Extremity/Trunk Assessment Upper Extremity Assessment Upper Extremity Assessment: LUE deficits/detail LUE Deficits / Details: Pt with shoulder flexion 0-80 degrees in synergy pattern.  Demonstrates full gross digit flexion and extension with decreased ability to complete opposition to the ring and small finger. LUE Sensation: decreased light touch;decreased proprioception LUE Coordination: decreased gross motor;decreased fine motor   Lower Extremity Assessment Lower Extremity Assessment: Defer to PT evaluation   Cervical / Trunk Assessment Cervical / Trunk Assessment: Kyphotic   Communication Communication Communication: Expressive difficulties(slurred speech )   Cognition Arousal/Alertness: Lethargic Behavior During Therapy: Flat affect Overall Cognitive Status: Impaired/Different from baseline Area of Impairment: Orientation;Attention;Memory;Safety/judgement;Problem solving;Awareness                 Orientation Level: Disoriented to;Place;Time;Situation Current Attention Level: Focused Memory: Decreased short-term memory   Safety/Judgement: Decreased awareness of safety Awareness: Intellectual Problem Solving: Difficulty sequencing;Requires tactile cues General Comments: Pt not oriented to place, time, or situation.  He has history of cognitive deficits from recent CVA.  Decreased ability to keep his eyes open during session as well.    General Comments        Exercises     Shoulder Instructions      Home Living Family/patient expects to be discharged to:: Private residence Living Arrangements: Spouse/significant other Available Help at Discharge: Family;Available PRN/intermittently(Spouse cannot provide 24 hour supervision secondary to having to work) Type of Home: House Home Access: Stairs to enter [employer] of Steps: 2-3 Entrance Stairs-Rails: Right Home Layout: One level     Bathroom Shower/Tub: Teacher, early years/pre: Standard Bathroom Accessibility: No   Home Equipment: Shower seat          Prior Functioning/Environment Level of Independence: Independent                 OT Problem List: Decreased strength;Impaired balance (sitting and/or standing);Decreased cognition;Decreased range of motion;Impaired vision/perception;Decreased safety awareness;Decreased activity tolerance;Decreased coordination;Decreased knowledge of use of DME or AE;Impaired sensation;Impaired UE functional use      OT Treatment/Interventions: Self-care/ADL training;DME and/or AE instruction;Balance training;Therapeutic exercise;Manual therapy;Cognitive remediation/compensation;Neuromuscular education;Visual/perceptual remediation/compensation;Patient/family education    OT Goals(Current goals can be found in the care plan section) Acute Rehab OT Goals Patient Stated Goal: Pt unable to state but agreeable to participate in therapy OT Goal Formulation: Patient unable to participate in goal setting Time For Goal Achievement: 12/29/18 Potential to Achieve Goals: Fair  OT Frequency: Min 2X/week   Barriers to D/C: Decreased caregiver support  Spouse is not able to provide 24  hour supervision          AM-PAC OT "6 Clicks" Daily Activity     Outcome Measure Help from another person eating meals?: A Little Help from another person taking care of personal grooming?: A Little Help from another person toileting, which  includes using toliet, bedpan, or urinal?: A Lot Help from another person bathing (including washing, rinsing, drying)?: A Lot Help from another person to put on and taking off regular upper body clothing?: A Lot Help from another person to put on and taking off regular lower body clothing?: A Lot 6 Click Score: 14   End of Session Equipment Utilized During Treatment: Gait belt Nurse Communication: Mobility status  Activity Tolerance: Patient limited by lethargy Patient left: in bed;with call bell/phone within reach;with nursing/sitter in room;with restraints reapplied;with family/visitor present  OT Visit Diagnosis: Unsteadiness on feet (R26.81);Other abnormalities of gait and mobility (R26.89);Repeated falls (R29.6);Muscle weakness (generalized) (M62.81);History of falling (Z91.81);Hemiplegia and hemiparesis;Other symptoms and signs involving cognitive function Hemiplegia - Right/Left: Left Hemiplegia - dominant/non-dominant: Non-Dominant Hemiplegia - caused by: Cerebral infarction                Time: 1610-9604 OT Time Calculation (min): 56 min Charges:  OT General Charges $OT Visit: 1 Visit OT Evaluation $OT Eval Moderate Complexity: 1 Mod OT Treatments $Self Care/Home Management : 38-52 mins    Catherene Kaleta OTR/L 12/15/2018, 10:26 AM

## 2018-12-15 NOTE — Progress Notes (Signed)
Patient alert, combative and confuse, fell early hours of AM. Patient pulling on lines, stated he want to go home. Patient reoriented and redierected as needed, doctor on call paged; on bilateral wrist restraint will continue monitor.

## 2018-12-15 NOTE — Progress Notes (Addendum)
PROGRESS NOTE  HAAKON TITSWORTH JGO:[id number] DOB: September 22, 1966 DOA: 12/10/2018 PCP: Imagene Riches, NP  HPI/Brief Narrative  BENEDICT KUE is a 53 y.o. year old male with medical history significant for HTN, hyperlipidemia and chronic alcoholism who presented on 12/10/2018 with altered mental status after unwitnessed fall and was found to have severe alcohol withdrawal prompting transfer from [place] to [place] on 1/9.  Patient's wife provides history stating patient typically drinks 1/5 of hard liquor daily was in his usual state of health the day prior to admission in the morning time when she suddenly found him lying face down on the floor.  When she turned him over there is bleeding where he had bit his tongue.  He was sent to [facility] where he had a witnessed seizure per wife and x-ray holding.  He was then transferred to [facility] for further evaluation  Subjective Report overnight the patient had unwitnessed fall, agitated and required being placed back in restraints. This morning is very drowsy (had a dose of Haldol at 3 AM)   Assessment/Plan:  Hospital-acquired delirium Patient's mental status at baseline seems consistent with dementia per wife/related to vascular events (CVA), longtime alcoholism.  At baseline patient is oriented to self, others, time.  Despite unrevealing with active withdrawal patient has had delirious episodes requiring sitter at bedside for safety.  Delirium precautions.  Unwitnessed fall, occurred in the hospital overnight on 1/13.  Very drowsy on exam this a.m., CT head shows no acute abnormalities.  Suspect drowsiness related to Haldol administration earlier this morning.  Patient has nonfocal neurologic exam.  Closely monitor.  Continue delirium precautions  Seizure, suspect related to alcohol withdrawal, stable Patient has multiple risk factors for seizures given recent stroke in September, chronic alcoholism and currently going through withdrawal.  EEG  shows no focal activity, CT head with no acute abnormalities, CT spine unremarkable.  Has not had recurrent seizure here.  Discussed case with neurologist who recommended long-term AED for seizures related to alcohol withdrawal, will continue Keppra, folic and thiamine, continue neurochecks.  Alcohol withdrawal with delirium, continues to improve No longer requiring scheduled Ativan, continue to monitor CIWA protocol believe we have entered the last days of his withdrawal symptoms given his mental status is closer to baseline.  He has passed swallow screen tolerating diet.  Now on oral folate, thiamine and multivitamin supplementation.    History of CVA, has residual weakness on left side Per wife has not been adherent with his medication regimen.  Neurochecks  Hypertension, improved control Tolerating reinitiation of home oral BP medications: Amlodipine and Coreg (increased to 6.25 twice daily resubmission), continue to monitor.  IV hydralazine PRN  Hypomagnesemia, related to poor nutritional status from alcohol substance abuse Mag 1.4, replete with IV on 1/14.  Continue to closely monitor  Elevated troponin, resolved Peak of 0.2 has now down trended.  Likely demand ischemia in the setting of alcohol withdrawal.  EKG nonischemic.  No chest pain  Transaminitis, likely alcoholic hepatitis, resolved Continue to monitor CMP   Lactic acidosis, resolved Reported lactic acid of 19 at [facility], here lactic acid has remained normal.  Likely elevated in the setting of seizure-like activity.    DVT prophylaxis: Consultants:  Critical Care (signed off)   Procedures:  EEG 12/11/18   Code Status: FULL    Family Communication: Updated wife at bedside    Disposition Plan: Ensure improvement in mental status (addressed above related to Haldol administration), Education officer, museum for SNF,  PT evaluation recommend skilled nursing facility      Objective: Vitals:   12/15/18 0715 12/15/18  0956 12/15/18 1100 12/15/18 1102  BP: 126/80 (!) 193/108 (!) 148/81 (!) 148/81  Pulse:  89 75   Resp: 18  14 12   Temp: 98.5 F (36.9 C)   98.6 F (37 C)  TempSrc: Axillary     SpO2: 98%  96% 100%  Weight:      Height:        Intake/Output Summary (Last 24 hours) at 12/15/2018 1416 Last data filed at 12/15/2018 0718 Gross per 24 hour  Intake 120 ml  Output 425 ml  Net -305 ml   Filed Weights   12/13/18 0002 12/13/18 0439 12/14/18 0421  Weight: 79.5 kg 79.6 kg 78.1 kg    Exam:  Constitutional:normal appearing male, no distress, lying in bed comfortably ENMT: EOMI, anicteric Cardiovascular: RRR no MRGs, with no peripheral edema Respiratory: Normal respiratory effort on room air, clear breath sounds  Abdomen: soft, non-tender, normal bowel sounds Skin: No rash ulcers, or lesions. Without skin tenting  Neurologic: Slightly diminished strength in left upper arm otherwise no appreciable focal deficits on my exam.    Oriented to self, place.  Moving all extremities in the bed on his own and following commands.    Data Reviewed: CBC: Recent Labs  Lab 12/10/18 2351 12/11/18 0707  WBC 6.7 5.6  NEUTROABS 4.8  --   HGB 13.9 12.8*  HCT 40.7 38.2*  MCV 92.7 93.4  PLT 150 381*   Basic Metabolic Panel: Recent Labs  Lab 12/10/18 2351 12/11/18 0707 12/13/18 0608 12/14/18 0624 12/15/18 0435  NA 137 137 137 136 137  K 4.3 4.0 3.8 3.5 3.6  CL 102 103 103 104 103  CO2 24 22 19* 20* 23  GLUCOSE 94 86 74 85 108*  BUN 10 9 9 8 11   CREATININE 1.27* 1.24 1.11 1.07 1.23  CALCIUM 8.0* 7.6* 8.1* 8.4* 9.2  MG 1.7 2.2 1.7  --  1.4*  PHOS  --  2.6 2.5  --   --    GFR: Estimated Creatinine Clearance: 67.5 mL/min (by C-G formula based on SCr of 1.23 mg/dL). Liver Function Tests: Recent Labs  Lab 12/10/18 2351 12/13/18 0608 12/14/18 0624  AST 100* 39 41  ALT 76* 39 38  ALKPHOS 60 66 66  BILITOT 1.4* 1.3* 1.4*  PROT 6.5 5.6* 5.8*  ALBUMIN 3.5 3.0* 3.0*   No results for  input(s): LIPASE, AMYLASE in the last 168 hours. Recent Labs  Lab 12/10/18 2351  AMMONIA 24   Coagulation Profile: No results for input(s): INR, PROTIME in the last 168 hours. Cardiac Enzymes: Recent Labs  Lab 12/10/18 2351 12/11/18 0707 12/11/18 1319  CKTOTAL 231  --   --   TROPONINI 0.14* 0.20* 0.19*   BNP (last 3 results) No results for input(s): PROBNP in the last 8760 hours. HbA1C: No results for input(s): HGBA1C in the last 72 hours. CBG: Recent Labs  Lab 12/13/18 1911 12/14/18 0048 12/14/18 0645 12/14/18 1128 12/15/18 1108  GLUCAP 74 71 83 156* 119*   Lipid Profile: No results for input(s): CHOL, HDL, LDLCALC, TRIG, CHOLHDL, LDLDIRECT in the last 72 hours. Thyroid Function Tests: No results for input(s): TSH, T4TOTAL, FREET4, T3FREE, THYROIDAB in the last 72 hours. Anemia Panel: No results for input(s): VITAMINB12, FOLATE, FERRITIN, TIBC, IRON, RETICCTPCT in the last 72 hours. Urine analysis: No results found for: COLORURINE, APPEARANCEUR, [place], [place], [place], [place], [place], [place], [place],  UROBILINOGEN, NITRITE, LEUKOCYTESUR Sepsis Labs: @LABRCNTIP (procalcitonin:4,lacticidven:4)  ) Recent Results (from the past 240 hour(s))  MRSA PCR Screening     Status: None   Collection Time: 12/11/18  6:48 AM  Result Value Ref Range Status   MRSA by PCR NEGATIVE NEGATIVE Final    Comment:        The GeneXpert MRSA Assay (FDA approved for NASAL specimens only), is one component of a comprehensive MRSA colonization surveillance program. It is not intended to diagnose MRSA infection nor to guide or monitor treatment for MRSA infections. Performed at [facility], [place] [address], [place], [place] [address]       Studies: Ct Head Wo Contrast  Result Date: 12/15/2018 CLINICAL DATA:  Unwitnessed fall last night. Increased drowsiness today. History of stroke. EXAM: CT HEAD WITHOUT CONTRAST TECHNIQUE: Contiguous axial images were obtained  from the base of the skull through the vertex without intravenous contrast. COMPARISON:  CT head dated December 10, 2018. FINDINGS: Brain: No evidence of acute infarction, hemorrhage, hydrocephalus, extra-axial collection or mass lesion/mass effect. Chronic infarcts involving the right frontal, right parietal, right occipital, and left parietooccipital lobes as well as the left internal capsule and left basal ganglia are unchanged. Stable mild atrophy. Vascular: Atherosclerotic vascular calcification of the carotid siphons. No hyperdense vessel. Skull: Negative for fracture or focal lesion. Sinuses/Orbits: No acute finding. Other: None. IMPRESSION: 1.  No acute intracranial abnormality. 2. Unchanged multiple bilateral chronic infarcts. Electronically Signed   By: Titus Dubin M.D.   On: 12/15/2018 12:29    Scheduled Meds: . amLODipine  10 mg Oral Daily  . carvedilol  6.25 mg Oral BID WC  . chlorhexidine  15 mL Mouth Rinse BID  . folic acid  1 mg Oral Daily  . heparin injection (subcutaneous)  5,000 Units Subcutaneous Q8H  . Influenza vac split quadrivalent PF  0.5 mL Intramuscular Tomorrow-1000  . levETIRAcetam  500 mg Oral BID  . mouth rinse  15 mL Mouth Rinse q12n4p  . multivitamin  1 tablet Oral Daily  . pneumococcal 23 valent vaccine  0.5 mL Intramuscular Tomorrow-1000  . thiamine  100 mg Oral Daily    Continuous Infusions: . magnesium sulfate 1 - 4 g bolus IVPB       LOS: 5 days     Desiree Hane, MD [facility] Pager [phone]  If 7PM-7AM, please contact night-coverage [email or web address] Password [email or web address] 12/15/2018, 2:16 PM

## 2018-12-15 NOTE — Progress Notes (Signed)
Restraints taken off at 1000 per MD order

## 2018-12-16 DIAGNOSIS — R569 Unspecified convulsions: Secondary | ICD-10-CM

## 2018-12-16 DIAGNOSIS — I6300 Cerebral infarction due to thrombosis of unspecified precerebral artery: Secondary | ICD-10-CM

## 2018-12-16 DIAGNOSIS — F10239 Alcohol dependence with withdrawal, unspecified: Secondary | ICD-10-CM

## 2018-12-16 LAB — CBC WITH DIFFERENTIAL/PLATELET
Abs Immature Granulocytes: 0.09 10*3/uL — ABNORMAL HIGH (ref 0.00–0.07)
Basophils Absolute: 0.1 10*3/uL (ref 0.0–0.1)
Basophils Relative: 1 %
Eosinophils Absolute: 0.3 10*3/uL (ref 0.0–0.5)
Eosinophils Relative: 5 %
HCT: 37.8 % — ABNORMAL LOW (ref 39.0–52.0)
Hemoglobin: 12.5 g/dL — ABNORMAL LOW (ref 13.0–17.0)
Immature Granulocytes: 2 %
Lymphocytes Relative: 20 %
Lymphs Abs: 1.2 10*3/uL (ref 0.7–4.0)
MCH: 30.1 pg (ref 26.0–34.0)
MCHC: 33.1 g/dL (ref 30.0–36.0)
MCV: 91.1 fL (ref 80.0–100.0)
Monocytes Absolute: 0.7 10*3/uL (ref 0.1–1.0)
Monocytes Relative: 11 %
Neutro Abs: 3.6 10*3/uL (ref 1.7–7.7)
Neutrophils Relative %: 61 %
Platelets: 172 10*3/uL (ref 150–400)
RBC: 4.15 MIL/uL — AB (ref 4.22–5.81)
RDW: 15.6 % — AB (ref 11.5–15.5)
WBC: 5.9 10*3/uL (ref 4.0–10.5)
nRBC: 0 % (ref 0.0–0.2)

## 2018-12-16 LAB — COMPREHENSIVE METABOLIC PANEL
ALT: 30 U/L (ref 0–44)
AST: 27 U/L (ref 15–41)
Albumin: 3.1 g/dL — ABNORMAL LOW (ref 3.5–5.0)
Alkaline Phosphatase: 66 U/L (ref 38–126)
Anion gap: 10 (ref 5–15)
BUN: 12 mg/dL (ref 6–20)
CO2: 23 mmol/L (ref 22–32)
Calcium: 9 mg/dL (ref 8.9–10.3)
Chloride: 103 mmol/L (ref 98–111)
Creatinine, Ser: 1.22 mg/dL (ref 0.61–1.24)
GFR calc Af Amer: >60 mL/min (ref >60)
GFR calc non Af Amer: >60 mL/min (ref >60)
Glucose, Bld: 106 mg/dL — ABNORMAL HIGH (ref 70–99)
POTASSIUM: 3.4 mmol/L — AB (ref 3.5–5.1)
Sodium: 136 mmol/L (ref 135–145)
Total Bilirubin: 0.7 mg/dL (ref 0.3–1.2)
Total Protein: 6 g/dL — ABNORMAL LOW (ref 6.5–8.1)

## 2018-12-16 LAB — GLUCOSE, CAPILLARY
Glucose-Capillary: 106 mg/dL — ABNORMAL HIGH (ref 70–99)
Glucose-Capillary: 132 mg/dL — ABNORMAL HIGH (ref 70–99)
Glucose-Capillary: 137 mg/dL — ABNORMAL HIGH (ref 70–99)
Glucose-Capillary: 94 mg/dL (ref 70–99)

## 2018-12-16 LAB — MAGNESIUM: Magnesium: 1.9 mg/dL (ref 1.7–2.4)

## 2018-12-16 LAB — PHOSPHORUS: Phosphorus: 3.6 mg/dL (ref 2.5–4.6)

## 2018-12-16 NOTE — Progress Notes (Signed)
12/16/18 1747  PT Visit Information  Last PT Received On 12/16/18  Assistance Needed +1 (+2 for safety )  History of Present Illness Pt is a 53 y/o male transferred from [place] following AMS, fall and seizure. Thought to be secondary to alcohol withdrawal and delirium. CT of head and spine and EEG negative for acute abnormalities. Troponins slightly elevation, however, per notes, likely secondary to demand ischemia. PMH includes CVA with L sided deficits, HTN, CAD and alcoholism.   Subjective Data  Patient Stated Goal No goal stated   Precautions  Precautions Fall;Other (comment)  Precaution Comments seziure, left neglect, left hemiparesis  Restrictions  Weight Bearing Restrictions No  Pain Assessment  Pain Assessment No/denies pain  Cognition  Arousal/Alertness Awake/alert  Behavior During Therapy WFL for tasks assessed/performed  Overall Cognitive Status Impaired/Different from baseline  Area of Impairment Orientation;Attention;Memory;Safety/judgement;Problem solving;Awareness  Orientation Level Disoriented to;Place;Situation  Current Attention Level Sustained  Memory Decreased short-term memory  Safety/Judgement Decreased awareness of safety  Awareness Intellectual  Problem Solving Slow processing  Bed Mobility  Overal bed mobility Needs Assistance  Bed Mobility Supine to Sit;Sit to Supine  Supine to sit Min guard  Sit to supine Supervision  General bed mobility comments Min guard for safety to come to sitting.   Transfers  Overall transfer level Needs assistance  Equipment used 2 person hand held assist  Transfers Sit to/from Stand  Sit to Stand Min assist;+2 physical assistance  General transfer comment Min A for lift assist and steadying upon standing.   Ambulation/Gait  Ambulation/Gait assistance Min assist;Mod assist;+2 physical assistance  Gait Distance (Feet) 8 Feet  Assistive device 2 person hand held assist  Gait Pattern/deviations Step-through  pattern;Decreased stride length  General Gait Details Pt unsteady, especially when walking backward. noted LOB X3 during short distance ambulation within the room with 2 person HHA.   Gait velocity Decreased   Balance  Overall balance assessment Needs assistance  Sitting-balance support Bilateral upper extremity supported  Sitting balance-Leahy Scale Fair  Standing balance support Bilateral upper extremity supported  Standing balance-Leahy Scale Poor  Standing balance comment Reliant on BUE and external assist for standing balance.   General Comments  General comments (skin integrity, edema, etc.) Safety sitter present in room   Exercises  Exercises Other exercises  Other Exercises  Other Exercises Performed BUE reaching exercises with BUE extremities. Given pt's L neglect, had difficulty reaching for objects on the L and required multimodal cues to find objects on the L.   Other Exercises Performed standing marching task to work on SLS and dynamic balance. Pt requiring Bilateral HHA and min A secondary to unsteadiness.   PT - End of Session  Equipment Utilized During Treatment Gait belt  Activity Tolerance Patient tolerated treatment well  Patient left in bed;with call bell/phone within reach;with nursing/sitter in room  Nurse Communication Mobility status   PT - Assessment/Plan  PT Plan Current plan remains appropriate  PT Visit Diagnosis Muscle weakness (generalized) (M62.81);History of falling (Z91.81);Other symptoms and signs involving the nervous system (R29.898);Other abnormalities of gait and mobility (R26.89)  PT Frequency (ACUTE ONLY) Min 2X/week  Recommendations for Other Services OT consult  Follow Up Recommendations SNF;Supervision/Assistance - 24 hour  PT equipment None recommended by PT  AM-PAC PT "6 Clicks" Mobility Outcome Measure (Version 2)  Help needed turning from your back to your side while in a flat bed without using bedrails? 3  Help needed moving from lying on  your back to sitting  on the side of a flat bed without using bedrails? 3  Help needed moving to and from a bed to a chair (including a wheelchair)? 2  Help needed standing up from a chair using your arms (e.g., wheelchair or bedside chair)? 2  Help needed to walk in hospital room? 2  Help needed climbing 3-5 steps with a railing?  1  6 Click Score 13  Consider Recommendation of Discharge To: CIR/SNF/LTACH  PT Goal Progression  Progress towards PT goals Progressing toward goals  Acute Rehab PT Goals  PT Goal Formulation With patient  Time For Goal Achievement 12/28/18  Potential to Achieve Goals Fair  PT Time Calculation  PT Start Time (ACUTE ONLY) 1725  PT Stop Time (ACUTE ONLY) 1740  PT Time Calculation (min) (ACUTE ONLY) 15 min  PT General Charges  $$ ACUTE PT VISIT 1 Visit  PT Treatments  $Gait Training 8-22 mins   Pt progressing towards goals, however, continues to present with L sided neglect, unsteadiness, and cognitive deficits. Required min-mod A +2 with bilateral HHA secondary to unsteadiness. Pt with LOB X3 during short distance gait. Current recommendations appropriate. Will continue to follow acutely to maximize functional mobility independence and safety.   Leighton Ruff, PT, DPT  Acute Rehabilitation Services  Pager: [phone] Office: [phone]

## 2018-12-16 NOTE — Progress Notes (Signed)
Occupational Therapy Treatment Patient Details Name: Douglas Fowler MRN: [id number] DOB: Aug 30, 1966 Today's Date: 12/16/2018    History of present illness Pt is a 53 y/o male transferred from [place] following AMS, fall and seizure. Thought to be secondary to alcohol withdrawal and delirium. CT of head and spine and EEG negative for acute abnormalities. Troponins slightly elevation, however, per notes, likely secondary to demand ischemia. PMH includes CVA with L sided deficits, HTN, CAD and alcoholism.    OT comments  Pt progressing toward OT goals, with improvement observed today in ADLs and functional mobility. Pt required min guard during bed mobility, min A for grooming, and min A for dynamic standing balance. Still anticipate SNF is most appropriate d/c d/t poor safety awareness, high fall risk, and balance deficits.    Follow Up Recommendations  SNF;Supervision/Assistance - 24 hour    Equipment Recommendations  None recommended by OT       Precautions / Restrictions Precautions Precautions: Fall;Other (comment) Precaution Comments: seziure, left neglect, left hemiparesis Restrictions Weight Bearing Restrictions: No       Mobility Bed Mobility Overal bed mobility: Needs Assistance Bed Mobility: Supine to Sit     Supine to sit: Min guard     General bed mobility comments: Pt able to transition to EOB with min guard this session  Transfers Overall transfer level: Needs assistance Equipment used: None Transfers: Sit to/from Stand Sit to Stand: Min assist              Balance Overall balance assessment: Needs assistance Sitting-balance support: Bilateral upper extremity supported Sitting balance-Leahy Scale: Fair     Standing balance support: Bilateral upper extremity supported Standing balance-Leahy Scale: Poor Standing balance comment: Min A for dynamic standing balance                           ADL either performed or assessed with  clinical judgement   ADL Overall ADL's : Needs assistance/impaired     Grooming: Minimal assistance                               Functional mobility during ADLs: Minimal assistance General ADL Comments: pt completed sit <> stand and lateral stepping with min A     Vision   Vision Assessment?: Vision impaired- to be further tested in functional context          Cognition Arousal/Alertness: Lethargic Behavior During Therapy: [facility] for tasks assessed/performed(pt joking with therapist this session) Overall Cognitive Status: Impaired/Different from baseline Area of Impairment: Orientation;Attention;Memory;Safety/judgement;Problem solving;Awareness                 Orientation Level: Disoriented to;Place;Time;Situation Current Attention Level: Sustained Memory: Decreased short-term memory   Safety/Judgement: Decreased awareness of safety Awareness: Intellectual Problem Solving: Slow processing                     Pertinent Vitals/ Pain       Pain Assessment: No/denies pain  Home Living   Prior Functioning/Environment    Frequency  Min 2X/week        Progress Toward Goals  OT Goals(current goals can now be found in the care plan section)  Progress towards OT goals: Progressing toward goals  Acute Rehab OT Goals Patient Stated Goal: No goal stated  OT Goal Formulation: With patient Time For Goal Achievement: 12/29/18 Potential to Achieve Goals: Fair  Plan Discharge plan remains appropriate       AM-PAC OT "6 Clicks" Daily Activity     Outcome Measure   Help from another person eating meals?: A Little Help from another person taking care of personal grooming?: A Little Help from another person toileting, which includes using toliet, bedpan, or urinal?: A Lot Help from another person bathing (including washing, rinsing, drying)?: A Lot Help from another person to put on and taking off regular upper body clothing?: A Little Help from  another person to put on and taking off regular lower body clothing?: A Lot 6 Click Score: 15    End of Session Equipment Utilized During Treatment: Gait belt  OT Visit Diagnosis: Unsteadiness on feet (R26.81);Other abnormalities of gait and mobility (R26.89);Repeated falls (R29.6);Muscle weakness (generalized) (M62.81);History of falling (Z91.81);Hemiplegia and hemiparesis;Other symptoms and signs involving cognitive function Hemiplegia - Right/Left: Left Hemiplegia - dominant/non-dominant: Non-Dominant Hemiplegia - caused by: Cerebral infarction   Activity Tolerance Patient tolerated treatment well   Patient Left in bed;with call bell/phone within reach;with nursing/sitter in room;with restraints reapplied;with family/visitor present   Nurse Communication Mobility status        Time: 8264-1583 OT Time Calculation (min): 14 min  Charges: OT General Charges $OT Visit: 1 Visit OT Treatments $Self Care/Home Management : 8-22 mins   Curtis Sites OTR/L  12/16/2018, 3:31 PM

## 2018-12-16 NOTE — Progress Notes (Signed)
PROGRESS NOTE    Douglas Fowler  YSA:[id number] DOB: 05-Oct-1966 DOA: 12/10/2018 PCP: Imagene Riches, NP   Brief Narrative:  Douglas Fowler is a 53 y.o. year old male with medical history significant for HTN, hyperlipidemia and chronic alcoholism who presented on 12/10/2018 with altered mental status after unwitnessed fall and was found to have severe alcohol withdrawal prompting transfer from [place] to [place] on 1/9.  Patient's wife provides history stating patient typically drinks 1/5 of hard liquor daily was in his usual state of health the day prior to admission in the morning time when she suddenly found him lying face down on the floor.  When she turned him over there is bleeding where he had bit his tongue.  He was sent to [facility] where he had a witnessed seizure per wife and x-ray holding.  He was then transferred to [facility] for further evaluation  **Improving but unwitnessed fall on 12/14/2018.  Head CT was negative and showed no acute intracranial abnormalities and unchanged multiple bilateral chronic infarcts.  PT OT evaluating and OT recommending skilled nursing facility currently.  Improving from a withdrawal standpoint.  Assessment & Plan:   Principal Problem:   Alcohol withdrawal delirium (HCC) Active Problems:   CVA (cerebral vascular accident) ([place])   Essential hypertension   CAD (coronary artery disease)   Alcohol withdrawal ([place])   Alcohol withdrawal seizure with complication, with unspecified complication ([place])  Hospital-acquired Delirium -Patient's mental status at baseline seems consistent with dementia per wife/related to vascular events (CVA), longtime alcoholism.   -At baseline patient is oriented to self, others, time.   -Despite unrevealing with active withdrawal patient has had delirious episodes requiring sitter at bedside for safety. -C/w Delirium precautions.  Unwitnessed fall, occurred in the hospital overnight on 1/13.   -WasVery drowsy on exam  yesterday a.m. but was suspected drowsiness related to Haldol administration -Repeat CT head shows no acute abnormalities. -Closely monitor.   -Continue delirium precautions -PT/OT to evaluate and Treat; OT recommending SNF currently   Seizure, suspect related to alcohol withdrawal, stable -Patient has multiple risk factors for seizures given recent stroke in September, chronic alcoholism and currently going through withdrawal.   -EEG shows no focal activity, CT head with no acute abnormalities,  -CT spine unremarkable.  Has not had recurrent seizure here.   -Discussed case with Neurologist who recommended long-term AED for seizures related to alcohol withdrawal -Will continue Keppra 323 mg po BID, folic and thiamine, continue neurochecks.  Alcohol Withdrawal with Delirium -Continues to improve Daily; Was alert to Self and Place but not Year or Date -No longer requiring scheduled Ativan, continue to monitor CIWA protocol believe we have entered the last days of his withdrawal symptoms given his mental status is closer to baseline.   -He has passed swallow screen tolerating diet.   -Now on oral folate, thiamine and multivitamin supplementation and will continue     History of CVA, has residual weakness on left side -Per wife has not been adherent with his medication regimen.   -C/w Neurochecks  Hypertension, improved control -Tolerating reinitiation of home oral BP medications Amlodipine 10 mg po Daily and Coreg (increased to 6.25 twice daily since admission) -Continue to monitor. -C/w IV Hydralazine PRN  Hypomagnesemia -Related to poor nutritional status from alcohol substance abuse -Mag 1.4 was replete with IV on 1/14 and now improved to 1.9.  -Continue to Monitor and Replete as Necessary   Elevated Troponin, resolved -Peak of 0.2 has now  down trended.  Likely demand ischemia in the setting of alcohol withdrawal.  EKG nonischemic.  No chest pain  Transaminitis/Abnormal  LFTs -Likely alcoholic hepatitis, resolved -Continue to monitor CMP   Lactic Acidosis, resolved -Reported lactic acid of 19 at [facility],  -here lactic acid has remained normal.   -Likely elevated in the setting of seizure-like activity.  Hypokalemia -Patient's K+ this AM was 3.4 -Replete with po KCl 40 mEQ BID x2 doses -Continue to Monitor and Replete as Necessary -Repeat CMP in AM   Overweight -Estimated body mass index is 29.21 kg/m as calculated from the following:   Height as of this encounter: 5\' 6"  (1.676 m).   Weight as of this encounter: 82.1 kg. -Weight Loss Counseling Given   DVT prophylaxis: Heparin 5,000 units sq q8h Code Status: FULL CODE Family Communication: Discussed with Wife at Bedside Disposition Plan: SNF if agreeable   Consultants:   Case was Discussed with Neurology during this Admission by my Colleague   Procedures:    Antimicrobials:  Anti-infectives (From admission, onward)   None     Subjective: Seen and examined at bedside and states that he was "feeling much better".  Was alert and oriented x2.  No chest pain, lightheadedness or dizziness.  No nausea or vomiting.  Wife was updated at bedside and she inquired about the head CT.  No other concerns or complaints at this time  Objective: Vitals:   12/16/18 0627 12/16/18 0628 12/16/18 0812 12/16/18 1147  BP:   134/73 107/80  Pulse: 76 80 92 86  Resp:   20 18  Temp:   99 F (37.2 C)   TempSrc:   Oral   SpO2: 99% 95% 98% 97%  Weight:      Height:        Intake/Output Summary (Last 24 hours) at 12/16/2018 1256 Last data filed at 12/16/2018 1149 Gross per 24 hour  Intake 480 ml  Output -  Net 480 ml   Filed Weights   12/13/18 0439 12/14/18 0421 12/16/18 0300  Weight: 79.6 kg 78.1 kg 82.1 kg   Examination: Physical Exam:  Constitutional: WN/WD overweight Caucasian male in NAD and appears calm  Eyes: Lids and conjunctivae normal, sclerae anicteric  ENMT: External Ears,  Nose appear normal. Grossly normal hearing. Mucous membranes are moist.  Neck: Appears normal, supple, no cervical masses, normal ROM, no appreciable thyromegaly; no JVD Respiratory: Diminished to auscultation bilaterally, no wheezing, rales, rhonchi or crackles. Normal respiratory effort and patient is not tachypenic. No accessory muscle use.  Cardiovascular: RRR, no murmurs / rubs / gallops. S1 and S2 auscultated. No extremity edema.  Abdomen: Soft, non-tender, non-distended. No masses palpated. No appreciable hepatosplenomegaly. Bowel sounds positive x4.  GU: Deferred. Musculoskeletal: No clubbing / cyanosis of digits/nails. No joint deformity upper and lower extremities.  Skin: No rashes, lesions, ulcers on a limited skin evaluation. No induration; Warm and dry.  Neurologic: CN 2-12 grossly intact with no focal deficits.  Romberg sign and cerebellar reflexes not assessed.  Psychiatric: Slightly impaired judgment and insight. Alert and oriented x 2. Normal mood and appropriate affect.   Data Reviewed: I have personally reviewed following labs and imaging studies  CBC: Recent Labs  Lab 12/10/18 2351 12/11/18 0707 12/16/18 0823  WBC 6.7 5.6 5.9  NEUTROABS 4.8  --  3.6  HGB 13.9 12.8* 12.5*  HCT 40.7 38.2* 37.8*  MCV 92.7 93.4 91.1  PLT 150 137* 778   Basic Metabolic Panel: Recent Labs  Lab  12/10/18 2351 12/11/18 0707 12/13/18 0608 12/14/18 0624 12/15/18 0435 12/16/18 0823  NA 137 137 137 136 137 136  K 4.3 4.0 3.8 3.5 3.6 3.4*  CL 102 103 103 104 103 103  CO2 24 22 19* 20* 23 23  GLUCOSE 94 86 74 85 108* 106*  BUN 10 9 9 8 11 12   CREATININE 1.27* 1.24 1.11 1.07 1.23 1.22  CALCIUM 8.0* 7.6* 8.1* 8.4* 9.2 9.0  MG 1.7 2.2 1.7  --  1.4* 1.9  PHOS  --  2.6 2.5  --   --  3.6   GFR: Estimated Creatinine Clearance: 69.6 mL/min (by C-G formula based on SCr of 1.22 mg/dL). Liver Function Tests: Recent Labs  Lab 12/10/18 2351 12/13/18 0608 12/14/18 0624 12/16/18 0823  AST  100* 39 41 27  ALT 76* 39 38 30  ALKPHOS 60 66 66 66  BILITOT 1.4* 1.3* 1.4* 0.7  PROT 6.5 5.6* 5.8* 6.0*  ALBUMIN 3.5 3.0* 3.0* 3.1*   No results for input(s): LIPASE, AMYLASE in the last 168 hours. Recent Labs  Lab 12/10/18 2351  AMMONIA 24   Coagulation Profile: No results for input(s): INR, PROTIME in the last 168 hours. Cardiac Enzymes: Recent Labs  Lab 12/10/18 2351 12/11/18 0707 12/11/18 1319  CKTOTAL 231  --   --   TROPONINI 0.14* 0.20* 0.19*   BNP (last 3 results) No results for input(s): PROBNP in the last 8760 hours. HbA1C: No results for input(s): HGBA1C in the last 72 hours. CBG: Recent Labs  Lab 12/15/18 1108 12/15/18 1713 12/16/18 0002 12/16/18 0716 12/16/18 1151  GLUCAP 119* 103* 132* 106* 137*   Lipid Profile: No results for input(s): CHOL, HDL, LDLCALC, TRIG, CHOLHDL, LDLDIRECT in the last 72 hours. Thyroid Function Tests: No results for input(s): TSH, T4TOTAL, FREET4, T3FREE, THYROIDAB in the last 72 hours. Anemia Panel: No results for input(s): VITAMINB12, FOLATE, FERRITIN, TIBC, IRON, RETICCTPCT in the last 72 hours. Sepsis Labs: Recent Labs  Lab 12/10/18 2351 12/11/18 0354  LATICACIDVEN 1.4 1.0    Recent Results (from the past 240 hour(s))  MRSA PCR Screening     Status: None   Collection Time: 12/11/18  6:48 AM  Result Value Ref Range Status   MRSA by PCR NEGATIVE NEGATIVE Final    Comment:        The GeneXpert MRSA Assay (FDA approved for NASAL specimens only), is one component of a comprehensive MRSA colonization surveillance program. It is not intended to diagnose MRSA infection nor to guide or monitor treatment for MRSA infections. Performed at  [facility], [place] [address], [place], [place] [address]     Radiology Studies: Ct Head Wo Contrast  Result Date: 12/15/2018 CLINICAL DATA:  Unwitnessed fall last night. Increased drowsiness today. History of stroke. EXAM: CT HEAD WITHOUT CONTRAST TECHNIQUE: Contiguous  axial images were obtained from the base of the skull through the vertex without intravenous contrast. COMPARISON:  CT head dated December 10, 2018. FINDINGS: Brain: No evidence of acute infarction, hemorrhage, hydrocephalus, extra-axial collection or mass lesion/mass effect. Chronic infarcts involving the right frontal, right parietal, right occipital, and left parietooccipital lobes as well as the left internal capsule and left basal ganglia are unchanged. Stable mild atrophy. Vascular: Atherosclerotic vascular calcification of the carotid siphons. No hyperdense vessel. Skull: Negative for fracture or focal lesion. Sinuses/Orbits: No acute finding. Other: None. IMPRESSION: 1.  No acute intracranial abnormality. 2. Unchanged multiple bilateral chronic infarcts. Electronically Signed   By: Titus Dubin  M.D.   On: 12/15/2018 12:29   Scheduled Meds: . amLODipine  10 mg Oral Daily  . carvedilol  6.25 mg Oral BID WC  . chlorhexidine  15 mL Mouth Rinse BID  . folic acid  1 mg Oral Daily  . heparin injection (subcutaneous)  5,000 Units Subcutaneous Q8H  . Influenza vac split quadrivalent PF  0.5 mL Intramuscular Tomorrow-1000  . levETIRAcetam  500 mg Oral BID  . mouth rinse  15 mL Mouth Rinse q12n4p  . multivitamin  1 tablet Oral Daily  . pneumococcal 23 valent vaccine  0.5 mL Intramuscular Tomorrow-1000  . thiamine  100 mg Oral Daily   Continuous Infusions:   LOS: 6 days   Kerney Elbe, DO [facility] PAGER is on AMION  If 7PM-7AM, please contact night-coverage [email or web address] Password [facility] 12/16/2018, 12:56 PM

## 2018-12-17 LAB — GLUCOSE, CAPILLARY
Glucose-Capillary: 112 mg/dL — ABNORMAL HIGH (ref 70–99)
Glucose-Capillary: 121 mg/dL — ABNORMAL HIGH (ref 70–99)
Glucose-Capillary: 132 mg/dL — ABNORMAL HIGH (ref 70–99)
Glucose-Capillary: 111 mg/dL — ABNORMAL HIGH (ref 70–99)

## 2018-12-17 LAB — CBC WITH DIFFERENTIAL/PLATELET
Abs Immature Granulocytes: 0.21 10*3/uL — ABNORMAL HIGH (ref 0.00–0.07)
BASOS PCT: 1 %
Basophils Absolute: 0.1 10*3/uL (ref 0.0–0.1)
Eosinophils Absolute: 0.2 10*3/uL (ref 0.0–0.5)
Eosinophils Relative: 4 %
HCT: 36.6 % — ABNORMAL LOW (ref 39.0–52.0)
Hemoglobin: 12.1 g/dL — ABNORMAL LOW (ref 13.0–17.0)
Immature Granulocytes: 4 %
Lymphocytes Relative: 27 %
Lymphs Abs: 1.5 10*3/uL (ref 0.7–4.0)
MCH: 30.3 pg (ref 26.0–34.0)
MCHC: 33.1 g/dL (ref 30.0–36.0)
MCV: 91.5 fL (ref 80.0–100.0)
Monocytes Absolute: 0.8 10*3/uL (ref 0.1–1.0)
Monocytes Relative: 14 %
Neutro Abs: 2.8 10*3/uL (ref 1.7–7.7)
Neutrophils Relative %: 50 %
PLATELETS: 188 10*3/uL (ref 150–400)
RBC: 4 MIL/uL — ABNORMAL LOW (ref 4.22–5.81)
RDW: 15.4 % (ref 11.5–15.5)
WBC: 5.6 10*3/uL (ref 4.0–10.5)
nRBC: 0 % (ref 0.0–0.2)

## 2018-12-17 LAB — COMPREHENSIVE METABOLIC PANEL
ALT: 32 U/L (ref 0–44)
AST: 34 U/L (ref 15–41)
Albumin: 2.9 g/dL — ABNORMAL LOW (ref 3.5–5.0)
Alkaline Phosphatase: 62 U/L (ref 38–126)
Anion gap: 12 (ref 5–15)
BUN: 15 mg/dL (ref 6–20)
CHLORIDE: 106 mmol/L (ref 98–111)
CO2: 22 mmol/L (ref 22–32)
Calcium: 9.2 mg/dL (ref 8.9–10.3)
Creatinine, Ser: 1.32 mg/dL — ABNORMAL HIGH (ref 0.61–1.24)
GFR calc Af Amer: >60 mL/min (ref >60)
GFR calc non Af Amer: >60 mL/min (ref >60)
Glucose, Bld: 103 mg/dL — ABNORMAL HIGH (ref 70–99)
Potassium: 3.5 mmol/L (ref 3.5–5.1)
Sodium: 140 mmol/L (ref 135–145)
Total Bilirubin: 0.6 mg/dL (ref 0.3–1.2)
Total Protein: 5.7 g/dL — ABNORMAL LOW (ref 6.5–8.1)

## 2018-12-17 LAB — MAGNESIUM: Magnesium: 1.5 mg/dL — ABNORMAL LOW (ref 1.7–2.4)

## 2018-12-17 LAB — PHOSPHORUS: Phosphorus: 4.4 mg/dL (ref 2.5–4.6)

## 2018-12-17 MED ORDER — MAGNESIUM SULFATE 2 GM/50ML IV SOLN
2.0000 g | Freq: Once | INTRAVENOUS | Status: AC
  Administered 2018-12-17: 2 g via INTRAVENOUS
  Filled 2018-12-17: qty 50

## 2018-12-17 MED ORDER — SODIUM CHLORIDE 0.9 % IV SOLN
INTRAVENOUS | Status: AC
  Administered 2018-12-17: 09:00:00 via INTRAVENOUS

## 2018-12-17 MED ORDER — POTASSIUM CHLORIDE CRYS ER 20 MEQ PO TBCR
40.0000 meq | EXTENDED_RELEASE_TABLET | Freq: Once | ORAL | Status: AC
  Administered 2018-12-17: 40 meq via ORAL
  Filled 2018-12-17: qty 2

## 2018-12-17 MED ORDER — NICOTINE 21 MG/24HR TD PT24
21.0000 mg | MEDICATED_PATCH | Freq: Every day | TRANSDERMAL | Status: DC
  Administered 2018-12-17: 21 mg via TRANSDERMAL
  Filled 2018-12-17 (×2): qty 1

## 2018-12-17 NOTE — Progress Notes (Addendum)
Sitter discontinued per MD order this evening about 1500, pt slept short time then started to get up to go to bathroom when he awoke. Staff assisted pt to bathroom. Pt requested to sit up in chair, chair alarm pad placed in pt's chair for safety and to remind pt not to get up without assist. Pt's call bell adapted so pt knows which button to push for assist/nurse button. Pt has poor vision and stated unable to see the actual nurse button.  Pt able to feel which button to push for assist. Chair alarm in place. Pt verbalizes understanding and demonstrates how to push nurse button for help. Will continue to remind pt and monitor.

## 2018-12-17 NOTE — Clinical Social Work Note (Signed)
Clinical Social Work Assessment  Patient Details  Name: Douglas Fowler MRN: [id number] Date of Birth: 12-04-1965  Date of referral:  12/17/18               Reason for consult:  Facility Placement                Permission sought to share information with:  Facility Sport and exercise psychologist, Family Supports Permission granted to share information::  Yes, Verbal Permission Granted  Name::     Acupuncturist::  SNF  Relationship::  Wife  Contact Information:     Housing/Transportation Living arrangements for the past 2 months:  Tax adviser of Information:  Medical Team, Spouse Patient Interpreter Needed:  None Criminal Activity/Legal Involvement Pertinent to Current Situation/Hospitalization:  No - Comment as needed Significant Relationships:  Spouse, Adult Children Lives with:  Self, Spouse Do you feel safe going back to the place where you live?  Yes Need for family participation in patient care:  Yes (Comment)  Care giving concerns:  Patient from home with wife, but will need short term rehab at discharge. Patient has substance abuse concerns, as well, but is not oriented enough to engage in discussion.   Social Worker assessment / plan:  CSW met with patient's wife to discuss recommendations for SNF placement. Patient's wife indicated that she works and is unable to be there to care for the patient during the day, so he will need SNF. Wife unfamiliar with facilities, CSW provided a list. Wife to review list, and CSW to send out referral.   Employment status:    Insurance information:  Managed Medicare PT Recommendations:  [facility] / Referral to community resources:  [place]  Patient/Family's Response to care:  Patient's wife agreeable to SNF placement.  Patient/Family's Understanding of and Emotional Response to Diagnosis, Current Treatment, and Prognosis:  Patient's response unable to be assessed due to confusion with his  withdrawals. Patient's wife aware that the patient needs additional support at this time and that she will not be able to provide it. Patient's wife appreciative of CSW assistance.  Emotional Assessment Appearance:  Appears stated age Attitude/Demeanor/Rapport:  Unable to Assess Affect (typically observed):  Unable to Assess Orientation:  Oriented to Self, Oriented to Place, Oriented to  Time Alcohol / Substance use:  Not Applicable Psych involvement (Current and /or in the community):  No (Comment)  Discharge Needs  Concerns to be addressed:  Care Coordination Readmission within the last 30 days:  No Current discharge risk:  Physical Impairment, Dependent with Mobility, Substance Abuse, Cognitively Impaired Barriers to Discharge:  Continued Medical Work up, [place], [place] 12/17/2018, 2:43 PM

## 2018-12-17 NOTE — Progress Notes (Signed)
Occupational Therapy Treatment Patient Details Name: Douglas Fowler MRN: [id number] DOB: [birth year] Today's Date: 12/17/2018    History of present illness Pt is a 53 y/o male transferred from [place] following AMS, fall and seizure. Thought to be secondary to alcohol withdrawal and delirium. CT of head and spine and EEG negative for acute abnormalities. Troponins slightly elevation, however, per notes, likely secondary to demand ischemia. PMH includes CVA with L sided deficits, HTN, CAD and alcoholism.    OT comments  Pt progressing towards acute OT goals. Focus of session was functional mobility within the room and grooming task standing at sink. Pt remains with impaired cognition, weakness, and L visual field deficits (needs cueing to implement compensatory strategies) impacting assist level and safety with ADLs. Pt was able to correctly state year as "2020" with increased time. Disoriented to place. No family present during session. D/c recommendation remains appropriate.     Follow Up Recommendations  SNF;Supervision/Assistance - 24 hour    Equipment Recommendations  None recommended by OT    Recommendations for Other Services      Precautions / Restrictions Precautions Precautions: Fall;Other (comment) Precaution Comments: seziure, left neglect, left hemiparesis Restrictions Weight Bearing Restrictions: No       Mobility Bed Mobility               General bed mobility comments: Pt up in recliner  Transfers Overall transfer level: Needs assistance Equipment used: Rolling walker (2 wheeled) Transfers: Sit to/from Stand Sit to Stand: Min assist         General transfer comment: Min A for lift assist and steadying upon standing.     Balance Overall balance assessment: Needs assistance Sitting-balance support: Bilateral upper extremity supported Sitting balance-Leahy Scale: Fair     Standing balance support: Bilateral upper extremity  supported Standing balance-Leahy Scale: Poor Standing balance comment: Reliant on BUE and external assist for standing balance.                            ADL either performed or assessed with clinical judgement   ADL Overall ADL's : Needs assistance/impaired     Grooming: Oral care;Minimal assistance;Standing Grooming Details (indicate cue type and reason): assist for throughness and due to cognition. Pt initially reaching for baby powder and shaking it onto oral sponge. Provided setup. Pt standing perpendicular to sink despite verbal and tactile cues to orient to front of sink. Decreased problem solving and sequencing.                             Functional mobility during ADLs: Minimal assistance;Moderate assistance;Rolling walker General ADL Comments: Pt completed functional mobility in the room navigating turns to the left with assist to faciliate rw positioning and advance into left visual field. Mostly min A walking with one LOB while taking a step backwards needing mod A to correct. Difficulty with cognitive aspects of grooming task standing at sink. Left side visiual neglect impacting ADLs as well.      Vision   Vision Assessment?: Vision impaired- to be further tested in functional context Additional Comments: Pt with severe L visual field neglect and poor compensation strategies. Items must be presented at midline before pt can make visual and tactile contact with the,.   Perception     Praxis      Cognition Arousal/Alertness: Awake/alert Behavior During Therapy: Flat affect;Impulsive Overall Cognitive Status: Impaired/Different  from baseline Area of Impairment: Orientation;Attention;Memory;Safety/judgement;Problem solving;Awareness                 Orientation Level: Disoriented to;Place;Situation Current Attention Level: Sustained Memory: Decreased short-term memory   Safety/Judgement: Decreased awareness of safety Awareness:  Intellectual Problem Solving: Slow processing          Exercises     Shoulder Instructions       General Comments Safety sitter present in room. Vitals assessed at end of session: BP 131/83, HR 76, O2 on RA 100    Pertinent Vitals/ Pain       Pain Assessment: No/denies pain  Home Living                                          Prior Functioning/Environment              Frequency  Min 2X/week        Progress Toward Goals  OT Goals(current goals can now be found in the care plan section)  Progress towards OT goals: Progressing toward goals  Acute Rehab OT Goals Patient Stated Goal: No goal stated  OT Goal Formulation: With patient Time For Goal Achievement: 12/29/18 Potential to Achieve Goals: Fair ADL Goals Pt Will Perform Grooming: with supervision;with min assist Pt Will Perform Upper Body Bathing: with supervision;sitting Pt Will Perform Lower Body Bathing: with min assist;sit to/from stand Pt Will Perform Upper Body Dressing: with supervision;sitting Pt Will Perform Lower Body Dressing: with min assist;sit to/from stand Pt Will Transfer to Toilet: ambulating;with min assist;bedside commode Pt Will Perform Toileting - Clothing Manipulation and hygiene: with min assist  Plan Discharge plan remains appropriate    Co-evaluation                 AM-PAC OT "6 Clicks" Daily Activity     Outcome Measure   Help from another person eating meals?: A Little Help from another person taking care of personal grooming?: A Little Help from another person toileting, which includes using toliet, bedpan, or urinal?: A Lot Help from another person bathing (including washing, rinsing, drying)?: A Lot Help from another person to put on and taking off regular upper body clothing?: A Little Help from another person to put on and taking off regular lower body clothing?: A Lot 6 Click Score: 15    End of Session Equipment Utilized During Treatment:  Rolling walker  OT Visit Diagnosis: Unsteadiness on feet (R26.81);Other abnormalities of gait and mobility (R26.89);Repeated falls (R29.6);Muscle weakness (generalized) (M62.81);History of falling (Z91.81);Hemiplegia and hemiparesis;Other symptoms and signs involving cognitive function Hemiplegia - Right/Left: Left Hemiplegia - dominant/non-dominant: Non-Dominant Hemiplegia - caused by: Cerebral infarction   Activity Tolerance Patient tolerated treatment well   Patient Left in chair;with call bell/phone within reach;with nursing/sitter in room;Other (comment)(safety sitter present)   Nurse Communication          Time: [phone] OT Time Calculation (min): 15 min  Charges: OT General Charges $OT Visit: 1 Visit OT Treatments $Self Care/Home Management : 8-22 mins  Tyrone Schimke, OT Acute Rehabilitation Services Pager: [phone] Office: [phone]    Hortencia Pilar 12/17/2018, 12:58 PM

## 2018-12-17 NOTE — NC FL2 (Signed)
[place] LEVEL OF CARE SCREENING TOOL     IDENTIFICATION  Patient Name: Douglas Fowler Birthdate: 10/01/1966 Sex: male Admission Date (Current Location): 12/10/2018  [facility] and [place] Number:  [employer] and Address:  [facility] [place], [place] [address] [place], [place] [address]      Provider Number: [id number]  Attending Physician Name and Address:  Kerney Elbe, DO  Relative Name and Phone Number:       Current Level of Care: Hospital Recommended Level of Care: [place] Prior Approval Number:    Date Approved/Denied:   PASRR Number: [id number]  Discharge Plan: SNF    Current Diagnoses: Patient Active Problem List   Diagnosis Date Noted  . Alcohol withdrawal delirium ([place]) 12/11/2018  . Alcohol withdrawal ([place]) 12/11/2018  . Alcohol withdrawal seizure with complication, with unspecified complication ([place]) 30/94/0768  . Essential hypertension 08/05/2018  . CAD (coronary artery disease) 08/05/2018  . Tobacco abuse 08/05/2018  . Hypokalemia   . AKI (acute kidney injury) ([place])   . Acute blood loss anemia   . CVA (cerebral vascular accident) ([place]) 08/04/2018    Orientation RESPIRATION BLADDER Height & Weight     Self, Time, Place  Normal Incontinent, External catheter Weight: 172 lb (78 kg) Height:  5\' 6"  (167.6 cm)  BEHAVIORAL SYMPTOMS/MOOD NEUROLOGICAL BOWEL NUTRITION STATUS    Convulsions/Seizures Continent Diet(see DC summary)  AMBULATORY STATUS COMMUNICATION OF NEEDS Skin   Limited Assist Verbally Normal                       Personal Care Assistance Level of Assistance  Bathing, Feeding, Dressing Bathing Assistance: Limited assistance Feeding assistance: Independent Dressing Assistance: Limited assistance     Functional Limitations Info  Sight, Hearing, Speech Sight Info: Adequate Hearing Info: Adequate Speech Info: Adequate    SPECIAL CARE FACTORS FREQUENCY  PT (By licensed  PT), OT (By licensed OT)     PT Frequency: 5x/wk OT Frequency: 5x/wk            Contractures Contractures Info: Not present    Additional Factors Info  Code Status, Allergies Code Status Info: Full Allergies Info: NKA           Current Medications (12/17/2018):  This is the current hospital active medication list Current Facility-Administered Medications  Medication Dose Route Frequency Provider Last Rate Last Dose  . 0.9 %  sodium chloride infusion   Intravenous Continuous Raiford Noble [place], DO 75 mL/hr at 12/17/18 0840    . acetaminophen (TYLENOL) tablet 650 mg  650 mg Oral Q6H PRN Rise Patience, MD       Or  . acetaminophen (TYLENOL) suppository 650 mg  650 mg Rectal Q6H PRN Rise Patience, MD      . amLODipine (NORVASC) tablet 10 mg  10 mg Oral Daily Oretha Milch D, MD   10 mg at 12/17/18 0842  . carvedilol (COREG) tablet 6.25 mg  6.25 mg Oral BID WC Oretha Milch D, MD   6.25 mg at 12/17/18 0842  . chlorhexidine (PERIDEX) 0.12 % solution 15 mL  15 mL Mouth Rinse BID Rise Patience, MD   15 mL at 12/17/18 0841  . folic acid (FOLVITE) tablet 1 mg  1 mg Oral Daily Oretha Milch D, MD   1 mg at 12/17/18 0842  . heparin injection 5,000 Units  5,000 Units Subcutaneous Q8H Desiree Hane, MD   5,000 Units  at 12/17/18 0842  . hydrALAZINE (APRESOLINE) injection 5 mg  5 mg Intravenous Q4H PRN Rise Patience, MD   5 mg at 12/15/18 1603  . Influenza vac split quadrivalent PF (FLUARIX) injection 0.5 mL  0.5 mL Intramuscular Tomorrow-1000 Rise Patience, MD      . levETIRAcetam (KEPPRA) tablet 500 mg  500 mg Oral BID Oretha Milch D, MD   500 mg at 12/17/18 0841  . LORazepam (ATIVAN) injection 1-4 mg  1-4 mg Intravenous Q1H PRN Rise Patience, MD   2 mg at 12/17/18 0233  . MEDLINE mouth rinse  15 mL Mouth Rinse q12n4p Rise Patience, MD   15 mL at 12/14/18 1849  . multivitamin (PROSIGHT) tablet 1 tablet  1 tablet Oral Daily Oretha Milch D, MD   1 tablet at 12/17/18 [phone]  . ondansetron (ZOFRAN) tablet 4 mg  4 mg Oral Q6H PRN Rise Patience, MD       Or  . ondansetron [facility]) injection 4 mg  4 mg Intravenous Q6H PRN Rise Patience, MD      . pneumococcal 23 valent vaccine (PNU-IMMUNE) injection 0.5 mL  0.5 mL Intramuscular Tomorrow-1000 Rise Patience, MD      . thiamine (VITAMIN B-1) tablet 100 mg  100 mg Oral Daily Oretha Milch D, MD   100 mg at 12/17/18 2811     Discharge Medications: Please see discharge summary for a list of discharge medications.  Relevant Imaging Results:  Relevant Lab Results:   Additional Information SS#: [id number]  Geralynn Ochs, LCSW

## 2018-12-17 NOTE — Progress Notes (Signed)
PROGRESS NOTE    Douglas Fowler  LGX:[id number] DOB: 10-Dec-1965 DOA: 12/10/2018 PCP: Imagene Riches, NP   Brief Narrative:  Douglas Fowler is a 53 y.o. year old male with medical history significant for HTN, hyperlipidemia and chronic alcoholism who presented on 12/10/2018 with altered mental status after unwitnessed fall and was found to have severe alcohol withdrawal prompting transfer from [place] to [place] on 1/9.  Patient's wife provides history stating patient typically drinks 1/5 of hard liquor daily was in his usual state of health the day prior to admission in the morning time when she suddenly found him lying face down on the floor.  When she turned him over there is bleeding where he had bit his tongue.  He was sent to [facility] where he had a witnessed seizure per wife and x-ray holding.  He was then transferred to [facility] for further evaluation  **Improving but unwitnessed fall on 12/14/2018.  Head CT was negative and showed no acute intracranial abnormalities and unchanged multiple bilateral chronic infarcts.  PT OT evaluating and OT recommending skilled nursing facility currently.  Improving from a withdrawal standpoint. Cr slightly elevated so have placed back on IVF.  Assessment & Plan:   Principal Problem:   Alcohol withdrawal delirium (HCC) Active Problems:   CVA (cerebral vascular accident) ([place])   Essential hypertension   CAD (coronary artery disease)   Alcohol withdrawal ([place])   Alcohol withdrawal seizure with complication, with unspecified complication ([place])  Hospital-Acquired Delirium -Patient's mental status at baseline seems consistent with dementia per wife/related to vascular events (CVA), longtime alcoholism.   -At baseline patient is oriented to self, others, time.   -Despite unrevealing with active withdrawal patient has had delirious episodes requiring sitter at bedside for safety.  -C/w Delirium precautions. -Will D/C Safety sitter in anticipation to D/C  to SNF  Unwitnessed Fall, occurred in the hospital overnight on 1/13.   -WasVery drowsy on exam yesterday a.m. but was suspected drowsiness related to Haldol administration -Repeat CT head shows no acute abnormalities. -Closely monitor.   -Continue delirium precautions -PT/OT to evaluate and Treat; PT/OT recommending SNF currently  -Social Work Consulted for assistance with Placement   Seizure, suspect related to alcohol withdrawal, stable -Patient has multiple risk factors for seizures given recent stroke in September, chronic alcoholism and currently going through withdrawal.   -EEG shows no focal activity, CT head with no acute abnormalities,  -CT spine unremarkable.  Has not had recurrent seizure here.   -Discussed case with Neurologist who recommended long-term AED for seizures related to alcohol withdrawal -Will continue Keppra 814 mg po BID, folic and thiamine, continue neurochecks.  Alcohol Withdrawal with Delirium -Continues to improve Daily; Was alert to Self and Place but not Year or Date -No longer requiring scheduled Ativan, continue to monitor CIWA protocol believe we have entered the last days of his withdrawal symptoms given his mental status is closer to baseline.   -He has passed swallow screen tolerating diet.   -Now on oral folate, thiamine and multivitamin supplementation and will continue     History of CVA, has residual weakness on left side -Per wife has not been adherent with his medication regimen.   -C/w Neurochecks  Hypertension, improved control -Tolerating reinitiation of home oral BP medications Amlodipine 10 mg po Daily and Coreg (increased to 6.25 twice daily since admission) -Continue to monitor. -C/w IV Hydralazine PRN -BP this Afternoon was 131/83  Elevated Troponin, resolved -Peak of 0.2 has now  down trended.  Likely demand ischemia in the setting of alcohol withdrawal.  EKG nonischemic.  No chest pain  Transaminitis/Abnormal LFTs -Likely  alcoholic hepatitis, resolved -AST was 34 and ALT was 32 -Continue to monitor CMP   Lactic Acidosis, resolved -Reported lactic acid of 19 at [facility],  -here lactic acid has remained normal.   -Likely elevated in the setting of seizure-like activity.  Hypokalemia -Patient's K+ was 3.4 and improve to 3.5 -Replete with po KCl 40 mEQ Once today  -Continue to Monitor and Replete as Necessary -Repeat CMP in AM   Hypomagnesemia -Related to poor nutritional status from alcohol substance abuse -Patient's Mag Level this AM was 1.5 -Replete with IV Mag Sulfate 2 Grams -Continue to Monitor and Replete as Necessary -Repeat Mag Level in AM   Overweight -Estimated body mass index is 27.76 kg/m as calculated from the following:   Height as of this encounter: 5\' 6"  (1.676 m).   Weight as of this encounter: 78 kg. 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  -BUN/Cr slightly elevated and went from 11/1.23 ->  12/1.22 -> 15/1.32 -Started on IVF with NS at 75 mL/hr x 10 hours -Continue to Monitor and Trend Renal Fxn -Avoid Contrast Dyes and Nephrotoxic Medications if possible -Repeat CMP in AM   DVT prophylaxis: Heparin 5,000 units sq q8h Code Status: FULL CODE Family Communication: Discussed with Wife at Bedside Disposition Plan: SNF   Consultants:   Case was Discussed with Neurology during this Admission by my Colleague   Procedures:    Antimicrobials:  Anti-infectives (From admission, onward)   None     Subjective: Seen and examined at bedside and states he is doing better but did not get any rest or sleep last night. No CP, SOB, Nausea or Vomiting. No Lightheadedness or dizziness. Agreeable to SNF so Education officer, museum notified.   Objective: Vitals:   12/17/18 0755 12/17/18 0758 12/17/18 0850 12/17/18 1158  BP: (!) 148/100 (!) 152/94  131/83  Pulse: 65 67  77 71  Resp: 14 16  15   Temp: 98.5 F (36.9 C)     TempSrc: Oral   Oral  SpO2: 97% 95%  100%  Weight:      Height:        Intake/Output Summary (Last 24 hours) at 12/17/2018 1325 Last data filed at 12/17/2018 0850 Gross per 24 hour  Intake 480 ml  Output -  Net 480 ml   Filed Weights   12/14/18 0421 12/16/18 0300 12/17/18 0500  Weight: 78.1 kg 82.1 kg 78 kg   Examination: Physical Exam:  Constitutional: Well-nourished, well-developed overweight Caucasian male currently no acute distress but appears slightly agitated the morning.  He is improved significantly and no longer withdrawing Eyes: Lids and conjunctive are normal.  Sclera anicteric ENMT: External ears and nose appear normal.  Grossly normal hearing.  Mucous members are moist Neck: Appears supple no JVD Respiratory: Diminished to auscultation bilaterally no appreciable wheezing, rales, rhonchi.  Patient not tachypneic using any accessory muscles to breathe Cardiovascular: Regular rate and rhythm.  No appreciable murmurs, rubs, gallop.  No lower extremity edema noted Abdomen: Soft, nontender, mildly distended.  Bowel sounds present in 4 quadrants GU: Deferred Musculoskeletal: No clubbing or cyanosis.  No joint deformities noted Skin: Skin is warm and dry with no appreciable rashes or lesions on limited skin evaluation Neurologic: Cranial  nerves II through XII grossly intact no appreciable focal deficits.  Romberg sign and cerebellar reflexes were not assessed.  Does not have any existing tremors noted Psychiatric: Normal judgment insight.  Patient is awake alert and oriented x3.  Slightly agitated mood but appropriate affect  Data Reviewed: I have personally reviewed following labs and imaging studies  CBC: Recent Labs  Lab 12/10/18 2351 12/11/18 0707 12/16/18 0823 12/17/18 0623  WBC 6.7 5.6 5.9 5.6  NEUTROABS 4.8  --  3.6 2.8  HGB 13.9 12.8* 12.5* 12.1*  HCT 40.7 38.2* 37.8* 36.6*  MCV 92.7 93.4 91.1 91.5  PLT  150 137* 172 998   Basic Metabolic Panel: Recent Labs  Lab 12/11/18 0707 12/13/18 0608 12/14/18 0624 12/15/18 0435 12/16/18 0823 12/17/18 0623  NA 137 137 136 137 136 140  K 4.0 3.8 3.5 3.6 3.4* 3.5  CL 103 103 104 103 103 106  CO2 22 19* 20* 23 23 22   GLUCOSE 86 74 85 108* 106* 103*  BUN 9 9 8 11 12 15   CREATININE 1.24 1.11 1.07 1.23 1.22 1.32*  CALCIUM 7.6* 8.1* 8.4* 9.2 9.0 9.2  MG 2.2 1.7  --  1.4* 1.9 1.5*  PHOS 2.6 2.5  --   --  3.6 4.4   GFR: Estimated Creatinine Clearance: 62.9 mL/min (A) (by C-G formula based on SCr of 1.32 mg/dL (H)). Liver Function Tests: Recent Labs  Lab 12/10/18 2351 12/13/18 0608 12/14/18 0624 12/16/18 0823 12/17/18 0623  AST 100* 39 41 27 34  ALT 76* 39 38 30 32  ALKPHOS 60 66 66 66 62  BILITOT 1.4* 1.3* 1.4* 0.7 0.6  PROT 6.5 5.6* 5.8* 6.0* 5.7*  ALBUMIN 3.5 3.0* 3.0* 3.1* 2.9*   No results for input(s): LIPASE, AMYLASE in the last 168 hours. Recent Labs  Lab 12/10/18 2351  AMMONIA 24   Coagulation Profile: No results for input(s): INR, PROTIME in the last 168 hours. Cardiac Enzymes: Recent Labs  Lab 12/10/18 2351 12/11/18 0707 12/11/18 1319  CKTOTAL 231  --   --   TROPONINI 0.14* 0.20* 0.19*   BNP (last 3 results) No results for input(s): PROBNP in the last 8760 hours. HbA1C: No results for input(s): HGBA1C in the last 72 hours. CBG: Recent Labs  Lab 12/16/18 0716 12/16/18 1151 12/16/18 2259 12/17/18 0542 12/17/18 1135  GLUCAP 106* 137* 94 112* 121*   Lipid Profile: No results for input(s): CHOL, HDL, LDLCALC, TRIG, CHOLHDL, LDLDIRECT in the last 72 hours. Thyroid Function Tests: No results for input(s): TSH, T4TOTAL, FREET4, T3FREE, THYROIDAB in the last 72 hours. Anemia Panel: No results for input(s): VITAMINB12, FOLATE, FERRITIN, TIBC, IRON, RETICCTPCT in the last 72 hours. Sepsis Labs: Recent Labs  Lab 12/10/18 2351 12/11/18 0354  LATICACIDVEN 1.4 1.0    Recent Results (from the past 240 hour(s))   MRSA PCR Screening     Status: None   Collection Time: 12/11/18  6:48 AM  Result Value Ref Range Status   MRSA by PCR NEGATIVE NEGATIVE Final    Comment:        The GeneXpert MRSA Assay (FDA approved for NASAL specimens only), is one component of a comprehensive MRSA colonization surveillance program. It is not intended to diagnose MRSA infection nor to guide or monitor treatment for MRSA infections. Performed at [facility], [place] [address] [place], [place] [address]     Radiology Studies: No results found. Scheduled Meds: . amLODipine  10 mg Oral Daily  . carvedilol  6.25 mg Oral BID WC  . chlorhexidine  15 mL Mouth Rinse BID  . folic acid  1 mg Oral Daily  . heparin injection (subcutaneous)  5,000 Units Subcutaneous Q8H  . Influenza vac split quadrivalent PF  0.5 mL Intramuscular Tomorrow-1000  . levETIRAcetam  500 mg Oral BID  . mouth rinse  15 mL Mouth Rinse q12n4p  . multivitamin  1 tablet Oral Daily  . pneumococcal 23 valent vaccine  0.5 mL Intramuscular Tomorrow-1000  . thiamine  100 mg Oral Daily   Continuous Infusions: . sodium chloride 75 mL/hr at 12/17/18 0840    LOS: 7 days   Kerney Elbe, DO [facility] PAGER is on [place]  If 7PM-7AM, please contact night-coverage [email or web address] Password [email or web address] 12/17/2018, 1:25 PM

## 2018-12-18 DIAGNOSIS — D649 Anemia, unspecified: Secondary | ICD-10-CM

## 2018-12-18 LAB — GLUCOSE, CAPILLARY: Glucose-Capillary: 106 mg/dL — ABNORMAL HIGH (ref 70–99)

## 2018-12-18 LAB — CBC WITH DIFFERENTIAL/PLATELET
Abs Immature Granulocytes: 0.13 10*3/uL — ABNORMAL HIGH (ref 0.00–0.07)
Basophils Absolute: 0.1 10*3/uL (ref 0.0–0.1)
Basophils Relative: 1 %
Eosinophils Absolute: 0.2 10*3/uL (ref 0.0–0.5)
Eosinophils Relative: 4 %
HEMATOCRIT: 35.3 % — AB (ref 39.0–52.0)
Hemoglobin: 11.7 g/dL — ABNORMAL LOW (ref 13.0–17.0)
Immature Granulocytes: 2 %
Lymphocytes Relative: 26 %
Lymphs Abs: 1.5 10*3/uL (ref 0.7–4.0)
MCH: 30.9 pg (ref 26.0–34.0)
MCHC: 33.1 g/dL (ref 30.0–36.0)
MCV: 93.1 fL (ref 80.0–100.0)
Monocytes Absolute: 0.8 10*3/uL (ref 0.1–1.0)
Monocytes Relative: 15 %
Neutro Abs: 3 10*3/uL (ref 1.7–7.7)
Neutrophils Relative %: 52 %
PLATELETS: 232 10*3/uL (ref 150–400)
RBC: 3.79 MIL/uL — ABNORMAL LOW (ref 4.22–5.81)
RDW: 15.6 % — ABNORMAL HIGH (ref 11.5–15.5)
WBC: 5.7 10*3/uL (ref 4.0–10.5)
nRBC: 0 % (ref 0.0–0.2)

## 2018-12-18 LAB — COMPREHENSIVE METABOLIC PANEL
ALT: 34 U/L (ref 0–44)
AST: 34 U/L (ref 15–41)
Albumin: 2.9 g/dL — ABNORMAL LOW (ref 3.5–5.0)
Alkaline Phosphatase: 59 U/L (ref 38–126)
Anion gap: 8 (ref 5–15)
BUN: 14 mg/dL (ref 6–20)
CO2: 22 mmol/L (ref 22–32)
Calcium: 8.7 mg/dL — ABNORMAL LOW (ref 8.9–10.3)
Chloride: 107 mmol/L (ref 98–111)
Creatinine, Ser: 1.33 mg/dL — ABNORMAL HIGH (ref 0.61–1.24)
GFR calc Af Amer: >60 mL/min (ref >60)
GFR calc non Af Amer: >60 mL/min (ref >60)
GLUCOSE: 104 mg/dL — AB (ref 70–99)
Potassium: 4.1 mmol/L (ref 3.5–5.1)
Sodium: 137 mmol/L (ref 135–145)
Total Bilirubin: 0.6 mg/dL (ref 0.3–1.2)
Total Protein: 5.5 g/dL — ABNORMAL LOW (ref 6.5–8.1)

## 2018-12-18 LAB — MAGNESIUM: Magnesium: 1.6 mg/dL — ABNORMAL LOW (ref 1.7–2.4)

## 2018-12-18 LAB — PHOSPHORUS: Phosphorus: 4 mg/dL (ref 2.5–4.6)

## 2018-12-18 MED ORDER — THIAMINE HCL 100 MG PO TABS: 100.0000 mg | ORAL_TABLET | Freq: Every day | ORAL | 0 refills | Status: DC

## 2018-12-18 MED ORDER — MAGNESIUM SULFATE 2 GM/50ML IV SOLN
2.0000 g | Freq: Once | INTRAVENOUS | Status: DC
  Filled 2018-12-18: qty 50

## 2018-12-18 MED ORDER — AMLODIPINE BESYLATE 10 MG PO TABS: 10.0000 mg | ORAL_TABLET | Freq: Every day | ORAL | 0 refills | Status: DC

## 2018-12-18 MED ORDER — PROSIGHT PO TABS: 1.0000 | ORAL_TABLET | Freq: Every day | ORAL | 0 refills | Status: DC

## 2018-12-18 MED ORDER — ATORVASTATIN CALCIUM 80 MG PO TABS: 80.0000 mg | ORAL_TABLET | Freq: Every day | ORAL | 0 refills | Status: DC

## 2018-12-18 MED ORDER — MAGNESIUM OXIDE 400 (241.3 MG) MG PO TABS: 400.0000 mg | ORAL_TABLET | Freq: Two times a day (BID) | ORAL | 0 refills | Status: DC

## 2018-12-18 MED ORDER — LEVETIRACETAM 500 MG PO TABS: 500.0000 mg | ORAL_TABLET | Freq: Two times a day (BID) | ORAL | 0 refills | Status: DC

## 2018-12-18 MED ORDER — NICOTINE 21 MG/24HR TD PT24: 21.0000 mg | MEDICATED_PATCH | Freq: Every day | TRANSDERMAL | 0 refills | Status: DC

## 2018-12-18 MED ORDER — ASPIRIN 325 MG PO TABS: 325.0000 mg | ORAL_TABLET | Freq: Every day | ORAL | 0 refills | Status: DC

## 2018-12-18 MED ORDER — HYDROXYZINE HCL 10 MG PO TABS: 10.0000 mg | ORAL_TABLET | Freq: Three times a day (TID) | ORAL | 0 refills | Status: DC | PRN

## 2018-12-18 MED ORDER — MAGNESIUM OXIDE 400 (241.3 MG) MG PO TABS
400.0000 mg | ORAL_TABLET | Freq: Two times a day (BID) | ORAL | Status: DC
  Administered 2018-12-18: 400 mg via ORAL
  Filled 2018-12-18: qty 1

## 2018-12-18 MED ORDER — FOLIC ACID 1 MG PO TABS: 1.0000 mg | ORAL_TABLET | Freq: Every day | ORAL | 0 refills | Status: DC

## 2018-12-18 MED ORDER — CARVEDILOL 6.25 MG PO TABS: 6.2500 mg | ORAL_TABLET | Freq: Two times a day (BID) | ORAL | 0 refills | Status: DC

## 2018-12-18 NOTE — Care Management Note (Signed)
Case Management Note  Patient Details  Name: Douglas Fowler MRN: [id number] Date of Birth: [birth year]  Subjective/Objective:     Pt admitted with alcohol withdrawal. He is from home with his wife that works.  No issues obtaining meds and no issues with transportation.              Action/Plan: Pt discharging home with [facility] services. Recommendations are for SNF but pt is refusing and wife in agreement. They want to d/c home with [facility] services. Wife was able to change her shift at work and a friend will stay with the patient at their home while she is working. CM provided the wife choice of [place] agencies and she selected [facility]. Cassie with [facility] notified and accepted the referral. Pt with orders for walker. James with [facility] DME notified and will deliver to the room. Wife to provide transport home.  Expected Discharge Date:  12/18/18               Expected Discharge Plan:  [place]  In-House Referral:     Discharge planning Services  CM Consult  Post Acute Care Choice:  Home Health, Durable Medical Equipment Choice offered to:  Patient, Spouse  DME Arranged:  Walker rolling DME Agency:  [place]:  RN, PT, OT, Nurse's Aide, Social Work [employer] Agency:  [facility]  Status of Service:  Completed, signed off  If discussed at [employer], dates discussed:    Additional Comments:  Pollie Friar, RN 12/18/2018, 12:10 PM

## 2018-12-18 NOTE — Progress Notes (Signed)
Physical Therapy Treatment Patient Details Name: Douglas Fowler MRN: [id number] DOB: 1966-09-25 Today's Date: 12/18/2018    History of Present Illness Pt is a 53 y/o male transferred from [place] following AMS, fall and seizure. Thought to be secondary to alcohol withdrawal and delirium. CT of head and spine and EEG negative for acute abnormalities. Troponins slightly elevation, however, per notes, likely secondary to demand ischemia. PMH includes CVA with L sided deficits, HTN, CAD and alcoholism.     PT Comments    Pt performed gait training and PTA assessed for need for AD at home.  Pt remains to require min assistance and still requires 24 hour supervision and support.  Pt continues to benefit from short term SNF stay to allow for rehab to improve strength, function and compensatory strategies before returning home.  At this time patient is declining placement and will benefit from HHPT and issued RW for home use.  Wife present and reports she is moving to first shift and will have someone to watch him during the day.  Plan today per chart is d/c home to continue PT with [facility] services.     Follow Up Recommendations  SNF;Supervision/Assistance - 24 hour(Pt is refusing SNF and will require HHPT)     Equipment Recommendations  Rolling walker with 5" wheels    Recommendations for Other Services       Precautions / Restrictions Precautions Precautions: Fall;Other (comment) Precaution Comments: seziure, left neglect, left hemiparesis Restrictions Weight Bearing Restrictions: No    Mobility  Bed Mobility               General bed mobility comments: Pt seated in chair on arrival attempting to stand despite cues to remain seated.    Transfers Overall transfer level: Needs assistance Equipment used: None Transfers: Sit to/from Stand Sit to Stand: Min guard         General transfer comment: Cues for safety.  No overt LOB in standing.     Ambulation/Gait Ambulation/Gait assistance: Min assist Gait Distance (Feet): 40 Feet Assistive device: Rolling walker (2 wheeled) Gait Pattern/deviations: Step-through pattern;Trunk flexed Gait velocity: Decreased    General Gait Details: Pt presents with buckling in stance phase on L side required min assistance to correct.  Min assist to negotiate obstacles in halls and in room on L side.     Stairs             Wheelchair Mobility    Modified Rankin (Stroke Patients Only)       Balance     Sitting balance-Leahy Scale: Good       Standing balance-Leahy Scale: Poor Standing balance comment: reliant on device with buckling.                              Cognition Arousal/Alertness: Awake/alert Behavior During Therapy: Flat affect;Impulsive Overall Cognitive Status: Impaired/Different from baseline Area of Impairment: Orientation;Attention;Memory;Safety/judgement;Problem solving;Awareness                 Orientation Level: Disoriented to;Place;Situation Current Attention Level: Sustained Memory: Decreased short-term memory   Safety/Judgement: Decreased awareness of safety Awareness: Intellectual Problem Solving: Slow processing General Comments: Pt not oriented to place, time, or situation.  He has history of cognitive deficits from recent CVA.  Pt unable to find his way out of his room with door closed.  required max VCs to problem solve where his body position should be to open the  door.  He reports, " it's a trick door."      Exercises      General Comments        Pertinent Vitals/Pain Pain Assessment: No/denies pain    Home Living                      Prior Function            PT Goals (current goals can now be found in the care plan section) Acute Rehab PT Goals Patient Stated Goal: No goal stated  Potential to Achieve Goals: Fair Progress towards PT goals: Progressing toward goals    Frequency    Min  2X/week      PT Plan Current plan remains appropriate    Co-evaluation              AM-PAC PT "6 Clicks" Mobility   Outcome Measure  Help needed turning from your back to your side while in a flat bed without using bedrails?: A Little Help needed moving from lying on your back to sitting on the side of a flat bed without using bedrails?: A Little Help needed moving to and from a bed to a chair (including a wheelchair)?: A Lot Help needed standing up from a chair using your arms (e.g., wheelchair or bedside chair)?: A Lot Help needed to walk in hospital room?: A Lot Help needed climbing 3-5 steps with a railing? : Total 6 Click Score: 13    End of Session Equipment Utilized During Treatment: Gait belt Activity Tolerance: Patient tolerated treatment well Patient left: in bed;with call bell/phone within reach;with nursing/sitter in room Nurse Communication: Mobility status PT Visit Diagnosis: Muscle weakness (generalized) (M62.81);History of falling (Z91.81);Other symptoms and signs involving the nervous system (R29.898);Other abnormalities of gait and mobility (R26.89)     Time: 1324-4010 PT Time Calculation (min) (ACUTE ONLY): 11 min  Charges:  $Gait Training: 8-22 mins                     Douglas Fowler, PTA Acute Rehabilitation Services Pager [phone] Office [phone]     Douglas Fowler Douglas Fowler 12/18/2018, 11:54 AM

## 2018-12-18 NOTE — Progress Notes (Signed)
Discharge instructions and teaching given to pt wife. No new questions or concerns. Tele removed.  Wife to transport pt home, staff d/c from unit in wheelchair. Walker given to wife.

## 2018-12-18 NOTE — Discharge Summary (Signed)
Physician Discharge Summary  Douglas Fowler ZGY:[id number] DOB: January 10, 1966 DOA: 12/10/2018  PCP: Imagene Riches, NP  Admit date: 12/10/2018 Discharge date: 12/18/2018  Admitted From: Home Disposition: Home with [place] (PT/OT/RN/Aide/SW) as pateint and wife declined SNF  Recommendations for Outpatient Follow-up:  1. Follow up with PCP in 1-2 weeks 2. Please obtain CMP/CBC, Mag, Phos in one week 3. Please follow up on the following pending results:  Home Health: Yes  Equipment/Devices: Conservation officer, nature with 5" Wheels  Discharge Condition: Stable  CODE STATUS: FULL CODE Diet recommendation: Heart Healthy Diet  Brief/Interim Summary: Brief Narrative:  Douglas Fowler a 53 y.o.year old malewith medical history significant for HTN, hyperlipidemia and chronic alcoholism who presented on 1/9/2020with altered mental status after unwitnessed fall and was found to have severe alcohol withdrawal prompting transfer from [place] to [place] on 1/9. Patient's wife provides history stating patient typically drinks 1/5 of hard liquor daily was in his usual state of health the day prior to admission in the morning time when she suddenly found him lying face down on the floor. When she turned him over there is bleeding where he had bit his tongue. He was sent to [facility] where he had a witnessed seizure per wife and x-ray holding. He was then transferred to [facility] for further evaluation  **Improving but unwitnessed fall on 12/14/2018.  Head CT was negative and showed no acute intracranial abnormalities and unchanged multiple bilateral chronic infarcts.  PT OT evaluating and recommending skilled nursing facility currently.  Improving from a withdrawal standpoint. Cr slightly elevated so have placed back on IVF but has only 1 kidney and Cr is close to baseline. Patient was to go to SNF but wife and patient refused today so will go home with [facility] with close PCP follow up  within 1 week. He was advised to stay away from EtOH and he was understandable and agreeable.  Discharge Diagnoses:  Principal Problem:   Alcohol withdrawal delirium ([place]) Active Problems:   CVA (cerebral vascular accident) ([place])   Essential hypertension   CAD (coronary artery disease)   Alcohol withdrawal ([place])   Alcohol withdrawal seizure with complication, with unspecified complication ([place])  Hospital-Acquired Delirium -Patient's mental status at baseline seems consistent with dementia per wife/related to vascular events (CVA), longtime alcoholism.  -At baseline patient is oriented to self, others, time.  -Despite unrevealing with active withdrawal patient has had delirious episodes requiring sitter at bedside for safety.  -C/w Delirium precautions. -Will D/C Safety sitter in anticipation to D/C to SNF but he refused SNF so will go Home with Home Health to a familiar environment   Unwitnessed Fall, occurred in the hospital overnight on 1/13.  -WasVery drowsy on exam yesterday a.m. but was suspected drowsiness related to Haldol administration -Repeat CT head shows no acute abnormalities. -Closely monitor.  -Continue delirium precautions -PT/OT to evaluate and Treat; PT/OT recommending SNF currently  -Social Work Consulted for assistance with Placement but patient and family refused so will go home with Home Health  Seizure, suspect related to alcohol withdrawal, stable -Patient has multiple risk factors for seizures given recent stroke in September, chronic alcoholism and currently going through withdrawal.  -EEG shows no focal activity, CT head with no acute abnormalities,  -CT spine unremarkable. Has not had recurrent seizure here.  -Discussed case with Neurologist who recommended long-term AED for seizures related to alcohol withdrawal -Will continue Keppra 591 mg po BID, folic and thiamine,continue neurochecks. -Follow up  with Neurology in the outpatient setting    Alcohol Withdrawal with Delirium -Continues to improve Daily; Was alert to Self and Place but not Year or Date -No longer requiring scheduled Ativan, continue to monitor CIWA protocol believe we have entered the last days of his withdrawal symptoms given his mental status is closer to baseline.  -He has passed swallow screen tolerating diet.  -Now on oralfolate, thiamine andmultivitamin supplementation and will continue at D/C -Recommended to avoid Alcohol   History of CVA, has residual weakness on left side -Per wife has not been adherent with his medication regimen.  -C/w Neurochecks -Follow up with Neurology as an outpatient   Hypertension, improved control -Tolerating reinitiation of home oral BP medications Amlodipine 10 mg po Daily and Coreg(increased to 6.25 twice daily since admission) -Continue to monitor. -C/w IV Hydralazine PRN -BP this AM was 116/80  Elevated Troponin, resolved -Peak of 0.2 has now down trended. Likely demand ischemia in the setting of alcohol withdrawal. EKG nonischemic. No chest pain  Transaminitis/Abnormal LFTs -Likely alcoholic hepatitis, resolved -AST was 34 and ALT was 34 -Continue to monitor CMP   Lactic Acidosis, resolved -Reported lactic acid of 19 at [facility],  -here lactic acid has remained normal.  -Likely elevated in the setting of seizure-like activity.  Hypokalemia -Patient's K+ improved to 4.1 -Continue to Monitor and Replete as Necessary -Repeat CMP in AM   Hypomagnesemia -Related to poor nutritional status from alcohol substance abuse -Patient's Mag Level this AM was 1.6 -Replete with po Mag Oxide 400 mg po BID x3 days  -Continue to Monitor and Replete as Necessary -Repeat Mag Level in AM   Overweight -Estimated body mass index is 30.18 kg/m as calculated from the following:   Height as of this encounter: 5\' 6"  (1.676 m).   Weight as of this encounter: 84.8 kg. -Weight Loss Counseling Given     Hx of Renal Nephrectomy/Mild Renal Insufficiency/CKD Stage 2 -Per Wife has only one kidney as the other one was removed due to Cancer  -BUN/Cr slightly elevated and went from 11/1.23 ->  12/1.22 -> 15/1.32 -> 14/1.33 -Started on IVF with NS at 75 mL/hr x 10 hours and stopped  -Continue to Monitor and Trend Renal Fxn as an outpatient  -Avoid Contrast Dyes and Nephrotoxic Medications if possible -Repeat CMP as an outpatient   Normocytic Anemia/Anemia of Chronic Disease -Patient's Hb/Hct went from 12.5/37.8 -> 12.1/36.6 -> 11.7/35.3 -Continue to [place] for S/Sx of Bleeding -Repeat CBC as an outpatient   Discharge Instructions  Discharge Instructions    Call MD for:  difficulty breathing, headache or visual disturbances   Complete by:  As directed    Call MD for:  extreme fatigue   Complete by:  As directed    Call MD for:  hives   Complete by:  As directed    Call MD for:  persistant dizziness or light-headedness   Complete by:  As directed    Call MD for:  persistant nausea and vomiting   Complete by:  As directed    Call MD for:  redness, tenderness, or signs of infection (pain, swelling, redness, odor or green/yellow discharge around incision site)   Complete by:  As directed    Call MD for:  severe uncontrolled pain   Complete by:  As directed    Call MD for:  temperature >100.4   Complete by:  As directed    Diet - low sodium heart healthy   Complete by:  As directed    Discharge instructions   Complete by:  As directed    You were cared for by a hospitalist during your hospital stay. If you have any questions about your discharge medications or the care you received while you were in the hospital after you are discharged, you can call the unit and ask to speak with the hospitalist on call if the hospitalist that took care of you is not available. Once you are discharged, your primary care physician will handle any further medical issues. Please note that NO REFILLS for  any discharge medications will be authorized once you are discharged, as it is imperative that you return to your primary care physician (or establish a relationship with a primary care physician if you do not have one) for your aftercare needs so that they can reassess your need for medications and monitor your lab values.  Follow up with PCP and Neurology as an outpatient. Take all medications as prescribed and avoid alcohol. If symptoms change or worsen please return to the ED for evaluation   Increase activity slowly   Complete by:  As directed      Allergies as of 12/18/2018   No Known Allergies     Medication List    STOP taking these medications   acetaminophen 500 MG tablet Commonly known as:  TYLENOL   GOODY HEADACHE PO   nicotine 14 mg/24hr patch Commonly known as:  NICODERM CQ - dosed in mg/24 hours Replaced by:  nicotine 21 mg/24hr patch     TAKE these medications   amLODipine 10 MG tablet Commonly known as:  NORVASC Take 1 tablet (10 mg total) by mouth daily.   aspirin 325 MG tablet Take 1 tablet (325 mg total) by mouth daily.   atorvastatin 80 MG tablet Commonly known as:  LIPITOR Take 1 tablet (80 mg total) by mouth daily at 6 PM.   carvedilol 6.25 MG tablet Commonly known as:  COREG Take 1 tablet (6.25 mg total) by mouth 2 (two) times daily with a meal. What changed:    medication strength  how much to take   folic acid 1 MG tablet Commonly known as:  FOLVITE Take 1 tablet (1 mg total) by mouth daily. Start taking on:  December 19, 2018   hydrOXYzine 10 MG tablet Commonly known as:  ATARAX/VISTARIL Take 1 tablet (10 mg total) by mouth 3 (three) times daily as needed for anxiety.   levETIRAcetam 500 MG tablet Commonly known as:  KEPPRA Take 1 tablet (500 mg total) by mouth 2 (two) times daily.   magnesium oxide 400 (241.3 Mg) MG tablet Commonly known as:  MAG-OX Take 1 tablet (400 mg total) by mouth 2 (two) times daily.   multivitamin Tabs  tablet Take 1 tablet by mouth daily. Start taking on:  December 19, 2018   nicotine 21 mg/24hr patch Commonly known as:  NICODERM CQ - dosed in mg/24 hours Place 1 patch (21 mg total) onto the skin daily. Replaces:  nicotine 14 mg/24hr patch   thiamine 100 MG tablet Take 1 tablet (100 mg total) by mouth daily. Start taking on:  December 19, 2018            Durable Medical Equipment  (From admission, onward)         Start     Ordered   12/18/18 1126  For home use only DME Walker rolling  Once    Question:  Patient needs a walker to treat  with the following condition  Answer:  Alcohol withdrawal syndrome ([place])   12/18/18 1126          No Known Allergies  Consultations:  None  Procedures/Studies: Ct Head Wo Contrast  Result Date: 12/15/2018 CLINICAL DATA:  Unwitnessed fall last night. Increased drowsiness today. History of stroke. EXAM: CT HEAD WITHOUT CONTRAST TECHNIQUE: Contiguous axial images were obtained from the base of the skull through the vertex without intravenous contrast. COMPARISON:  CT head dated December 10, 2018. FINDINGS: Brain: No evidence of acute infarction, hemorrhage, hydrocephalus, extra-axial collection or mass lesion/mass effect. Chronic infarcts involving the right frontal, right parietal, right occipital, and left parietooccipital lobes as well as the left internal capsule and left basal ganglia are unchanged. Stable mild atrophy. Vascular: Atherosclerotic vascular calcification of the carotid siphons. No hyperdense vessel. Skull: Negative for fracture or focal lesion. Sinuses/Orbits: No acute finding. Other: None. IMPRESSION: 1.  No acute intracranial abnormality. 2. Unchanged multiple bilateral chronic infarcts. Electronically Signed   By: Titus Dubin M.D.   On: 12/15/2018 12:29    Subjective: Examined at bedside was doing better.  Denies any chest pain, lightheadedness or dizziness.  Decided not to go to skilled nursing facility and wants to go  home.  No other concerns or complaints at this time and he is stable for discharge.  Discharge Exam: Vitals:   12/18/18 1020 12/18/18 1050  BP: 113/80 116/80  Pulse:    Resp: 15 13  Temp:    SpO2:     Vitals:   12/18/18 0309 12/18/18 0920 12/18/18 1020 12/18/18 1050  BP: (!) 156/77 (!) 183/104 113/80 116/80  Pulse: 67     Resp: 15 18 15 13   Temp: 97.9 F (36.6 C) 98 F (36.7 C)    TempSrc: Axillary Oral    SpO2: 97% 98%    Weight: 84.8 kg     Height:       General: Pt is alert, awake, not in acute distress Cardiovascular: RRR, S1/S2 +, no rubs, no gallops Respiratory: Diminished bilaterally, no wheezing, no rhonchi Abdominal: Soft, NT, Distended slightly 2/2 body habitus, bowel sounds + Extremities: no edema, no cyanosis  The results of significant diagnostics from this hospitalization (including imaging, microbiology, ancillary and laboratory) are listed below for reference.    Microbiology: Recent Results (from the past 240 hour(s))  MRSA PCR Screening     Status: None   Collection Time: 12/11/18  6:48 AM  Result Value Ref Range Status   MRSA by PCR NEGATIVE NEGATIVE Final    Comment:        The GeneXpert MRSA Assay (FDA approved for NASAL specimens only), is one component of a comprehensive MRSA colonization surveillance program. It is not intended to diagnose MRSA infection nor to guide or monitor treatment for MRSA infections. Performed at [facility], [place] [address], [place], [place] [address]     Labs: BNP (last 3 results) No results for input(s): BNP in the last 8760 hours. Basic Metabolic Panel: Recent Labs  Lab 12/13/18 0608 12/14/18 0624 12/15/18 0435 12/16/18 0823 12/17/18 0623 12/18/18 0639  NA 137 136 137 136 140 137  K 3.8 3.5 3.6 3.4* 3.5 4.1  CL 103 104 103 103 106 107  CO2 19* 20* 23 23 22 22   GLUCOSE 74 85 108* 106* 103* 104*  BUN 9 8 11 12 15 14   CREATININE 1.11 1.07 1.23 1.22 1.32* 1.33*  CALCIUM 8.1* 8.4* 9.2 9.0 9.2  8.7*  MG  1.7  --  1.4* 1.9 1.5* 1.6*  PHOS 2.5  --   --  3.6 4.4 4.0   Liver Function Tests: Recent Labs  Lab 12/13/18 0608 12/14/18 0624 12/16/18 0823 12/17/18 0623 12/18/18 0639  AST 39 41 27 34 34  ALT 39 38 30 32 34  ALKPHOS 66 66 66 62 59  BILITOT 1.3* 1.4* 0.7 0.6 0.6  PROT 5.6* 5.8* 6.0* 5.7* 5.5*  ALBUMIN 3.0* 3.0* 3.1* 2.9* 2.9*   No results for input(s): LIPASE, AMYLASE in the last 168 hours. No results for input(s): AMMONIA in the last 168 hours. CBC: Recent Labs  Lab 12/16/18 0823 12/17/18 0623 12/18/18 0639  WBC 5.9 5.6 5.7  NEUTROABS 3.6 2.8 3.0  HGB 12.5* 12.1* 11.7*  HCT 37.8* 36.6* 35.3*  MCV 91.1 91.5 93.1  PLT 172 188 232   Cardiac Enzymes: No results for input(s): CKTOTAL, CKMB, CKMBINDEX, TROPONINI in the last 168 hours. BNP: Invalid input(s): POCBNP CBG: Recent Labs  Lab 12/17/18 0542 12/17/18 1135 12/17/18 1602 12/17/18 2319 12/18/18 0627  GLUCAP 112* 121* 132* 111* 106*   D-Dimer No results for input(s): DDIMER in the last 72 hours. Hgb A1c No results for input(s): HGBA1C in the last 72 hours. Lipid Profile No results for input(s): CHOL, HDL, LDLCALC, TRIG, CHOLHDL, LDLDIRECT in the last 72 hours. Thyroid function studies No results for input(s): TSH, T4TOTAL, T3FREE, THYROIDAB in the last 72 hours.  Invalid input(s): FREET3 Anemia work up No results for input(s): VITAMINB12, FOLATE, FERRITIN, TIBC, IRON, RETICCTPCT in the last 72 hours. Urinalysis No results found for: COLORURINE, APPEARANCEUR, [place], [place], [place], [place], [place], [place], PROTEINUR, UROBILINOGEN, NITRITE, LEUKOCYTESUR Sepsis Labs Invalid input(s): PROCALCITONIN,  WBC,  LACTICIDVEN Microbiology Recent Results (from the past 240 hour(s))  MRSA PCR Screening     Status: None   Collection Time: 12/11/18  6:48 AM  Result Value Ref Range Status   MRSA by PCR NEGATIVE NEGATIVE Final    Comment:        The GeneXpert MRSA Assay (FDA approved for  NASAL specimens only), is one component of a comprehensive MRSA colonization surveillance program. It is not intended to diagnose MRSA infection nor to guide or monitor treatment for MRSA infections. Performed at [facility], [place] [address] [place], [place] [address]    Time coordinating discharge: 35 minutes  SIGNED:  Kerney Elbe, DO [facility] 12/18/2018, 4:20 PM Pager is on [place]  If 7PM-7AM, please contact night-coverage [email or web address] Password [email or web address]

## 2018-12-18 NOTE — Progress Notes (Signed)
CSW alerted by MD that patient and wife are now refusing SNF, he will go home with home health.  CSW signing off.  Laveda Abbe, [place] Clinical Social Worker [phone]

## 2019-02-28 ENCOUNTER — Emergency Department (HOSPITAL_COMMUNITY): Payer: Medicare HMO

## 2019-02-28 ENCOUNTER — Inpatient Hospital Stay (HOSPITAL_COMMUNITY)
Admission: EM | Admit: 2019-02-28 | Discharge: 2019-03-05 | DRG: 064 | Disposition: A | Discharge status: Skilled Nursing Facility | Payer: Medicare HMO | Attending: Internal Medicine | Admitting: Internal Medicine

## 2019-02-28 ENCOUNTER — Encounter (HOSPITAL_COMMUNITY): Payer: Self-pay | Admitting: Emergency Medicine

## 2019-02-28 ENCOUNTER — Other Ambulatory Visit: Payer: Self-pay

## 2019-02-28 DIAGNOSIS — I69354 Hemiplegia and hemiparesis following cerebral infarction affecting left non-dominant side: Secondary | ICD-10-CM

## 2019-02-28 DIAGNOSIS — Z7982 Long term (current) use of aspirin: Secondary | ICD-10-CM

## 2019-02-28 DIAGNOSIS — R61 Generalized hyperhidrosis: Secondary | ICD-10-CM

## 2019-02-28 DIAGNOSIS — E785 Hyperlipidemia, unspecified: Secondary | ICD-10-CM | POA: Diagnosis not present

## 2019-02-28 DIAGNOSIS — I6340 Cerebral infarction due to embolism of unspecified cerebral artery: Principal | ICD-10-CM | POA: Diagnosis present

## 2019-02-28 DIAGNOSIS — Z79899 Other long term (current) drug therapy: Secondary | ICD-10-CM

## 2019-02-28 DIAGNOSIS — H47619 Cortical blindness, unspecified side of brain: Secondary | ICD-10-CM | POA: Diagnosis present

## 2019-02-28 DIAGNOSIS — N390 Urinary tract infection, site not specified: Secondary | ICD-10-CM | POA: Diagnosis present

## 2019-02-28 DIAGNOSIS — I639 Cerebral infarction, unspecified: Secondary | ICD-10-CM

## 2019-02-28 DIAGNOSIS — N179 Acute kidney failure, unspecified: Secondary | ICD-10-CM | POA: Diagnosis not present

## 2019-02-28 DIAGNOSIS — G9340 Encephalopathy, unspecified: Secondary | ICD-10-CM | POA: Diagnosis present

## 2019-02-28 DIAGNOSIS — F1010 Alcohol abuse, uncomplicated: Secondary | ICD-10-CM | POA: Diagnosis not present

## 2019-02-28 DIAGNOSIS — H53462 Homonymous bilateral field defects, left side: Secondary | ICD-10-CM | POA: Diagnosis present

## 2019-02-28 DIAGNOSIS — R778 Other specified abnormalities of plasma proteins: Secondary | ICD-10-CM | POA: Diagnosis present

## 2019-02-28 DIAGNOSIS — R4182 Altered mental status, unspecified: Secondary | ICD-10-CM | POA: Diagnosis not present

## 2019-02-28 DIAGNOSIS — F10239 Alcohol dependence with withdrawal, unspecified: Secondary | ICD-10-CM | POA: Diagnosis present

## 2019-02-28 DIAGNOSIS — I248 Other forms of acute ischemic heart disease: Secondary | ICD-10-CM | POA: Diagnosis present

## 2019-02-28 DIAGNOSIS — I10 Essential (primary) hypertension: Secondary | ICD-10-CM | POA: Diagnosis present

## 2019-02-28 DIAGNOSIS — G40909 Epilepsy, unspecified, not intractable, without status epilepticus: Secondary | ICD-10-CM | POA: Diagnosis present

## 2019-02-28 DIAGNOSIS — I160 Hypertensive urgency: Secondary | ICD-10-CM | POA: Diagnosis present

## 2019-02-28 DIAGNOSIS — Z955 Presence of coronary angioplasty implant and graft: Secondary | ICD-10-CM

## 2019-02-28 DIAGNOSIS — R404 Transient alteration of awareness: Secondary | ICD-10-CM

## 2019-02-28 DIAGNOSIS — I69398 Other sequelae of cerebral infarction: Secondary | ICD-10-CM

## 2019-02-28 DIAGNOSIS — I6300 Cerebral infarction due to thrombosis of unspecified precerebral artery: Secondary | ICD-10-CM

## 2019-02-28 DIAGNOSIS — G9389 Other specified disorders of brain: Secondary | ICD-10-CM | POA: Diagnosis present

## 2019-02-28 DIAGNOSIS — R9431 Abnormal electrocardiogram [ECG] [EKG]: Secondary | ICD-10-CM

## 2019-02-28 DIAGNOSIS — F17210 Nicotine dependence, cigarettes, uncomplicated: Secondary | ICD-10-CM | POA: Diagnosis present

## 2019-02-28 DIAGNOSIS — R29810 Facial weakness: Secondary | ICD-10-CM | POA: Diagnosis present

## 2019-02-28 DIAGNOSIS — R7989 Other specified abnormal findings of blood chemistry: Secondary | ICD-10-CM | POA: Diagnosis not present

## 2019-02-28 DIAGNOSIS — R4781 Slurred speech: Secondary | ICD-10-CM | POA: Diagnosis present

## 2019-02-28 DIAGNOSIS — G9341 Metabolic encephalopathy: Secondary | ICD-10-CM | POA: Diagnosis present

## 2019-02-28 DIAGNOSIS — Z9181 History of falling: Secondary | ICD-10-CM

## 2019-02-28 DIAGNOSIS — I2510 Atherosclerotic heart disease of native coronary artery without angina pectoris: Secondary | ICD-10-CM | POA: Diagnosis present

## 2019-02-28 HISTORY — DX: Unspecified convulsions: R56.9

## 2019-02-28 HISTORY — DX: Cerebral infarction, unspecified: I63.9

## 2019-02-28 HISTORY — DX: Tobacco use: Z72.0

## 2019-02-28 HISTORY — DX: Atherosclerotic heart disease of native coronary artery without angina pectoris: I25.10

## 2019-02-28 HISTORY — DX: Alcohol abuse, uncomplicated: F10.10

## 2019-02-28 LAB — COMPREHENSIVE METABOLIC PANEL
ALT: 11 U/L (ref 0–44)
AST: 24 U/L (ref 15–41)
Albumin: 3.5 g/dL (ref 3.5–5.0)
Alkaline Phosphatase: 98 U/L (ref 38–126)
Anion gap: 12 (ref 5–15)
BUN: 15 mg/dL (ref 6–20)
CHLORIDE: 103 mmol/L (ref 98–111)
CO2: 24 mmol/L (ref 22–32)
CREATININE: 1.62 mg/dL — AB (ref 0.61–1.24)
Calcium: 9.5 mg/dL (ref 8.9–10.3)
GFR calc Af Amer: 55 mL/min — ABNORMAL LOW (ref >60)
GFR calc non Af Amer: 47 mL/min — ABNORMAL LOW (ref >60)
Glucose, Bld: 103 mg/dL — ABNORMAL HIGH (ref 70–99)
Potassium: 3.8 mmol/L (ref 3.5–5.1)
Sodium: 139 mmol/L (ref 135–145)
Total Bilirubin: 0.9 mg/dL (ref 0.3–1.2)
Total Protein: 6.7 g/dL (ref 6.5–8.1)

## 2019-02-28 LAB — CBC WITH DIFFERENTIAL/PLATELET
Abs Immature Granulocytes: 0.07 10*3/uL (ref 0.00–0.07)
Basophils Absolute: 0.1 10*3/uL (ref 0.0–0.1)
Basophils Relative: 0 %
Eosinophils Absolute: 0.1 10*3/uL (ref 0.0–0.5)
Eosinophils Relative: 1 %
HCT: 45.7 % (ref 39.0–52.0)
Hemoglobin: 14.6 g/dL (ref 13.0–17.0)
Immature Granulocytes: 1 %
Lymphocytes Relative: 8 %
Lymphs Abs: 0.9 10*3/uL (ref 0.7–4.0)
MCH: 30.1 pg (ref 26.0–34.0)
MCHC: 31.9 g/dL (ref 30.0–36.0)
MCV: 94.2 fL (ref 80.0–100.0)
Monocytes Absolute: 0.8 10*3/uL (ref 0.1–1.0)
Monocytes Relative: 7 %
Neutro Abs: 9.7 10*3/uL — ABNORMAL HIGH (ref 1.7–7.7)
Neutrophils Relative %: 83 %
PLATELETS: 182 10*3/uL (ref 150–400)
RBC: 4.85 MIL/uL (ref 4.22–5.81)
RDW: 14.2 % (ref 11.5–15.5)
WBC: 11.7 10*3/uL — ABNORMAL HIGH (ref 4.0–10.5)
nRBC: 0.2 % (ref 0.0–0.2)

## 2019-02-28 LAB — CBG MONITORING, ED: Glucose-Capillary: 93 mg/dL (ref 70–99)

## 2019-02-28 LAB — AMMONIA: Ammonia: 27 umol/L (ref 9–35)

## 2019-02-28 LAB — ETHANOL: Alcohol, Ethyl (B): <10 mg/dL (ref <10)

## 2019-02-28 LAB — TROPONIN I: Troponin I: 0.91 ng/mL (ref <0.03)

## 2019-02-28 MED ORDER — LORAZEPAM 2 MG/ML IJ SOLN: 1.0000 mg | INTRAMUSCULAR | Status: DC | PRN

## 2019-02-28 MED ORDER — LEVETIRACETAM 500 MG PO TABS
500.0000 mg | ORAL_TABLET | Freq: Two times a day (BID) | ORAL | Status: DC
  Administered 2019-02-28 – 2019-03-05 (×10): 500 mg via ORAL
  Filled 2019-02-28 (×10): qty 1

## 2019-02-28 MED ORDER — LORAZEPAM 1 MG PO TABS
1.0000 mg | ORAL_TABLET | Freq: Four times a day (QID) | ORAL | Status: AC | PRN
  Administered 2019-03-01 – 2019-03-03 (×3): 1 mg via ORAL
  Filled 2019-02-28 (×4): qty 1

## 2019-02-28 MED ORDER — ONDANSETRON HCL 4 MG PO TABS
4.0000 mg | ORAL_TABLET | Freq: Four times a day (QID) | ORAL | Status: DC | PRN
  Administered 2019-03-01: 4 mg via ORAL
  Filled 2019-02-28: qty 1

## 2019-02-28 MED ORDER — ENOXAPARIN SODIUM 40 MG/0.4ML ~~LOC~~ SOLN: 40.0000 mg | SUBCUTANEOUS | Status: DC

## 2019-02-28 MED ORDER — ACETAMINOPHEN 325 MG PO TABS
650.0000 mg | ORAL_TABLET | Freq: Four times a day (QID) | ORAL | Status: DC | PRN
  Administered 2019-03-04: 650 mg via ORAL
  Filled 2019-02-28 (×2): qty 2

## 2019-02-28 MED ORDER — PROSIGHT PO TABS
1.0000 | ORAL_TABLET | Freq: Every day | ORAL | Status: DC
  Administered 2019-03-01 – 2019-03-05 (×5): 1 via ORAL
  Filled 2019-02-28 (×5): qty 1

## 2019-02-28 MED ORDER — ASPIRIN 81 MG PO CHEW
324.0000 mg | CHEWABLE_TABLET | Freq: Once | ORAL | Status: AC
  Administered 2019-02-28: 324 mg via ORAL
  Filled 2019-02-28: qty 4

## 2019-02-28 MED ORDER — AMLODIPINE BESYLATE 5 MG PO TABS: 10.0000 mg | ORAL_TABLET | Freq: Every day | ORAL | Status: DC

## 2019-02-28 MED ORDER — VITAMIN B-1 100 MG PO TABS
100.0000 mg | ORAL_TABLET | Freq: Every day | ORAL | Status: DC
  Administered 2019-03-01 – 2019-03-05 (×5): 100 mg via ORAL
  Filled 2019-02-28 (×5): qty 1

## 2019-02-28 MED ORDER — ONDANSETRON HCL 4 MG/2ML IJ SOLN: 4.0000 mg | Freq: Four times a day (QID) | INTRAMUSCULAR | Status: DC | PRN

## 2019-02-28 MED ORDER — ASPIRIN 325 MG PO TABS
325.0000 mg | ORAL_TABLET | Freq: Every day | ORAL | Status: DC
  Administered 2019-03-01 – 2019-03-02 (×2): 325 mg via ORAL
  Filled 2019-02-28 (×2): qty 1

## 2019-02-28 MED ORDER — CARVEDILOL 12.5 MG PO TABS
6.2500 mg | ORAL_TABLET | Freq: Two times a day (BID) | ORAL | Status: DC
  Administered 2019-02-28 – 2019-03-05 (×10): 6.25 mg via ORAL
  Filled 2019-02-28 (×10): qty 1

## 2019-02-28 MED ORDER — ATORVASTATIN CALCIUM 80 MG PO TABS
80.0000 mg | ORAL_TABLET | Freq: Every day | ORAL | Status: DC
  Administered 2019-02-28 – 2019-03-04 (×5): 80 mg via ORAL
  Filled 2019-02-28 (×6): qty 1

## 2019-02-28 MED ORDER — NICOTINE 21 MG/24HR TD PT24: 21.0000 mg | MEDICATED_PATCH | Freq: Every day | TRANSDERMAL | Status: DC | PRN

## 2019-02-28 MED ORDER — LORAZEPAM 2 MG/ML IJ SOLN: 1.0000 mg | Freq: Four times a day (QID) | INTRAMUSCULAR | Status: AC | PRN

## 2019-02-28 MED ORDER — THIAMINE HCL 100 MG/ML IJ SOLN
Freq: Once | INTRAVENOUS | Status: AC
  Administered 2019-02-28: 21:00:00 via INTRAVENOUS
  Filled 2019-02-28: qty 1000

## 2019-02-28 MED ORDER — FOLIC ACID 1 MG PO TABS
1.0000 mg | ORAL_TABLET | Freq: Every day | ORAL | Status: DC
  Administered 2019-03-01 – 2019-03-05 (×5): 1 mg via ORAL
  Filled 2019-02-28 (×5): qty 1

## 2019-02-28 NOTE — H&P (Addendum)
History and Physical    PLEASE NOTE THAT DRAGON DICTATION SOFTWARE WAS USED IN THE CONSTRUCTION OF THIS NOTE.   Douglas Fowler WEX:[id number] DOB: 1966-01-26 DOA: 02/28/2019  PCP: Imagene Riches, NP Patient coming from: home  I have personally briefly reviewed patient's old medical records in [place]  Chief Complaint: confusion  HPI: Douglas Fowler is a 53 y.o. male with medical history significant for CAD s/p PCI with stents x 3, multiple ischemic CVA's, HTN, seizures, tobacco abuse, chronic alcohol abuse, who is admitted to [facility] on 02/28/19 with acute encephalopathy after from from home by way of EMS to [facility] ED for evaluation of confusion.   In the context of patient's current encephalopathy, the following history is obtained via my discussions with the patient's wife, whom I spoke with over the phone, my discussions with the ED physician, and via chart review.   The patient's wife reports that the patient was at baseline state of health as well as baseline mental status until approximately 1430 this afternoon (3/29), at which time the patient's wife noted the patient to be confused relative to baseline mental status.  Wife reports that she had left the patient side for approximately 15 minutes, and upon returning to him noted the aforementioned confusion, which she reports was new in the interval relative to the patient's mental status at the time that she had left his side.  She felt that the patient may be diaphoretic, but also acknowledged that they were outside and 80 degree temperatures at the time.  She did not note any associated tonic-clonic activity, tongue biting, or loss of bowel/bladder function.  She reports that the patient had been without complaint in the days leading up to the above, including no known chest pain, shortness of breath, palpitations, or n/v.  Patient's wife acknowledges that the patient does have a history of alcohol withdrawal seizures  associated with postictal states that it is not similar to the above.  She notes that the patient typically drinks 1/3 gallon per day of whiskey, and that his most recent consumption of alcohol occurred 3 days ago, on 02/25/2019.  No recent trauma.  Additionally, the patient's wife reports that the patient is on Keppra as an outpatient, and that he has been compliant in taking this medication as prescribed, without any known recent missed doses.  In terms of other notable past medical history, wife reports that the patient has a history of multiple acute ischemic strokes, with residual slurred speech and left hemiparesis.  Additionally, wife reports that the patient has a history of coronary artery disease and underwent PCI with stent placement x 3 " about 4 to 5 years ago".  Most recent echocardiogram occurred in September 2019, and showed LVEF 50 to 55%, no focal wall motion normalities, normal diastolic fucntion, mild mitral regurgitation, and a mildly dilated left atrium.    ED Course: Vital signs in the emergency department notable for the following: Temperature max 98.9, heart rate 77; blood pressure 170/88; respiratory rate 19-20, and oxygen saturation 98% on room air.  Labs performed in the ED were notable for the following: CMP notable for sodium 139, bicarbonate 24, BUN 15, creatinine 1.62 relative to most recent prior creatinine value of 1.3 on 12/18/2018.  CBC notable for white blood cell count of 11,700 with 83% neutrophils.  Troponin I times one 0.91.  Serum ethanol level negative.  Ammonia 27.  Additionally, the following labs were ordered in the ED,  with results pending at this time: Urinalysis, urine drug screen, and Keppra level.  Initial EKG showed normal sinus rhythm with heart rate 78, right axis, QTC 493 MS, T wave inversion in aVL, V4, and V5, of which inversion in aVL was unchanged relative to EKG from 12/10/2018, and no evidence of ST changes.  Repeat EKG performed 2.5 hours later,  showed normal sinus rhythm, left axis, flattening of T waves in aVL, and now upright T waves in V4 and V5, still without ST changes.  2 view chest x-ray, per final radiology report, showed stable cardiomegaly, but no evidence of acute cardiopulmonary process, including no evidence of infiltrate, edema, or effusion.  Noncontrast CT of the head, showed no evidence of acute process, including no evidence of intracranial hemorrhage or acute ischemic infarct.  Rather, CT of the head showed evidence of extensive old ischemic changes in the right hemisphere with atrophy associated with distribution, old small vessel infarctions in the basal ganglia and left cerebellum, old left parieto-occipital cortical and cortical infarction.  ED physician discussed the patient's case, EKGs, troponin finding with the on-call cardiologist, Dr. Hassell Done, who felt that the above is less suggestive of ACS, and that the variation in EKGs was suggestive of variation in lead placement. Dr. Hassell Done recommended overnight admission to the hospitalist service for trending of troponin, with prn cardiology consult in the morning.  Additionally, she recommended full dose aspirin x1 now, but did not feel that full dose anticoagulation was warranted at this time.  While still in the emergency department, aspirin 325 mg p.o. x1 was administered.  Subsequently, the patient was admitted to the med telemetry floor for further evaluation and management of presenting acute encephalopathy as well as work-up and management of presenting elevated troponin, as above.    Review of Systems: As per HPI otherwise 10 point review of systems negative.   Past Medical History:  Diagnosis Date   CAD (coronary artery disease)    s/p PCI with stents x 3   Chronic alcohol abuse    CVA (cerebral vascular accident) ([place])    multiple   Hypertension    Seizures ([place])    Tobacco abuse      Past Surgical History:  Procedure Laterality Date    CORONARY ANGIOPLASTY WITH STENT PLACEMENT     stents x 3      Social History:  reports that he has been smoking. He has a 5.00 pack-year smoking history. He has never used smokeless tobacco. He reports current alcohol use. He reports previous drug use.   No Known Allergies  Family History  Family history unknown: Yes   Family history reviewed and not pertinent    Prior to Admission medications   Medication Sig Start Date End Date Taking? Authorizing Provider  amLODipine (NORVASC) 10 MG tablet Take 1 tablet (10 mg total) by mouth daily. 12/18/18  Yes Sheikh, Omair Latif, DO  aspirin 325 MG tablet Take 1 tablet (325 mg total) by mouth daily. 12/18/18  Yes Sheikh, Omair Latif, DO  atorvastatin (LIPITOR) 80 MG tablet Take 1 tablet (80 mg total) by mouth daily at 6 PM. 12/18/18  Yes Sheikh, Omair Latif, DO  carvedilol (COREG) 6.25 MG tablet Take 1 tablet (6.25 mg total) by mouth 2 (two) times daily with a meal. 12/18/18  Yes Sheikh, Omair Latif, DO  levETIRAcetam (KEPPRA) 500 MG tablet Take 1 tablet (500 mg total) by mouth 2 (two) times daily. 12/18/18  Yes Sheikh, Omair Latif, DO  folic acid (  FOLVITE) 1 MG tablet Take 1 tablet (1 mg total) by mouth daily. Patient not taking: Reported on 02/28/2019 12/19/18   Raiford Noble Latif, DO  hydrOXYzine (ATARAX/VISTARIL) 10 MG tablet Take 1 tablet (10 mg total) by mouth 3 (three) times daily as needed for anxiety. Patient not taking: Reported on 02/28/2019 12/18/18   Raiford Noble Latif, DO  magnesium oxide (MAG-OX) 400 (241.3 Mg) MG tablet Take 1 tablet (400 mg total) by mouth 2 (two) times daily. Patient not taking: Reported on 02/28/2019 12/18/18   Raiford Noble Latif, DO  multivitamin (PROSIGHT) TABS tablet Take 1 tablet by mouth daily. Patient not taking: Reported on 02/28/2019 12/19/18   Raiford Noble Latif, DO  nicotine (NICODERM CQ - DOSED IN MG/24 HOURS) 21 mg/24hr patch Place 1 patch (21 mg total) onto the skin daily. Patient not taking: Reported on  02/28/2019 12/18/18   Raiford Noble Latif, DO  thiamine 100 MG tablet Take 1 tablet (100 mg total) by mouth daily. Patient not taking: Reported on 02/28/2019 12/19/18   Kerney Elbe, DO     Objective     Physical Exam: Vitals:   02/28/19 1509 02/28/19 1511  BP:  (!) 170/88  Pulse:  75  Resp:  19  Temp:  98.9 F (37.2 C)  SpO2:  98%  Weight: 84.8 kg   Height: 5\' 7"  (1.702 m)     General: appears to be stated age; alert; not oriented to person, place, or time. Skin: warm, dry, no rash Head:  AT/[place] Mouth:  Oral mucosa membranes appear dry, normal dentition Neck: supple; trachea midline Heart:  RRR; did not appreciate any M/R/G Lungs: CTAB, did not appreciate any wheezes, rales, or rhonchi Abdomen: + BS; soft, ND, NT Extremities: no peripheral edema, no muscle wasting Neuro: 5/5 strength of the proximal and distal flexors and extensors of the upper and lower extremities on the right; 4/5 strength of the proximal and distal flexors and extensors of the upper and lower extremities on the left (reportedly baseline); sensation intact in upper and lower extremities intact b/l; cranial nerves II through XII grossly intact; some slurring speech (reportedly baseline); normal muscle tone. No tremors.   Labs on Admission: I have personally reviewed following labs and imaging studies  CBC: Recent Labs  Lab 02/28/19 1641  WBC 11.7*  NEUTROABS 9.7*  HGB 14.6  HCT 45.7  MCV 94.2  PLT 161   Basic Metabolic Panel: Recent Labs  Lab 02/28/19 1641  NA 139  K 3.8  CL 103  CO2 24  GLUCOSE 103*  BUN 15  CREATININE 1.62*  CALCIUM 9.5   GFR: Estimated Creatinine Clearance: 54.3 mL/min (A) (by C-G formula based on SCr of 1.62 mg/dL (H)). Liver Function Tests: Recent Labs  Lab 02/28/19 1641  AST 24  ALT 11  ALKPHOS 98  BILITOT 0.9  PROT 6.7  ALBUMIN 3.5   No results for input(s): LIPASE, AMYLASE in the last 168 hours. Recent Labs  Lab 02/28/19 1641  AMMONIA 27    Coagulation Profile: No results for input(s): INR, PROTIME in the last 168 hours. Cardiac Enzymes: Recent Labs  Lab 02/28/19 1641  TROPONINI 0.91*   BNP (last 3 results) No results for input(s): PROBNP in the last 8760 hours. HbA1C: No results for input(s): HGBA1C in the last 72 hours. CBG: Recent Labs  Lab 02/28/19 1642  GLUCAP 93   Lipid Profile: No results for input(s): CHOL, HDL, LDLCALC, TRIG, CHOLHDL, LDLDIRECT in the last 72 hours. Thyroid Function  Tests: No results for input(s): TSH, T4TOTAL, FREET4, T3FREE, THYROIDAB in the last 72 hours. Anemia Panel: No results for input(s): VITAMINB12, FOLATE, FERRITIN, TIBC, IRON, RETICCTPCT in the last 72 hours. Urine analysis: No results found for: COLORURINE, APPEARANCEUR, LABSPEC, [place], GLUCOSEU, HGBUR, BILIRUBINUR, KETONESUR, PROTEINUR, UROBILINOGEN, NITRITE, LEUKOCYTESUR  Radiological Exams on Admission: Dg Chest 2 View  Result Date: 02/28/2019 CLINICAL DATA:  Acute mental status change EXAM: CHEST - 2 VIEW COMPARISON:  December 10, 2018 FINDINGS: Stable cardiomegaly. The hila, mediastinum, lungs, and pleura are otherwise unremarkable. IMPRESSION: No acute abnormalities.  Stable cardiomegaly. Electronically Signed   By: Dorise Bullion III M.D   On: 02/28/2019 16:17   Ct Head Wo Contrast  Result Date: 02/28/2019 CLINICAL DATA:  Altered mental status. 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 EXAM: CT HEAD WITHOUT CONTRAST TECHNIQUE: Contiguous axial images were obtained from the base of the skull through the vertex without intravenous contrast. COMPARISON:  12/15/2018 FINDINGS: Brain: No acute finding by CT. The brainstem shows wall air in degeneration on the right. Few old small vessel cerebellar infarctions. The right cerebral hemisphere shows extensive old ischemic changes throughout the right middle cerebral artery territory. Ischemic changes are present within the basal ganglia. No sign of acute extension. Left  hemisphere shows old small vessel infarctions in the basal ganglia and small-vessel changes of the white matter. There is a small region of old ischemic change in the left parieto-occipital cortical and subcortical brain. Vascular: There is atherosclerotic calcification of the major vessels at the base of the brain. Skull: Negative Sinuses/Orbits: Clear/normal Other: None IMPRESSION: No acute finding by CT. Extensive old ischemic changes in the right hemisphere with atrophy, encephalomalacia and gliosis. Old small vessel infarctions in both basal ganglia and in the left cerebellum. Old left parieto-occipital cortical and subcortical infarction. Electronically Signed   By: Nelson Chimes M.D.   On: 02/28/2019 16:03     Assessment/Plan   Douglas Fowler is a 53 y.o. male with medical history significant for CAD s/p PCI with stents x 3, multiple ischemic CVA's, HTN, seizures, tobacco abuse, chronic alcohol abuse, who is admitted to [facility] on 02/28/19 with acute encephalopathy after from from home by way of EMS to [facility] ED for evaluation of confusion.    Principal Problem:   Acute encephalopathy Active Problems:   Essential hypertension   AKI (acute kidney injury) ([place])   Elevated troponin   Chronic alcohol abuse   #) Acute metabolic encephalopathy: Per patient's wife, patient developed confusion starting on the afternoon of 02/28/2019, which, qualitatively appears consistent with postictal state associated with prior episodes of alcohol withdrawal seizures, the timeframe of which appears to fit report of the patient's last alcohol consumption occurring 3-4 days ago in the context of a known history of alcohol abuse, with today's serum ethanol level found to be negative.  Consequently, differential at this time favors acute encephalopathy on the basis postictal state stemming from suspected alcohol withdrawal seizure.  Clinically, no evidence of suggest acute underlying infectious process  at this time, and patient's wife specifically conveys that the patient has not been experiencing any fever nor has he had any exposure to known COVID-19 positive patients.  Also considered the possibility of encephalopathy on basis of acute ischemic CVA given patient's h/o multiple prior CVA's, however this appears less likely relative to the above at the present time given the above history as well as the absence of any acute focal neurologic deficits.   Plan:  Seizure precautions: PRN IV Ativan for breakthrough seizure.  Conservatively, will leave patient n.p.o. except for sips with meds overnight.  Continue home [place].  Will follow for result of Keppra level checked in the ED today.  Check TSH and VBG.  Serial neuro checks.  Repeat CMP and CBC in the morning.  Check prolactin level.    #) Elevated troponin: Troponin I x1 checked in the ED today was found to be 0.91, which was elevated relative to most recent prior troponin of 0.19 when checked on 12/11/2018. Patient's case, including this elevated troponin and aforementioned EKG findings in the context of history of CAD were discussed with the on-call cardiologist, Dr. Hassell Done, who felt that the above is less suggestive of ACS, and that the variation in EKGs was more suggestive of variation in lead placement. Dr. Hassell Done recommended overnight admission to the hospitalist service for trending of troponin, with prn cardiology consult in the morning.  Additionally, she recommended full dose aspirin x1 now, but did not feel that full dose anticoagulation was warranted at this time, unless the patient ultimately develops chest pain or EKG changes more suggestive of ischemia.  Of note, full dose aspirin administered in the ED.  Plan: Per cardiology recommendations, will trend serial troponin but refrain from initiation of formal anticoagulation unless patient develops chest pain or ischemic changes on EKG. monitor on telemetry.  Continue home high intensity  atorvastatin, with next dose to occur now for plaque stabilization qualities.  Continue home beta-blocker.  Add on serum magnesium level to labs collected in the ED.  Could consider obtaining updated echocardiogram in the morning for the purpose of comparison to echocardiogram performed in September 2019 for evaluation of interval development of focal wall motion abnormalities.    #) Acute kidney injury: Relative to baseline creatinine of 1.1-1.3, with most recent prior creatinine data point of 1.3 on 12/18/2018, presenting labs reflect mild elevation in creatinine to 1.62.  Suspect prerenal contributions, potentially from dehydration in the setting of wife's report that patient has been spending increased amount of time outside in the 80 degree weather over the last 2 days.  Plan: Check urinalysis, with attention for the presence of urinary cast.  Check random urine sodium as well as random urine creatinine.  Monitor strict I's and O's and attempt avoid nephrotoxic agents.  IV fluids, as further described below.  Repeat BMP in the morning.    #) Chronic alcohol abuse: Patient's wife reports that the patient drinks on average 1/3 gallon of whiskey per day, and has been doing so for several years now.  She reports that the patient's most recent alcohol consumption occurred on 02/25/2019, and that the patient has a documented history of alcohol withdrawal seizures.  Report of no alcohol consumed over the preceding last few days appears to be consistent with today's laboratory finding of negative serum ethanol level.  Plan: Banana bag x1 overnight, followed by transition to oral supplementation via thiamine, folic acid, and multivitamin.  Close monitoring of ensuing electrolytes, including via repeat BMP in the morning.  We will also check serum magnesium level as well as phosphorus level.  [place] protocol initiated, with PRN IV Ativan for symptoms-based management.  We will counseled the patient on the  importance of abstinence from alcohol consumption.     #) Hypertension: Outpatient antihypertensive regimen consists of Coreg as well as Norvasc.  Plan: We will hold home Norvasc in the context of presenting elevated troponin, as there would be a relative contraindication  to CCB in the event of ACS.  Continue home beta-blocker.    DVT prophylaxis: Lovenox 40 mg [place] qday Code Status: full Family Communication: case discussed with patient's wife, over the phone. Disposition Plan:  Per Rounding Team Consults called: Dr. Hassell Done of cardiology, as above.   Admission status: observation; med-tele.    PLEASE NOTE THAT DRAGON DICTATION SOFTWARE WAS USED IN THE CONSTRUCTION OF THIS NOTE.   [place] [facility] Pager [phone] From [place].   Otherwise, please contact night-coverage  [email or web address] Password [facility]  02/28/2019, 6:42 PM

## 2019-02-28 NOTE — ED Notes (Signed)
Patient transported to CT 

## 2019-02-28 NOTE — ED Notes (Signed)
Dr. Sherry Ruffing notified of Trop 0.91

## 2019-02-28 NOTE — ED Triage Notes (Signed)
Pt BIB [facility] for what family called cold symptoms. Family advised that the pt was sitting outside upon their arrival and was altered. EMS advised the pt was not able to answer questions. EMS reports pt is now CAOx2; alert to person and event. 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 Vital signs as noted. 12 lead unremarkable. No IV access.

## 2019-02-28 NOTE — ED Notes (Signed)
Attempted to get pt's urine. Pt tried but was not successful.

## 2019-02-28 NOTE — ED Notes (Signed)
ED TO INPATIENT HANDOFF REPORT  ED Nurse Name and Phone #:  Sharrie Rothman [phone]  S Name/Age/Gender Douglas Fowler 53 y.o. male Room/Bed: 034C/034C  Code Status   Code Status: Prior  Home/SNF/Other Home Patient oriented to: self Is this baseline? No   Triage Complete: Triage complete  Chief Complaint ams  Triage Note Pt BIB [facility] for what family called cold symptoms. Family advised that the pt was sitting outside upon their arrival and was altered. EMS advised the pt was not able to answer questions. EMS reports pt is now CAOx2; alert to person and event. 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 Vital signs as noted. 12 lead unremarkable. No IV access.     Allergies No Known Allergies  Level of Care/Admitting Diagnosis ED Disposition    ED Disposition Condition [facility] Area: [place] [[id number]]  Level of Care: Telemetry Medical [104]  I expect the patient will be discharged within 24 hours: Yes  LOW acuity---Tx typically complete <24 hrs---ACUTE conditions typically can be evaluated <24 hours---LABS likely to return to acceptable levels <24 hours---IS near functional baseline---EXPECTED to return to current living arrangement---NOT newly hypoxic: Does not meet criteria for 5C-Observation unit  Diagnosis: Acute encephalopathy [818299]  Admitting Physician: Rhetta Mura [[id number]]  Attending Physician: Rhetta Mura [[id number]]  PT Class (Do Not Modify): Observation [104]  PT Acc Code (Do Not Modify): Observation [10022]       B Medical/Surgery History Past Medical History:  Diagnosis Date  . Hypertension   . Seizures ([place])    History reviewed. No pertinent surgical history.   A IV Location/Drains/Wounds Patient Lines/Drains/Airways Status   Active Line/Drains/Airways    None          Intake/Output Last 24 hours No intake or output data in the 24 hours  ending 02/28/19 1922  Labs/Imaging Results for orders placed or performed during the hospital encounter of 02/28/19 (from the past 48 hour(s))  CBC with Differential     Status: Abnormal   Collection Time: 02/28/19  4:41 PM  Result Value Ref Range   WBC 11.7 (H) 4.0 - 10.5 K/uL   RBC 4.85 4.22 - 5.81 MIL/uL   Hemoglobin 14.6 13.0 - 17.0 g/dL   HCT 45.7 39.0 - 52.0 %   MCV 94.2 80.0 - 100.0 fL   MCH 30.1 26.0 - 34.0 pg   MCHC 31.9 30.0 - 36.0 g/dL   RDW 14.2 11.5 - 15.5 %   Platelets 182 150 - 400 K/uL   nRBC 0.2 0.0 - 0.2 %   Neutrophils Relative % 83 %   Neutro Abs 9.7 (H) 1.7 - 7.7 K/uL   Lymphocytes Relative 8 %   Lymphs Abs 0.9 0.7 - 4.0 K/uL   Monocytes Relative 7 %   Monocytes Absolute 0.8 0.1 - 1.0 K/uL   Eosinophils Relative 1 %   Eosinophils Absolute 0.1 0.0 - 0.5 K/uL   Basophils Relative 0 %   Basophils Absolute 0.1 0.0 - 0.1 K/uL   Immature Granulocytes 1 %   Abs Immature Granulocytes 0.07 0.00 - 0.07 K/uL    Comment: Performed at [facility], [address], [place], [place] [address]  Comprehensive metabolic panel     Status: Abnormal   Collection Time: 02/28/19  4:41 PM  Result Value Ref Range   Sodium 139 135 - 145 mmol/L  Potassium 3.8 3.5 - 5.1 mmol/L   Chloride 103 98 - 111 mmol/L   CO2 24 22 - 32 mmol/L   Glucose, Bld 103 (H) 70 - 99 mg/dL   BUN 15 6 - 20 mg/dL   Creatinine, Ser 1.62 (H) 0.61 - 1.24 mg/dL   Calcium 9.5 8.9 - 10.3 mg/dL   Total Protein 6.7 6.5 - 8.1 g/dL   Albumin 3.5 3.5 - 5.0 g/dL   AST 24 15 - 41 U/L   ALT 11 0 - 44 U/L   Alkaline Phosphatase 98 38 - 126 U/L   Total Bilirubin 0.9 0.3 - 1.2 mg/dL   GFR calc non Af Amer 47 (L) >60 mL/min   GFR calc Af Amer 55 (L) >60 mL/min   Anion gap 12 5 - 15    Comment: Performed at [facility] [address], [place], [place] [address]  Troponin I - ONCE - STAT     Status: Abnormal   Collection Time: 02/28/19  4:41 PM  Result Value Ref Range   Troponin I 0.91 (HH) <0.03  ng/mL    Comment: CRITICAL RESULT CALLED TO, READ BACK BY AND VERIFIED WITHSantiago Glad [facility] RN AT 1937 02/28/2019 BY [facility] Performed at [facility], [address], [place], [place] [address]   Ammonia     Status: None   Collection Time: 02/28/19  4:41 PM  Result Value Ref Range   Ammonia 27 9 - 35 umol/L    Comment: Performed at [facility], [place] [address] [place], [place] [address]  Ethanol     Status: None   Collection Time: 02/28/19  4:41 PM  Result Value Ref Range   Alcohol, Ethyl (B) <10 <10 mg/dL    Comment: (NOTE) Lowest detectable limit for serum alcohol is 10 mg/dL. For medical purposes only. Performed at [facility], [place] [address] [place], [place] [address]   POC CBG, ED     Status: None   Collection Time: 02/28/19  4:42 PM  Result Value Ref Range   Glucose-Capillary 93 70 - 99 mg/dL   Dg Chest 2 View  Result Date: 02/28/2019 CLINICAL DATA:  Acute mental status change EXAM: CHEST - 2 VIEW COMPARISON:  December 10, 2018 FINDINGS: Stable cardiomegaly. The hila, mediastinum, lungs, and pleura are otherwise unremarkable. IMPRESSION: No acute abnormalities.  Stable cardiomegaly. Electronically Signed   By: Dorise Bullion III M.D   On: 02/28/2019 16:17   Ct Head Wo Contrast  Result Date: 02/28/2019 CLINICAL DATA:  Altered mental status. 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 EXAM: CT HEAD WITHOUT CONTRAST TECHNIQUE: Contiguous axial images were obtained from the base of the skull through the vertex without intravenous contrast. COMPARISON:  12/15/2018 FINDINGS: Brain: No acute finding by CT. The brainstem shows wall air in degeneration on the right. Few old small vessel cerebellar infarctions. The right cerebral hemisphere shows extensive old ischemic changes throughout the right middle cerebral artery territory. Ischemic changes are present within the basal ganglia. No sign of acute extension. Left hemisphere shows old small vessel infarctions in  the basal ganglia and small-vessel changes of the white matter. There is a small region of old ischemic change in the left parieto-occipital cortical and subcortical brain. Vascular: There is atherosclerotic calcification of the major vessels at the base of the brain. Skull: Negative Sinuses/Orbits: Clear/normal Other: None IMPRESSION: No acute finding by CT. Extensive old ischemic changes in the right hemisphere with atrophy, encephalomalacia  and gliosis. Old small vessel infarctions in both basal ganglia and in the left cerebellum. Old left parieto-occipital cortical and subcortical infarction. Electronically Signed   By: Nelson Chimes M.D.   On: 02/28/2019 16:03    Pending Labs Unresulted Labs (From admission, onward)    Start     Ordered   02/28/19 1520  Urinalysis, Routine w reflex microscopic  Once,   R     02/28/19 1520   02/28/19 1520  Urine culture  ONCE - STAT,   STAT     02/28/19 1520   02/28/19 1520  Rapid urine drug screen (hospital performed)  ONCE - STAT,   R     02/28/19 1520   02/28/19 1518  Levetiracetam level  Once,   R     02/28/19 1520          Vitals/Pain Today's Vitals   02/28/19 1509 02/28/19 1511  BP:  (!) 170/88  Pulse:  75  Resp:  19  Temp:  98.9 F (37.2 C)  SpO2:  98%  Weight: 84.8 kg   Height: 5\' 7"  (1.702 m)   PainSc: 0-No pain     Isolation Precautions No active isolations  Medications Medications  acetaminophen (TYLENOL) tablet 650 mg (has no administration in time range)  aspirin chewable tablet 324 mg (324 mg Oral Given 02/28/19 1903)    Mobility walks High fall risk   Focused Assessments Neuro Assessment Handoff:  Swallow screen pass? Yes    NIH Stroke Scale ( + Modified Stroke Scale Criteria)  Interval: Initial Level of Consciousness (1a.)   : Alert, keenly responsive LOC Questions (1b. )   +: Answers both questions correctly LOC Commands (1c. )   + : Performs both tasks correctly Best Gaze (2. )  +: Normal Visual (3. )  +:  Partial hemianopia Facial Palsy (4. )    : Normal symmetrical movements Motor Arm, Left (5a. )   +: Some effort against gravity Motor Arm, Right (5b. )   +: No drift Motor Leg, Left (6a. )   +: Drift Motor Leg, Right (6b. )   +: No drift Limb Ataxia (7. ): Present in one limb Sensory (8. )   +: Normal, no sensory loss Best Language (9. )   +: Mild-to-moderate aphasia Dysarthria (10. ): Mild-to-moderate dysarthria, patient slurs at least some words and, at worst, can be understood with some difficulty Extinction/Inattention (11.)   +: No Abnormality Modified SS Total  +: 5 Complete NIHSS TOTAL: 7 Last date known well: 02/28/19 Last time known well: 1200 Neuro Assessment: Exceptions to WDL Neuro Checks:   Initial (02/28/19 1515)  Last Documented NIHSS Modified Score: 5 (02/28/19 1515) Has TPA been given? No If patient is a Neuro Trauma and patient is going to OR before floor call report to [place] nurse: [phone] or [phone]     R Recommendations: See Admitting Provider Note  Report given to:   Additional Notes:  Pt repeatedly attempts to get out of bed, but can be re-directed.

## 2019-02-28 NOTE — Consult Note (Signed)
Cardiology Consultation   Patient ID: Douglas Fowler; [id number]; Jul 28, 1966   Admit date: 02/28/2019 Date of Consult: 02/28/2019  Referring Provider:  Paul Dykes  Primary Care Provider: Imagene Riches, NP Cardiologist: NA Electrophysiologist:  NA  Reason for Consultation: elevated troponin  History of Present Illness: Douglas Fowler is a 53 y.o. male who is being seen today for the evaluation of elevated trop of 0.91 at the request of hospital medicine svc. Pt gives h/o CAD, with h/o PCI in [place] 3-4 years ago. No cath records available. He tells me he thinks he had a negative stress test last year. He also has h/o seizure d/o as well as heavy EtOH use and EtOH withdrawal. He was admitted for EtOH withdrawal and related seizure in Jan 2020; during that admission, he had trop of 0.20. He does have h/o mild CKD. At the time of that admission, BP was elevated at 201/90.  According to EMS, patient has been altered per family today.  Therefore patient was sitting outside and acting confused.  Per EMS, patient's mental status had improved during transport and upon arrival.  Patient is now oriented x2 but was initially not oriented whatsoever.  EMS reports that his stroke deficits of slurred speech, left vision abnormality, and left-sided weakness are unchanged from his baseline.  Patient now says that he has no symptoms aside from feeling improved confusion.  He denies recent fevers, chills, congestion, cough, shortness of breath, nausea, vomiting, urinary symptoms or GI symptoms.  He denies recent trauma.  He reports that he had a last seizure 3 weeks ago and says he has not been taking his Keppra for the last week.  He reports this feels similar to prior postictal periods after a seizure.  He denies any other complaints on arrival and specifically denies exposure to sick contacts or respiratory symptoms. He tells me he has not had any recent anginal sx; no sx of exertional chest discomfort,  DOE or any other significant cardiac sx.  Past Medical History:  Diagnosis Date  . Hypertension   . Seizures ([place])     History reviewed. No pertinent surgical history.    Current Medications: No current facility-administered medications on file prior to encounter.    Current Outpatient Medications on File Prior to Encounter  Medication Sig Dispense Refill  . amLODipine (NORVASC) 10 MG tablet Take 1 tablet (10 mg total) by mouth daily. 30 tablet 0  . aspirin 325 MG tablet Take 1 tablet (325 mg total) by mouth daily. 30 tablet 0  . atorvastatin (LIPITOR) 80 MG tablet Take 1 tablet (80 mg total) by mouth daily at 6 PM. 30 tablet 0  . carvedilol (COREG) 6.25 MG tablet Take 1 tablet (6.25 mg total) by mouth 2 (two) times daily with a meal. 60 tablet 0  . levETIRAcetam (KEPPRA) 500 MG tablet Take 1 tablet (500 mg total) by mouth 2 (two) times daily. 60 tablet 0  . folic acid (FOLVITE) 1 MG tablet Take 1 tablet (1 mg total) by mouth daily. (Patient not taking: Reported on 02/28/2019) 30 tablet 0  . hydrOXYzine (ATARAX/VISTARIL) 10 MG tablet Take 1 tablet (10 mg total) by mouth 3 (three) times daily as needed for anxiety. (Patient not taking: Reported on 02/28/2019) 30 tablet 0  . magnesium oxide (MAG-OX) 400 (241.3 Mg) MG tablet Take 1 tablet (400 mg total) by mouth 2 (two) times daily. (Patient not taking: Reported on 02/28/2019) 6 tablet 0  . multivitamin (PROSIGHT) TABS tablet Take  1 tablet by mouth daily. (Patient not taking: Reported on 02/28/2019) 30 each 0  . nicotine (NICODERM CQ - DOSED IN MG/24 HOURS) 21 mg/24hr patch Place 1 patch (21 mg total) onto the skin daily. (Patient not taking: Reported on 02/28/2019) 28 patch 0  . thiamine 100 MG tablet Take 1 tablet (100 mg total) by mouth daily. (Patient not taking: Reported on 02/28/2019) 30 tablet 0      PRN Medications: acetaminophen   Allergies:   No Known Allergies  Social History:   The patient  reports that he has been smoking. He  has never used smokeless tobacco. He reports current alcohol use. He reports previous drug use.    Family History:   The patient's Family history is unknown by patient.   ROS:  Please see the history of present illness.  All other ROS reviewed and negative.     Vital Signs: Blood pressure (!) 196/81, pulse 70, temperature 98.9 F (37.2 C), resp. rate (!) 21, height 5\' 7"  (1.702 m), weight 84.8 kg, SpO2 100 %.   PHYSICAL EXAM: General:  Well nourished, well developed, in no acute distress. Resting comfortably w/o cardiac complaints HEENT: normal Lymph: no adenopathy Neck: no JVD Endocrine:  No thryomegaly Vascular: No carotid bruits; DP pulses 2+ bilaterally   Cardiac:  normal S1, S2; RRR; no murmur  Lungs:  clear to auscultation bilaterally, no wheezing, rhonchi or rales  Abd: soft, nontender, no hepatomegaly  Ext: no edema Musculoskeletal:  No deformities, BUE and BLE strength normal and equal Skin: warm and dry  Neuro:  CNs 2-12 intact, no focal abnormalities noted Psych:  Normal affect   EKG:  1st 12-lead shows NSR w/ right superior axis, nonspecific ST changes Repeat tracing shows change in QRS axis, no acute ST changes (possible lead reversal?)  Labs: Recent Labs    02/28/19 1641  TROPONINI 0.91*   No results for input(s): TROPIPOC in the last 72 hours.  Lab Results  Component Value Date   WBC 11.7 (H) 02/28/2019   HGB 14.6 02/28/2019   HCT 45.7 02/28/2019   MCV 94.2 02/28/2019   PLT 182 02/28/2019   Recent Labs  Lab 02/28/19 1641  NA 139  K 3.8  CL 103  CO2 24  BUN 15  CREATININE 1.62*  CALCIUM 9.5  PROT 6.7  BILITOT 0.9  ALKPHOS 98  ALT 11  AST 24  GLUCOSE 103*   Lab Results  Component Value Date   CHOL 175 08/05/2018   HDL 38 (L) 08/05/2018   LDLCALC 104 (H) 08/05/2018   TRIG 167 (H) 08/05/2018   No results found for: DDIMER  Radiology/Studies:  Dg Chest 2 View  Result Date: 02/28/2019 CLINICAL DATA:  Acute mental status change EXAM:  CHEST - 2 VIEW COMPARISON:  December 10, 2018 FINDINGS: Stable cardiomegaly. The hila, mediastinum, lungs, and pleura are otherwise unremarkable. IMPRESSION: No acute abnormalities.  Stable cardiomegaly. Electronically Signed   By: Dorise Bullion III M.D   On: 02/28/2019 16:17   Ct Head Wo Contrast  Result Date: 02/28/2019 CLINICAL DATA:  Altered mental status. 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 EXAM: CT HEAD WITHOUT CONTRAST TECHNIQUE: Contiguous axial images were obtained from the base of the skull through the vertex without intravenous contrast. COMPARISON:  12/15/2018 FINDINGS: Brain: No acute finding by CT. The brainstem shows wall air in degeneration on the right. Few old small vessel cerebellar infarctions. The right cerebral hemisphere shows extensive old ischemic changes throughout the right  middle cerebral artery territory. Ischemic changes are present within the basal ganglia. No sign of acute extension. Left hemisphere shows old small vessel infarctions in the basal ganglia and small-vessel changes of the white matter. There is a small region of old ischemic change in the left parieto-occipital cortical and subcortical brain. Vascular: There is atherosclerotic calcification of the major vessels at the base of the brain. Skull: Negative Sinuses/Orbits: Clear/normal Other: None IMPRESSION: No acute finding by CT. Extensive old ischemic changes in the right hemisphere with atrophy, encephalomalacia and gliosis. Old small vessel infarctions in both basal ganglia and in the left cerebellum. Old left parieto-occipital cortical and subcortical infarction. Electronically Signed   By: Nelson Chimes M.D.   On: 02/28/2019 16:03    ASSESSMENT AND PLAN:  1. Elevated trop: trop <1; pt has had mild trop elevation in the past in setting of seizure activity. Would recommend IV heparin gtt and cycle enzymes to determine troponin trend. Trop may be at least in part due to demand from elevated BP.  Management plan will likely depend on the direction taken by the troponin. Will order TTE.  2. EtOH withdrawal/seizure d/o: mgmt as per primary medicine team  3. HTN: BP is elevated. Would start home anti-HTN regimen and titrate PRN  4. Dyslipidemia: last LDL 104; goal LDL<104 given h/o CAD. Pt on high-dose statin; compliance may be an issue.  Thank you for the opportunity to participate in the care of this patient.  For questions or updates, please contact [place] Please consult [email or web address] for contact info under    Signed, Rudean Curt, MD, [facility]  02/28/2019 7:47 PM

## 2019-02-28 NOTE — ED Notes (Signed)
Admitting md at bedside

## 2019-02-28 NOTE — ED Provider Notes (Signed)
[facility] EMERGENCY DEPARTMENT Provider Note   CSN: [id number] Arrival date & time: 02/28/19  1500    History   Chief Complaint Chief Complaint  Patient presents with  . Altered Mental Status    HPI Douglas Fowler is a 53 y.o. male.     The history is provided by the patient and medical records. No language interpreter was used.  Altered Mental Status  Presenting symptoms: confusion and disorientation   Severity:  Moderate Most recent episode:  Today Episode history:  Single Timing:  Constant Progression:  Improving Chronicity:  Recurrent Context: not taking medications as prescribed   Context: not head injury   Associated symptoms: light-headedness, seizures (hx oif) and slurred speech (at baseline)   Associated symptoms: no abdominal pain, no agitation, no difficulty breathing, no fever, no headaches, no nausea, no palpitations, no rash, no visual change, no vomiting and no weakness     Past Medical History:  Diagnosis Date  . Hypertension   . Seizures [facility])     Patient Active Problem List   Diagnosis Date Noted  . Alcohol withdrawal delirium ([place]) 12/11/2018  . Alcohol withdrawal ([place]) 12/11/2018  . Alcohol withdrawal seizure with complication, with unspecified complication ([place]) 21/19/4174  . Essential hypertension 08/05/2018  . CAD (coronary artery disease) 08/05/2018  . Tobacco abuse 08/05/2018  . Hypokalemia   . AKI (acute kidney injury) ([place])   . Acute blood loss anemia   . CVA (cerebral vascular accident) ([place]) 08/04/2018    History reviewed. No pertinent surgical history.      Home Medications    Prior to Admission medications   Medication Sig Start Date End Date Taking? Authorizing Provider  amLODipine (NORVASC) 10 MG tablet Take 1 tablet (10 mg total) by mouth daily. 12/18/18   Raiford Noble Latif, DO  aspirin 325 MG tablet Take 1 tablet (325 mg total) by mouth daily. 12/18/18   Raiford Noble Latif, DO  atorvastatin  (LIPITOR) 80 MG tablet Take 1 tablet (80 mg total) by mouth daily at 6 PM. 12/18/18   Raiford Noble Latif, DO  carvedilol (COREG) 6.25 MG tablet Take 1 tablet (6.25 mg total) by mouth 2 (two) times daily with a meal. 12/18/18   Sheikh, Georgina Quint Latif, DO  folic acid (FOLVITE) 1 MG tablet Take 1 tablet (1 mg total) by mouth daily. 12/19/18   Raiford Noble Latif, DO  hydrOXYzine (ATARAX/VISTARIL) 10 MG tablet Take 1 tablet (10 mg total) by mouth 3 (three) times daily as needed for anxiety. 12/18/18   Sheikh, Omair Latif, DO  levETIRAcetam (KEPPRA) 500 MG tablet Take 1 tablet (500 mg total) by mouth 2 (two) times daily. 12/18/18   Raiford Noble Latif, DO  magnesium oxide (MAG-OX) 400 (241.3 Mg) MG tablet Take 1 tablet (400 mg total) by mouth 2 (two) times daily. 12/18/18   Raiford Noble Latif, DO  multivitamin (PROSIGHT) TABS tablet Take 1 tablet by mouth daily. 12/19/18   Sheikh, Omair Latif, DO  nicotine (NICODERM CQ - DOSED IN MG/24 HOURS) 21 mg/24hr patch Place 1 patch (21 mg total) onto the skin daily. 12/18/18   Raiford Noble Latif, DO  thiamine 100 MG tablet Take 1 tablet (100 mg total) by mouth daily. 12/19/18   Kerney Elbe, DO    Family History Family History  Family history unknown: Yes    Social History Social History   Tobacco Use  . Smoking status: Never Smoker  . Smokeless tobacco: Never Used  Substance Use Topics  .  Alcohol use: Yes  . Drug use: Not Currently     Allergies   Patient has no known allergies.   Review of Systems Review of Systems  Constitutional: Negative for chills, diaphoresis, fatigue and fever.  HENT: Negative for congestion.   Eyes: Negative for photophobia.  Respiratory: Negative for cough, chest tightness, shortness of breath and wheezing.   Cardiovascular: Negative for chest pain, palpitations and leg swelling.  Gastrointestinal: Negative for abdominal pain, constipation, diarrhea, nausea and vomiting.  Musculoskeletal: Negative for back pain,  neck pain and neck stiffness.  Skin: Negative for rash and wound.  Neurological: Positive for seizures (hx oif), speech difficulty (at baseline) and light-headedness. Negative for dizziness, facial asymmetry, weakness, numbness and headaches.  Psychiatric/Behavioral: Positive for confusion. Negative for agitation.  All other systems reviewed and are negative.    Physical Exam Updated Vital Signs BP (!) 170/88   Pulse 75   Temp 98.9 F (37.2 C)   Resp 19   Ht 5\' 7"  (1.702 m)   Wt 84.8 kg   SpO2 98%   BMI 29.28 kg/m   Physical Exam Vitals signs and nursing note reviewed.  Constitutional:      General: He is not in acute distress.    Appearance: He is well-developed. He is not ill-appearing, toxic-appearing or diaphoretic.  HENT:     Head: Normocephalic and atraumatic.     Nose: No congestion or rhinorrhea.     Mouth/Throat:     Mouth: Mucous membranes are moist.     Pharynx: No oropharyngeal exudate or posterior oropharyngeal erythema.  Eyes:     Extraocular Movements: Extraocular movements intact.     Conjunctiva/sclera: Conjunctivae normal.     Pupils: Pupils are equal, round, and reactive to light.  Neck:     Musculoskeletal: Neck supple.  Cardiovascular:     Rate and Rhythm: Normal rate and regular rhythm.     Heart sounds: No murmur.  Pulmonary:     Effort: Pulmonary effort is normal. No respiratory distress.     Breath sounds: Normal breath sounds.  Abdominal:     General: There is no distension.     Palpations: Abdomen is soft.     Tenderness: There is no abdominal tenderness.  Musculoskeletal:        General: No tenderness.     Right lower leg: No edema.     Left lower leg: No edema.  Skin:    General: Skin is warm and dry.     Capillary Refill: Capillary refill takes less than 2 seconds.     Findings: No erythema.  Neurological:     Mental Status: He is alert. Mental status is at baseline.     Sensory: No sensory deficit.     Motor: No weakness.      Coordination: Coordination normal.  Psychiatric:        Mood and Affect: Mood normal.      ED Treatments / Results  Labs (all labs ordered are listed, but only abnormal results are displayed) Labs Reviewed  CBC WITH DIFFERENTIAL/PLATELET - Abnormal; Notable for the following components:      Result Value   WBC 11.7 (*)    Neutro Abs 9.7 (*)    All other components within normal limits  COMPREHENSIVE METABOLIC PANEL - Abnormal; Notable for the following components:   Glucose, Bld 103 (*)    Creatinine, Ser 1.62 (*)    GFR calc non Af Amer 47 (*)    GFR calc  Af Amer 55 (*)    All other components within normal limits  TROPONIN I - Abnormal; Notable for the following components:   Troponin I 0.91 (*)    All other components within normal limits  URINE CULTURE  AMMONIA  ETHANOL  LEVETIRACETAM LEVEL  URINALYSIS, ROUTINE W REFLEX MICROSCOPIC  RAPID URINE DRUG SCREEN, HOSP PERFORMED  MAGNESIUM  TSH  CBC  BASIC METABOLIC PANEL  MAGNESIUM  TROPONIN I  TROPONIN I  BLOOD GAS, VENOUS  PROLACTIN  CREATININE, URINE, RANDOM  SODIUM, URINE, RANDOM  CBG MONITORING, ED    EKG EKG Interpretation  Date/Time:  Sunday February 28 2019 17:39:10 EDT Ventricular Rate:  70 PR Interval:    QRS Duration: 91 QT Interval:  447 QTC Calculation: 483 R Axis:   -35 Text Interpretation:  Sinus rhythm Left axis deviation Borderline prolonged QT interval When compared to prior, t waves now upright in leads V4-V5.  Also deep T wave in lead 1 and AVL now resolved.  No STEMI Confirmed by Antony Blackbird [phone]) on 02/28/2019 5:45:14 PM   Radiology Dg Chest 2 View  Result Date: 02/28/2019 CLINICAL DATA:  Acute mental status change EXAM: CHEST - 2 VIEW COMPARISON:  December 10, 2018 FINDINGS: Stable cardiomegaly. The hila, mediastinum, lungs, and pleura are otherwise unremarkable. IMPRESSION: No acute abnormalities.  Stable cardiomegaly. Electronically Signed   By: Dorise Bullion III M.D   On:  02/28/2019 16:17   Ct Head Wo Contrast  Result Date: 02/28/2019 CLINICAL DATA:  Altered mental status. 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 EXAM: CT HEAD WITHOUT CONTRAST TECHNIQUE: Contiguous axial images were obtained from the base of the skull through the vertex without intravenous contrast. COMPARISON:  12/15/2018 FINDINGS: Brain: No acute finding by CT. The brainstem shows wall air in degeneration on the right. Few old small vessel cerebellar infarctions. The right cerebral hemisphere shows extensive old ischemic changes throughout the right middle cerebral artery territory. Ischemic changes are present within the basal ganglia. No sign of acute extension. Left hemisphere shows old small vessel infarctions in the basal ganglia and small-vessel changes of the white matter. There is a small region of old ischemic change in the left parieto-occipital cortical and subcortical brain. Vascular: There is atherosclerotic calcification of the major vessels at the base of the brain. Skull: Negative Sinuses/Orbits: Clear/normal Other: None IMPRESSION: No acute finding by CT. Extensive old ischemic changes in the right hemisphere with atrophy, encephalomalacia and gliosis. Old small vessel infarctions in both basal ganglia and in the left cerebellum. Old left parieto-occipital cortical and subcortical infarction. Electronically Signed   By: Nelson Chimes M.D.   On: 02/28/2019 16:03    Procedures Procedures (including critical care time)  CRITICAL CARE Performed by: Gwenyth Allegra Moxie Kalil Total critical care time: 35 minutes Critical care time was exclusive of separately billable procedures and treating other patients. Positive Trop with EKG changes. Admitted for ACS rule out and AMS.  Critical care was necessary to treat or prevent imminent or life-threatening deterioration. Critical care was time spent personally by me on the following activities: development of treatment plan with patient and/or  surrogate as well as nursing, discussions with consultants, evaluation of patient's response to treatment, examination of patient, obtaining history from patient or surrogate, ordering and performing treatments and interventions, ordering and review of laboratory studies, ordering and review of radiographic studies, pulse oximetry and re-evaluation of patient's condition.   Medications Ordered in ED Medications  acetaminophen (TYLENOL) tablet 650 mg (has no  administration in time range)  folic acid (FOLVITE) tablet 1 mg (has no administration in time range)  multivitamin (PROSIGHT) tablet 1 tablet (has no administration in time range)  thiamine tablet 100 mg (has no administration in time range)  aspirin tablet 325 mg (has no administration in time range)  amLODipine (NORVASC) tablet 10 mg (has no administration in time range)  atorvastatin (LIPITOR) tablet 80 mg (has no administration in time range)  carvedilol (COREG) tablet 6.25 mg (has no administration in time range)  levETIRAcetam (KEPPRA) tablet 500 mg (has no administration in time range)  enoxaparin (LOVENOX) injection 40 mg (has no administration in time range)  sodium chloride 0.9 % 1,000 mL with thiamine 381 mg, folic acid 1 mg, multivitamins adult 10 mL infusion (has no administration in time range)  ondansetron (ZOFRAN) tablet 4 mg (has no administration in time range)    Or  ondansetron (ZOFRAN) injection 4 mg (has no administration in time range)  LORazepam (ATIVAN) tablet 1 mg (has no administration in time range)    Or  LORazepam (ATIVAN) injection 1 mg (has no administration in time range)  LORazepam (ATIVAN) injection 1 mg (has no administration in time range)  nicotine (NICODERM CQ - dosed in mg/24 hours) patch 21 mg (has no administration in time range)  aspirin chewable tablet 324 mg (324 mg Oral Given 02/28/19 1903)     Initial Impression / Assessment and Plan / ED Course  I have reviewed the triage vital signs and  the nursing notes.  Pertinent labs & imaging results that were available during my care of the patient were reviewed by me and considered in my medical decision making (see chart for details).        AMJAD FIKES is a 53 y.o. male with a past medical history significant for CAD, hypertension, prior alcohol withdrawal with seizure, prior stroke with residual symptoms who presents with altered mental status.  According to EMS, patient has been altered per family today.  Therefore patient was sitting outside and acting confused.  Per EMS, patient's mental status had improved during transport and upon arrival.  Patient is now oriented x2 but was initially not oriented whatsoever.  EMS reports that his stroke deficits of slurred speech, left vision abnormality, and left-sided weakness are unchanged from his baseline.  Patient now says that he has no symptoms aside from feeling improved confusion.  He denies recent fevers, chills, congestion, cough, shortness of breath, nausea, vomiting, urinary symptoms or GI symptoms.  He denies recent trauma.  He reports that he had a last seizure 3 weeks ago and says he has not been taking his Keppra for the last week.  He reports this feels similar to prior postictal periods after a seizure.  He denies any other complaints on arrival and specifically denies exposure to sick contacts or respiratory symptoms.  On exam, patient is hypertensive with blood pressure in the 829 systolic.  Patient is afebrile, is not tachycardic, and is resting comfortably.  Patient's lungs were clear on my exam, no murmur, chest is nontender.  Abdomen is nontender.  Patient moving all extremities with symmetric strength on my exam.  Normal sensation.  Patient had some difficulty following commands with finger-nose-finger testing but ultimately was able to do so.  Patient had normal extraocular movements.  Patient was oriented to person and place but was disoriented to time.  He denies any  headache, vision changes, or other complaints.  Clinically patient work-up to look for altered  mental status however I suspect he is postictal from what sounds to be a seizure.  I suspect the seizures are due to not taking his medications.  Patient will have work-up to look for occult infection or intracranial normality.  Head CT will be obtained.  Given his reported improvement in his thinking and his lack of other complaints, anticipate patient will be stable for discharge home after work-up if no significant abnormalities are found.  Spoke with the patient's wife who reports that today he was extremely diaphoretic and confused.  He did not complain of chest pain or shortness of breath but she reports he looked very ill.  This is when EMS was called.  5:51 PM Initial troponin returned elevated at 0.91.  This is more elevated than prior.  Repeat EKG showed some dynamic EKG changes with resolution of the deep Q waves in lead I and aVL and now the T wave inversions in V4 V5 are upright.  Cardiology will be called.  6:06 PM Just spoke with cardiology who did not feel the patients EKG suggest ischemia at this time.  They request patient admitted to a medicine service for troponin trending and further management.  Patient will be given aspirin and will be monitored.  Patient still is chest pain and shortness of breath free.  He reports he is feeling normal currently.  Medicine team called for admission who will admit patient.   Final Clinical Impressions(s) / ED Diagnoses   Final diagnoses:  Transient alteration of awareness  Elevated troponin  EKG abnormalities  Diaphoresis    ED Discharge Orders    None      Clinical Impression: 1. Transient alteration of awareness   2. Elevated troponin   3. EKG abnormalities   4. Diaphoresis     Disposition: Admit  This note was prepared with assistance of Dragon voice recognition software. Occasional wrong-word or sound-a-like substitutions may  have occurred due to the inherent limitations of voice recognition software.     Hlee Fringer, Gwenyth Allegra, MD 02/28/19 [phone]

## 2019-03-01 ENCOUNTER — Observation Stay (HOSPITAL_COMMUNITY): Payer: Medicare HMO

## 2019-03-01 ENCOUNTER — Observation Stay (HOSPITAL_BASED_OUTPATIENT_CLINIC_OR_DEPARTMENT_OTHER): Payer: Medicare HMO

## 2019-03-01 DIAGNOSIS — F1010 Alcohol abuse, uncomplicated: Secondary | ICD-10-CM | POA: Diagnosis not present

## 2019-03-01 DIAGNOSIS — R7989 Other specified abnormal findings of blood chemistry: Secondary | ICD-10-CM | POA: Diagnosis not present

## 2019-03-01 DIAGNOSIS — I214 Non-ST elevation (NSTEMI) myocardial infarction: Secondary | ICD-10-CM | POA: Diagnosis not present

## 2019-03-01 DIAGNOSIS — G9340 Encephalopathy, unspecified: Secondary | ICD-10-CM

## 2019-03-01 DIAGNOSIS — I10 Essential (primary) hypertension: Secondary | ICD-10-CM | POA: Diagnosis present

## 2019-03-01 DIAGNOSIS — F17210 Nicotine dependence, cigarettes, uncomplicated: Secondary | ICD-10-CM | POA: Diagnosis present

## 2019-03-01 DIAGNOSIS — I160 Hypertensive urgency: Secondary | ICD-10-CM | POA: Diagnosis present

## 2019-03-01 DIAGNOSIS — R61 Generalized hyperhidrosis: Secondary | ICD-10-CM | POA: Diagnosis not present

## 2019-03-01 DIAGNOSIS — N179 Acute kidney failure, unspecified: Secondary | ICD-10-CM | POA: Diagnosis present

## 2019-03-01 DIAGNOSIS — R29810 Facial weakness: Secondary | ICD-10-CM | POA: Diagnosis present

## 2019-03-01 DIAGNOSIS — I119 Hypertensive heart disease without heart failure: Secondary | ICD-10-CM

## 2019-03-01 DIAGNOSIS — R4182 Altered mental status, unspecified: Secondary | ICD-10-CM | POA: Diagnosis present

## 2019-03-01 DIAGNOSIS — Z79899 Other long term (current) drug therapy: Secondary | ICD-10-CM | POA: Diagnosis not present

## 2019-03-01 DIAGNOSIS — I6300 Cerebral infarction due to thrombosis of unspecified precerebral artery: Secondary | ICD-10-CM | POA: Diagnosis not present

## 2019-03-01 DIAGNOSIS — R9431 Abnormal electrocardiogram [ECG] [EKG]: Secondary | ICD-10-CM | POA: Diagnosis not present

## 2019-03-01 DIAGNOSIS — I2510 Atherosclerotic heart disease of native coronary artery without angina pectoris: Secondary | ICD-10-CM | POA: Diagnosis present

## 2019-03-01 DIAGNOSIS — I361 Nonrheumatic tricuspid (valve) insufficiency: Secondary | ICD-10-CM

## 2019-03-01 DIAGNOSIS — I69398 Other sequelae of cerebral infarction: Secondary | ICD-10-CM | POA: Diagnosis not present

## 2019-03-01 DIAGNOSIS — R404 Transient alteration of awareness: Secondary | ICD-10-CM | POA: Diagnosis not present

## 2019-03-01 DIAGNOSIS — N390 Urinary tract infection, site not specified: Secondary | ICD-10-CM | POA: Diagnosis present

## 2019-03-01 DIAGNOSIS — H47619 Cortical blindness, unspecified side of brain: Secondary | ICD-10-CM | POA: Diagnosis present

## 2019-03-01 DIAGNOSIS — I6340 Cerebral infarction due to embolism of unspecified cerebral artery: Secondary | ICD-10-CM | POA: Diagnosis present

## 2019-03-01 DIAGNOSIS — F10239 Alcohol dependence with withdrawal, unspecified: Secondary | ICD-10-CM | POA: Diagnosis present

## 2019-03-01 DIAGNOSIS — Z9181 History of falling: Secondary | ICD-10-CM | POA: Diagnosis not present

## 2019-03-01 DIAGNOSIS — I69354 Hemiplegia and hemiparesis following cerebral infarction affecting left non-dominant side: Secondary | ICD-10-CM | POA: Diagnosis not present

## 2019-03-01 DIAGNOSIS — Z7982 Long term (current) use of aspirin: Secondary | ICD-10-CM | POA: Diagnosis not present

## 2019-03-01 DIAGNOSIS — R778 Other specified abnormalities of plasma proteins: Secondary | ICD-10-CM | POA: Diagnosis present

## 2019-03-01 DIAGNOSIS — E785 Hyperlipidemia, unspecified: Secondary | ICD-10-CM | POA: Diagnosis present

## 2019-03-01 DIAGNOSIS — G40909 Epilepsy, unspecified, not intractable, without status epilepticus: Secondary | ICD-10-CM | POA: Diagnosis present

## 2019-03-01 DIAGNOSIS — Z955 Presence of coronary angioplasty implant and graft: Secondary | ICD-10-CM | POA: Diagnosis not present

## 2019-03-01 DIAGNOSIS — H53462 Homonymous bilateral field defects, left side: Secondary | ICD-10-CM | POA: Diagnosis present

## 2019-03-01 DIAGNOSIS — G9341 Metabolic encephalopathy: Secondary | ICD-10-CM | POA: Diagnosis present

## 2019-03-01 DIAGNOSIS — I248 Other forms of acute ischemic heart disease: Secondary | ICD-10-CM | POA: Diagnosis present

## 2019-03-01 DIAGNOSIS — R4781 Slurred speech: Secondary | ICD-10-CM | POA: Diagnosis present

## 2019-03-01 LAB — ECHOCARDIOGRAM COMPLETE
Height: 67 in
Weight: 2687.85 oz

## 2019-03-01 LAB — CREATININE, URINE, RANDOM: Creatinine, Urine: 214.23 mg/dL

## 2019-03-01 LAB — URINALYSIS, ROUTINE W REFLEX MICROSCOPIC
Bilirubin Urine: NEGATIVE
Glucose, UA: NEGATIVE mg/dL
Ketones, ur: NEGATIVE mg/dL
Nitrite: NEGATIVE
Protein, ur: 100 mg/dL — AB
Specific Gravity, Urine: 1.018 (ref 1.005–1.030)
pH: 6 (ref 5.0–8.0)

## 2019-03-01 LAB — CBC
HCT: 41.8 % (ref 39.0–52.0)
Hemoglobin: 13.3 g/dL (ref 13.0–17.0)
MCH: 30.4 pg (ref 26.0–34.0)
MCHC: 31.8 g/dL (ref 30.0–36.0)
MCV: 95.4 fL (ref 80.0–100.0)
NRBC: 0 % (ref 0.0–0.2)
Platelets: 148 10*3/uL — ABNORMAL LOW (ref 150–400)
RBC: 4.38 MIL/uL (ref 4.22–5.81)
RDW: 14.1 % (ref 11.5–15.5)
WBC: 10.5 10*3/uL (ref 4.0–10.5)

## 2019-03-01 LAB — BASIC METABOLIC PANEL
Anion gap: 12 (ref 5–15)
BUN: 14 mg/dL (ref 6–20)
CO2: 22 mmol/L (ref 22–32)
Calcium: 8.5 mg/dL — ABNORMAL LOW (ref 8.9–10.3)
Chloride: 105 mmol/L (ref 98–111)
Creatinine, Ser: 1.46 mg/dL — ABNORMAL HIGH (ref 0.61–1.24)
GFR calc non Af Amer: 54 mL/min — ABNORMAL LOW (ref >60)
Glucose, Bld: 93 mg/dL (ref 70–99)
Potassium: 3.5 mmol/L (ref 3.5–5.1)
SODIUM: 139 mmol/L (ref 135–145)

## 2019-03-01 LAB — SODIUM, URINE, RANDOM: Sodium, Ur: 152 mmol/L

## 2019-03-01 LAB — RAPID URINE DRUG SCREEN, HOSP PERFORMED
AMPHETAMINES: NOT DETECTED
BENZODIAZEPINES: NOT DETECTED
Barbiturates: NOT DETECTED
Cocaine: NOT DETECTED
Opiates: NOT DETECTED
TETRAHYDROCANNABINOL: POSITIVE — AB

## 2019-03-01 LAB — HEPARIN LEVEL (UNFRACTIONATED): Heparin Unfractionated: 0.43 IU/mL (ref 0.30–0.70)

## 2019-03-01 LAB — LEVETIRACETAM LEVEL: Levetiracetam Lvl: 19 ug/mL (ref 10.0–40.0)

## 2019-03-01 LAB — TROPONIN I
TROPONIN I: 1.04 ng/mL — AB (ref <0.03)
Troponin I: 1.07 ng/mL (ref <0.03)
Troponin I: 0.79 ng/mL (ref <0.03)

## 2019-03-01 LAB — PROTIME-INR
INR: 1.3 — AB (ref 0.8–1.2)
PROTHROMBIN TIME: 16.1 s — AB (ref 11.4–15.2)

## 2019-03-01 LAB — PHOSPHORUS: Phosphorus: 3.7 mg/dL (ref 2.5–4.6)

## 2019-03-01 LAB — MAGNESIUM: Magnesium: 1.5 mg/dL — ABNORMAL LOW (ref 1.7–2.4)

## 2019-03-01 LAB — TSH: TSH: 1.793 u[IU]/mL (ref 0.350–4.500)

## 2019-03-01 MED ORDER — STROKE: EARLY STAGES OF RECOVERY BOOK
Freq: Once | Status: AC
  Administered 2019-03-01: 14:00:00

## 2019-03-01 MED ORDER — HEPARIN BOLUS VIA INFUSION
4000.0000 [IU] | Freq: Once | INTRAVENOUS | Status: AC
  Administered 2019-03-01: 4000 [IU] via INTRAVENOUS
  Filled 2019-03-01: qty 4000

## 2019-03-01 MED ORDER — MAGNESIUM SULFATE 2 GM/50ML IV SOLN
2.0000 g | Freq: Once | INTRAVENOUS | Status: AC
  Administered 2019-03-01: 2 g via INTRAVENOUS
  Filled 2019-03-01: qty 50

## 2019-03-01 MED ORDER — LORAZEPAM 2 MG/ML IJ SOLN
0.5000 mg | Freq: Once | INTRAMUSCULAR | Status: AC | PRN
  Administered 2019-03-01: 0.5 mg via INTRAVENOUS
  Filled 2019-03-01: qty 1

## 2019-03-01 MED ORDER — MAGNESIUM OXIDE 400 (241.3 MG) MG PO TABS
400.0000 mg | ORAL_TABLET | Freq: Every day | ORAL | Status: DC
  Administered 2019-03-02 – 2019-03-05 (×4): 400 mg via ORAL
  Filled 2019-03-01 (×4): qty 1

## 2019-03-01 MED ORDER — SODIUM CHLORIDE 0.9 % IV SOLN
Freq: Once | INTRAVENOUS | Status: AC
  Administered 2019-03-01: 15:00:00 via INTRAVENOUS

## 2019-03-01 MED ORDER — SODIUM CHLORIDE 0.9 % IV SOLN
INTRAVENOUS | Status: DC
  Administered 2019-03-01: 09:00:00 via INTRAVENOUS

## 2019-03-01 MED ORDER — AMLODIPINE BESYLATE 10 MG PO TABS
10.0000 mg | ORAL_TABLET | Freq: Every day | ORAL | Status: DC
  Administered 2019-03-01 – 2019-03-05 (×5): 10 mg via ORAL
  Filled 2019-03-01 (×5): qty 1

## 2019-03-01 MED ORDER — HYDRALAZINE HCL 20 MG/ML IJ SOLN: 10.0000 mg | INTRAMUSCULAR | Status: DC | PRN

## 2019-03-01 MED ORDER — CLOPIDOGREL BISULFATE 75 MG PO TABS
75.0000 mg | ORAL_TABLET | Freq: Every day | ORAL | Status: DC
  Administered 2019-03-01 – 2019-03-05 (×5): 75 mg via ORAL
  Filled 2019-03-01 (×5): qty 1

## 2019-03-01 MED ORDER — HEPARIN (PORCINE) 25000 UT/250ML-% IV SOLN
1150.0000 [IU]/h | INTRAVENOUS | Status: DC
  Administered 2019-03-01: 1000 [IU]/h via INTRAVENOUS
  Filled 2019-03-01 (×2): qty 250

## 2019-03-01 NOTE — Progress Notes (Signed)
ANTICOAGULATION CONSULT NOTE - Initial Consult  Pharmacy Consult for Heparin  Indication: elevated troponin  No Known Allergies  Patient Measurements: Height: 5\' 7"  (170.2 cm) Weight: 186 lb 15.2 oz (84.8 kg) IBW/kg (Calculated) : 66.1 Vital Signs: Temp: 98.2 F (36.8 C) (03/30 0006) Temp Source: Oral (03/30 0006) BP: 159/83 (03/30 0006) Pulse Rate: 56 (03/30 0006)  Labs: Recent Labs    02/28/19 1641  HGB 14.6  HCT 45.7  PLT 182  CREATININE 1.62*  TROPONINI 0.91*    Estimated Creatinine Clearance: 54.3 mL/min (A) (by C-G formula based on SCr of 1.62 mg/dL (H)).   Medical History: Past Medical History:  Diagnosis Date  . CAD (coronary artery disease)    s/p PCI with stents x 3  . Chronic alcohol abuse   . CVA (cerebral vascular accident) ([place])    multiple  . Hypertension   . Seizures ([place])   . Tobacco abuse    Assessment: 53 y/o M with elevated troponin, starting IV heparin while cycling troponin, CBC good, renal function good, PTA meds reviewed.   Goal of Therapy:  Heparin level 0.3-0.7 units/ml Monitor platelets by anticoagulation protocol: Yes   Plan:  Heparin 4000 units BOLUS Start heparin drip at 1000 units/hr 0900 HL Daily CBC/HL Monitor for bleeding   Narda Bonds 03/01/2019,12:32 AM

## 2019-03-01 NOTE — Progress Notes (Signed)
Progress Note    [name]  OZD:[id number] DOB: [birth year]  DOA: 02/28/2019 PCP: Imagene Riches, NP    Brief Narrative:   Chief complaint: Confusion  Medical records reviewed and are as summarized below:  Douglas Fowler is an 53 y.o. male with pmh of  CAD s/p PCI with stents x 3, multiple ischemic CVA's, HTN, seizures, tobacco abuse, chronic alcohol abuse, who is admitted to [facility] on 02/28/19 with acute encephalopathy after from from home by way of EMS to [facility] ED for evaluation of confusion.    Assessment/Plan:   Principal Problem:   Acute encephalopathy Active Problems:   CVA (cerebral vascular accident) ([place])   Essential hypertension   AKI (acute kidney injury) ([place])   Elevated troponin   Chronic alcohol abuse   Acute encephalopathy: Patient presented with confusion only oriented to person.  Alcohol level undetectable. Suspect symptoms could be related with an acute stroke seen on MRI. -Follow-up urine drug screen   -May warrant EEG  Embolic CVA, history of CVA with residual weakness: Acute.  Patient with previous history of CVA with left-sided weakness.  CT scan did not show any acute abnormalities, but MRI of the brain revealed multifocal areas of acute nonhemorrhagic infarction evolving multiple vascular territories compatible with a central embolic source.  Echocardiogram revealed EF of 55 to 60% with normal diastolic dysfunction.  Previously noted to have cardioembolic source -Stroke order-set initiated  -Neurochecks -ASA and statin -PT/OT to evaluate and treat   -Vascular Doppler ultrasound -Dr. Cheral Marker neurology consulted, will follow-up for further recommendation -Will need to discuss need of TEE with cardiology  NSTEMI:Acute. Troponin elevated at 0.91-> 1.07.  Patient without complaints of chest pain. -Continue heparin drip per pharmacy -Continue to trend troponin -Appreciate cardiology consultative services, will follow for further  recommendation  Alcohol abuse, history of seizure: Patient with previous history of alcohol abuse and suspected withdrawal seizures, but neurology had recommended long-term AED during his last admission in 12/2018.  Alcohol level was noted to be undetectable. -CIWA protocols -Continue Keppra  Hypertensive urgency: Acute.  Blood pressures noted to be elevated up to 225/85 on admission.   -Continue Coreg and amlodipine -Hydralazine IV as needed  Acute kidney injury: Resolving: Patient presents with elevated creatinine up to 1.62 with BUN within normal limits on admission.  Creatinine trending downward after patient given IV fluids.  Question if symptoms or prerenal given patient had been outside in the heat for last 2 days prior to admission versus uncontrolled blood pressure. -Continue IV fluids -Check FeNa -Recheck BMP in a.m.  Dyslipidemia: Last LDL 104.  Patient currently taking high-dose statin at home.  Goal LDL < 70. -Follow-up repeat lipid panel -Continue atorvastatin  Hypomagnesemia: Acute on chronic.  Magnesium noted to be 1.5. -Give 2 g of magnesium sulfate IV -Start daily magnesium oxide supplementation tomorrow -Continue to monitor and replace as needed   Body mass index is 26.31 kg/m.   Family Communication/Anticipated D/C date and plan/Code Status   DVT prophylaxis: Heparin Code Status: Full Code.  Family Communication: No family present at bedside Disposition Plan: To be determined   Medical Consultants:    Cardiology and neurology   Anti-Infectives:    None  Subjective:   Patient does not know why he is here.    Objective:    Vitals:   03/01/19 0310 03/01/19 0500 03/01/19 0828 03/01/19 0835  BP: (!) 163/84  (!) 188/89 (!) 200/65  Pulse: 65  66 (!) 54  Resp: 20  18 18   Temp: 98.3 F (36.8 C)  98 F (36.7 C) 98 F (36.7 C)  TempSrc: Oral  Oral Oral  SpO2: 98%  (!) 85% 95%  Weight:  76.2 kg    Height:       No intake or output data in  the 24 hours ending 03/01/19 1039 Filed Weights   02/28/19 1509 03/01/19 0500  Weight: 84.8 kg 76.2 kg    Exam: Constitutional: Middle-age male in NAD, calm, comfortable Eyes: PERRL, lids and conjunctivae normal ENMT: Mucous membranes are moist. Posterior pharynx clear of any exudate or lesions. .  Neck: normal, supple, no masses, no thyromegaly Respiratory: clear to auscultation bilaterally, no wheezing, no crackles. Normal respiratory effort. No accessory muscle use.  Cardiovascular: Regular rate and rhythm, no murmurs / rubs / gallops. No extremity edema. 2+ pedal pulses. No carotid bruits.  Abdomen: no tenderness, no masses palpated. No hepatosplenomegaly. Bowel sounds positive.  Musculoskeletal: no clubbing / cyanosis. No joint deformity upper and lower extremities. Good ROM, no contractures. Normal muscle tone.  Skin: no rashes, lesions, ulcers. No induration Neurologic: CN 2-12 grossly intact. Sensation intact, DTR normal. Strength 5/5 in right upper and lower extremities.  4/5 in left upper and lower extremities. Psychiatric: Normal judgment and insight. Alert and oriented x person. Normal mood.    Data Reviewed:   I have personally reviewed following labs and imaging studies:  Labs: Labs show the following:   Basic Metabolic Panel: Recent Labs  Lab 02/28/19 1641 03/01/19 0217  NA 139 139  K 3.8 3.5  CL 103 105  CO2 24 22  GLUCOSE 103* 93  BUN 15 14  CREATININE 1.62* 1.46*  CALCIUM 9.5 8.5*  MG  --  1.5*  PHOS  --  3.7   GFR Estimated Creatinine Clearance: 54.1 mL/min (A) (by C-G formula based on SCr of 1.46 mg/dL (H)). Liver Function Tests: Recent Labs  Lab 02/28/19 1641  AST 24  ALT 11  ALKPHOS 98  BILITOT 0.9  PROT 6.7  ALBUMIN 3.5   No results for input(s): LIPASE, AMYLASE in the last 168 hours. Recent Labs  Lab 02/28/19 1641  AMMONIA 27   Coagulation profile Recent Labs  Lab 03/01/19 0217  INR 1.3*    CBC: Recent Labs  Lab  02/28/19 1641 03/01/19 0217  WBC 11.7* 10.5  NEUTROABS 9.7*  --   HGB 14.6 13.3  HCT 45.7 41.8  MCV 94.2 95.4  PLT 182 148*   Cardiac Enzymes: Recent Labs  Lab 02/28/19 1641 03/01/19 0217  TROPONINI 0.91* 1.07*   BNP (last 3 results) No results for input(s): PROBNP in the last 8760 hours. CBG: Recent Labs  Lab 02/28/19 1642  GLUCAP 93   D-Dimer: No results for input(s): DDIMER in the last 72 hours. Hgb A1c: No results for input(s): HGBA1C in the last 72 hours. Lipid Profile: No results for input(s): CHOL, HDL, LDLCALC, TRIG, CHOLHDL, LDLDIRECT in the last 72 hours. Thyroid function studies: Recent Labs    03/01/19 0217  TSH 1.793   Anemia work up: No results for input(s): VITAMINB12, FOLATE, FERRITIN, TIBC, IRON, RETICCTPCT in the last 72 hours. Sepsis Labs: Recent Labs  Lab 02/28/19 1641 03/01/19 0217  WBC 11.7* 10.5    Microbiology No results found for this or any previous visit (from the past 240 hour(s)).  Procedures and diagnostic studies:  Dg Chest 2 View  Result Date: 02/28/2019 CLINICAL DATA:  Acute mental status  change EXAM: CHEST - 2 VIEW COMPARISON:  December 10, 2018 FINDINGS: Stable cardiomegaly. The hila, mediastinum, lungs, and pleura are otherwise unremarkable. IMPRESSION: No acute abnormalities.  Stable cardiomegaly. Electronically Signed   By: Dorise Bullion III M.D   On: 02/28/2019 16:17   Ct Head Wo Contrast  Result Date: 02/28/2019 CLINICAL DATA:  Altered mental status. 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 EXAM: CT HEAD WITHOUT CONTRAST TECHNIQUE: Contiguous axial images were obtained from the base of the skull through the vertex without intravenous contrast. COMPARISON:  12/15/2018 FINDINGS: Brain: No acute finding by CT. The brainstem shows wall air in degeneration on the right. Few old small vessel cerebellar infarctions. The right cerebral hemisphere shows extensive old ischemic changes throughout the right middle cerebral  artery territory. Ischemic changes are present within the basal ganglia. No sign of acute extension. Left hemisphere shows old small vessel infarctions in the basal ganglia and small-vessel changes of the white matter. There is a small region of old ischemic change in the left parieto-occipital cortical and subcortical brain. Vascular: There is atherosclerotic calcification of the major vessels at the base of the brain. Skull: Negative Sinuses/Orbits: Clear/normal Other: None IMPRESSION: No acute finding by CT. Extensive old ischemic changes in the right hemisphere with atrophy, encephalomalacia and gliosis. Old small vessel infarctions in both basal ganglia and in the left cerebellum. Old left parieto-occipital cortical and subcortical infarction. Electronically Signed   By: Nelson Chimes M.D.   On: 02/28/2019 16:03   Mr Brain Wo Contrast  Result Date: 03/01/2019 CLINICAL DATA:  Coronary artery disease.  Encephalopathy. EXAM: MRI HEAD WITHOUT CONTRAST TECHNIQUE: Multiplanar, multiecho pulse sequences of the brain and surrounding structures were obtained without intravenous contrast. COMPARISON:  CT head without contrast 02/28/2019 FINDINGS: Brain: Multifocal infarcts are present. There are patchy areas of acute cortical nonhemorrhagic infarction over the right MCA territory. There is involvement of the left occipital lobe in the left PCA territory. There is also scattered involvement of the right MCA territory, to a lesser extent than left. Areas of remote encephalomalacia involving the right MCA territory right occipital lobe are again noted. Remote lacunar infarcts are present in basal ganglia bilaterally. The ventricles are of proportionate to the degree of atrophy. The study is moderately degraded patient motion. Vascular: Flow is present in the major intracranial arteries. Skull and upper cervical spine: The craniocervical junction is normal. Upper cervical spine is within normal limits. Marrow signal is  unremarkable. Sinuses/Orbits: The paranasal sinuses and mastoid air cells are clear. The globes and orbits are within normal limits. The IMPRESSION: 1. New multifocal areas of acute nonhemorrhagic infarction involving multiple vascular territories compatible suggesting a central embolic source. 2. Remote encephalomalacia of the right MCA and PCA territories. 3. Remote lacunar infarcts of the basal ganglia. Electronically Signed   By: San Morelle M.D.   On: 03/01/2019 10:53    Medications:    amLODipine  10 mg Oral Daily   aspirin  325 mg Oral Daily   atorvastatin  80 mg Oral q1800   carvedilol  6.25 mg Oral BID WC   folic acid  1 mg Oral Daily   levETIRAcetam  500 mg Oral BID   multivitamin  1 tablet Oral Daily   thiamine  100 mg Oral Daily   Continuous Infusions:  sodium chloride 75 mL/hr at 03/01/19 0849   heparin 1,000 Units/hr (03/01/19 0204)     LOS: 0 days   Giorgi Debruin A Nashaly Dorantes  [facility]   *  Please refer to [email or web address], password [email or web address] to get updated schedule on who will round on this patient, as hospitalists switch teams weekly. If 7PM-7AM, please contact night-coverage at [email or web address], password [email or web address] for any overnight needs.

## 2019-03-01 NOTE — Consult Note (Addendum)
Neurology Consultation  Reason for Consult: Multiple embolic strokes/confusion  Referring Physician: Dr. Tamala Julian  CC: Acute strokes seen on MRI  History is obtained from: Chart- Patient is extremely lethargic as he has just received Ativan for MRI.  Patient has a very difficult time staying awake during initial assessment by PA. On follow up exam by attending, he is more alert and able to ambulate with a walker and contact guard.   HPI: Douglas Fowler is a 53 y.o. male with a past medical history of seizures, tobacco abuse, hypertension, CVA, CAD and chronic alcohol abuse.  Patient was initially brought [facility] the [facility] ED via EMS due to confusion.  Majority of the history was obtained from wife by the ED physician.  Wife states that he was with his normal mental status until approximately 1430 in the afternoon on 3/29.  Per wife she had left the patient for approximately 15 minutes and upon return he was confused.  Per ED note there was no tonic-clonic activity, tongue biting, or loss of bowel or bladder function.  She notes that he typically drinks about 1/3 gallon per day of whiskey and has not had any alcohol consumption in the last 3 days.   As an outpatient, he is on Keppra 500 mg twice daily, atorvastatin 80 mg orally every 18 hours, aspirin 325 mg orally daily.  Currently patient is extremely drowsy and hard to keep awake.  He does follow mild commands but cannot give me a good history.   ED course : Vital signs:Heart rate 77, T-max 98.9, blood pressure 170/88.  CT head showed no acute findings.  Extensive old ischemic changes in the right hemisphere with atrophy, encephalomalacia and gliosis.  Old small vessel infarcts in both basal ganglia and left cerebellum.  Old left parieto-occipital cortical and subcortical infarcts   Chart review (prior neurology notes/discharge): Patient does have a history of CVA back in 2017 from which he developed slurred speech and at that time was noted to  have 3 strokes.  His main symptoms were progressive loss of vision over 2 weeks and slurred speech.  On exam at that time he had a left hemianopsia and weakness of his left arm and leg.  Patient showed multifocal areas of infarction on the MRI including cerebellar and bilateral occipital lobes, bilateral frontal lobes.   LKW: Per wife 1430 on 02/28/2019 tpa given?: no, no window Premorbid modified Rankin scale (mRS): 0 NIH stroke score of 3 however this was difficult to ascertain secondary to patient being significantly drowsy and falling asleep during exam   ROS: Unable to obtain due to altered mental status.   Past Medical History:  Diagnosis Date  . CAD (coronary artery disease)    s/p PCI with stents x 3  . Chronic alcohol abuse   . CVA (cerebral vascular accident) ([place])    multiple  . Hypertension   . Seizures ([place])   . Tobacco abuse      Family History  Family history unknown: Yes   Social History:   reports that he has been smoking. He has a 5.00 pack-year smoking history. He has never used smokeless tobacco. He reports current alcohol use. He reports previous drug use.  Medications  Current Facility-Administered Medications:  .   stroke: mapping our early stages of recovery book, , Does not apply, Once, Smith, Rondell A, MD .  0.9 %  sodium chloride infusion, , Intravenous, Continuous, Smith, Rondell A, MD, Last Rate: 75 mL/hr at 03/01/19  0849 .  acetaminophen (TYLENOL) tablet 650 mg, 650 mg, Oral, Q6H PRN, Howerter, Justin B, DO .  amLODipine (NORVASC) tablet 10 mg, 10 mg, Oral, Daily, Reino Bellis B, NP, 10 mg at 03/01/19 1123 .  aspirin tablet 325 mg, 325 mg, Oral, Daily, Howerter, Justin B, DO, 325 mg at 03/01/19 1120 .  atorvastatin (LIPITOR) tablet 80 mg, 80 mg, Oral, q1800, Howerter, Justin B, DO, 80 mg at 02/28/19 2116 .  carvedilol (COREG) tablet 6.25 mg, 6.25 mg, Oral, BID WC, Howerter, Justin B, DO, 6.25 mg at 03/01/19 0844 .  folic acid (FOLVITE) tablet  1 mg, 1 mg, Oral, Daily, Howerter, Justin B, DO, 1 mg at 03/01/19 1120 .  heparin ADULT infusion 100 units/mL (25000 units/247mL sodium chloride 0.45%), 1,000 Units/hr, Intravenous, Continuous, Erenest Blank, RPH, Last Rate: 10 mL/hr at 03/01/19 0204, 1,000 Units/hr at 03/01/19 0204 .  levETIRAcetam (KEPPRA) tablet 500 mg, 500 mg, Oral, BID, Howerter, Justin B, DO, 500 mg at 03/01/19 1120 .  LORazepam (ATIVAN) tablet 1 mg, 1 mg, Oral, Q6H PRN **OR** LORazepam (ATIVAN) injection 1 mg, 1 mg, Intravenous, Q6H PRN, Howerter, Justin B, DO .  LORazepam (ATIVAN) injection 1 mg, 1 mg, Intravenous, Q1H PRN, Howerter, Justin B, DO .  [START ON 03/02/2019] magnesium oxide (MAG-OX) tablet 400 mg, 400 mg, Oral, Daily, Smith, Rondell A, MD .  magnesium sulfate IVPB 2 g 50 mL, 2 g, Intravenous, Once, Smith, Rondell A, MD .  multivitamin (PROSIGHT) tablet 1 tablet, 1 tablet, Oral, Daily, Howerter, Justin B, DO, 1 tablet at 03/01/19 1120 .  nicotine (NICODERM CQ - dosed in mg/24 hours) patch 21 mg, 21 mg, Transdermal, Daily PRN, Howerter, Justin B, DO .  ondansetron (ZOFRAN) tablet 4 mg, 4 mg, Oral, Q6H PRN, 4 mg at 03/01/19 1120 **OR** ondansetron (ZOFRAN) injection 4 mg, 4 mg, Intravenous, Q6H PRN, Howerter, Justin B, DO .  thiamine (VITAMIN B-1) tablet 100 mg, 100 mg, Oral, Daily, Howerter, Justin B, DO, 100 mg at 03/01/19 1120   Exam: Current vital signs: BP (!) 184/80 (BP Location: Left Arm)   Pulse (!) 56   Temp 98 F (36.7 C) (Oral)   Resp 18   Ht 5\' 7"  (1.702 m)   Wt 76.2 kg   SpO2 100%   BMI 26.31 kg/m  Vital signs in last 24 hours: Temp:  [98 F (36.7 C)-98.9 F (37.2 C)] 98 F (36.7 C) (03/30 1159) Pulse Rate:  [54-75] 56 (03/30 1159) Resp:  [18-21] 18 (03/30 1159) BP: (159-225)/(65-89) 184/80 (03/30 1159) SpO2:  [85 %-100 %] 100 % (03/30 1159) Weight:  [76.2 kg-84.8 kg] 76.2 kg (03/30 0500)  Physical Exam  Constitutional: Appears well-developed and well-nourished.  Psych: Affect  appropriate to situation Eyes: No scleral injection HENT: No OP obstrucion Head: Normocephalic.  Cardiovascular: Normal rate and regular rhythm.  Respiratory: Effort normal, non-labored breathing GI: Soft.  No distension. There is no tenderness.  Skin: WDI  Neuro: Mental Status: Patient currently is very drowsy, he does awaken easily with verbal stimuli however will fall asleep if not continually aroused.  He does know that he is at [facility] however with naming objects he has a difficult time in addition he has a difficult time with simple one and two-step commands.  Much of this is due to his significant drowsiness.  Cranial Nerves: II: Visual Fields again hard to obtain as patient would fall asleep and cannot give me a full visual field exam.  It does appear  that he has a left visual field cut and possibly a upper lateral right quadrant field cut.  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 Pupils are equal, round, and reactive to light.   V: Facial sensation is symmetric to temperature and light touch per patient VII: Facial movement appears to have a right facial droop VIII: hearing is intact to voice X: Uvula elevates symmetrically XI: Shoulder shrug is symmetric. XII: tongue is midline without atrophy or fasciculations.  Motor: Tone is normal. Bulk is normal. 5/5 strength was present in all four extremities.  Sensory: Sensation is symmetric to light touch and temperature in the arms and legs. Deep Tendon Reflexes: 2+ and symmetric in the biceps and patellae. Plantars: Toes are downgoing bilaterally.  Cerebellar: FNF and HKS are intact bilaterally  Labs I have reviewed labs in epic and the results pertinent to this consultation are:   CBC    Component Value Date/Time   WBC 10.5 03/01/2019 0217   RBC 4.38 03/01/2019 0217   HGB 13.3 03/01/2019 0217   HCT 41.8  03/01/2019 0217   PLT 148 (L) 03/01/2019 0217   MCV 95.4 03/01/2019 0217   MCH 30.4 03/01/2019 0217   MCHC 31.8 03/01/2019 0217   RDW 14.1 03/01/2019 0217   LYMPHSABS 0.9 02/28/2019 1641   MONOABS 0.8 02/28/2019 1641   EOSABS 0.1 02/28/2019 1641   BASOSABS 0.1 02/28/2019 1641    CMP     Component Value Date/Time   NA 139 03/01/2019 0217   K 3.5 03/01/2019 0217   CL 105 03/01/2019 0217   CO2 22 03/01/2019 0217   GLUCOSE 93 03/01/2019 0217   BUN 14 03/01/2019 0217   CREATININE 1.46 (H) 03/01/2019 0217   CALCIUM 8.5 (L) 03/01/2019 0217   PROT 6.7 02/28/2019 1641   ALBUMIN 3.5 02/28/2019 1641   AST 24 02/28/2019 1641   ALT 11 02/28/2019 1641   ALKPHOS 98 02/28/2019 1641   BILITOT 0.9 02/28/2019 1641   GFRNONAA 54 (L) 03/01/2019 0217   GFRAA >60 03/01/2019 0217    Lipid Panel     Component Value Date/Time   CHOL 175 08/05/2018 1004   TRIG 167 (H) 08/05/2018 1004   HDL 38 (L) 08/05/2018 1004   CHOLHDL 4.6 08/05/2018 1004   VLDL 33 08/05/2018 1004   LDLCALC 104 (H) 08/05/2018 1004     Imaging I have reviewed the images obtained:  Echocardiogram-left ventricular has normal systolic function, with an ejection fraction of 55-60%.  The cavity size is normal.  Left ventricular diastolic parameters were normal.  CT-scan of the brain: No acute findings by CT  MRI examination of the brain- new multifocal areas of acute nonhemorrhagic infarct involving multiple vascular territories compatible suggesting of a central embolic source.  Remote encephalomalacia of the right MCA and PCA territories.  Remote lacunar infarcts in the basal ganglia.  Carotid Dopplers pending  LDL, lipid panel, hemoglobin A1c pending  Etta Quill PA-C [facility] [phone]  M-F  (9:00 am- 5:00 PM)  03/01/2019, 1:49 PM   CTA head and neck, 08/06/18:  1. 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 Little to no collateral flow seen distally within the right  MCA territory by CTA. 2. 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 Moderate approximate 50% atheromatous stenosis at the left V4 segment. Dominant left vertebral artery and remainder the vertebrobasilar circulation otherwise patent.  3. Moderate carotid siphon atherosclerotic change with associated moderate to advanced multifocal stenoses. 4. Short-segment 50% stenosis at the origin of the left CCA. 5. Short-segment 60% stenosis involving the proximal left subclavian artery, distal to the origin of the left vertebral artery. .  Assessment: 53 year old male presenting with new onset of confusion. MRI reveals multiple acute/subacute strokes in separate vascular territories, concerning for cardioembolic source. 1. DDx for confusion most likely multifactorial, with new on old strokes, probable initial stages of EtOH withdrawal and cortical blindness as primary contributing factors.  2. Stroke risk factors: Prior strokes, HTN and CAD 3. He has a history of seizures and alcohol abuse. His last drink was 3 days ago and per wife he consumes 1/3 gallon hard liquor daily at baseline.  TTE from today:   1. The left ventricle has normal systolic function, with an ejection fraction of 55-60%. The cavity size was normal. Left ventricular diastolic parameters were normal.  2. The right ventricle has normal systolic function. The cavity was normal. There is no increase in right ventricular wall thickness.  3. Mild thickening of the mitral valve leaflet.  4. The aortic valve is tricuspid. Mild thickening of the aortic valve. Mild calcification of the aortic valve. Aortic valve regurgitation is moderate by color flow Doppler   Recommendations: - Finish stroke work-up which has been initiated.  Awaiting hemoglobin A1c, fasting lipid panel, carotid Dopplers.  Has had a CTA of head and neck within the last 12 months.  -- Cardiac telemetry -- TTE shows no mural thrombus. No PFO mentioned in  the report.  -- Patient likely also will now need to have TEE.   -- At this time would continue aspirin and add Plavix. Stroke team to consider switching the patient from the newly started Plavix to anticoagulation after 3-7 days given the sizes of two of the acute infarcts (one in left frontal operculum and one in left occipital lobe), which present a risk for hemorrhagic transformation. -- DDx for cardioembolic infarctions includes atrial fibrillation, cardiac mural thrombus, paradoxical embolism and embolic source from aortic arch -- CIWA protocol  -- Continue Keppra   I have interviewed and examined the patient. 53 year old male presenting with multiple acute/subacute ischemic infarctions in separate vascular territories. Exam reveals motor deficits, gait instability and cortical blindness. Plan includes addition of Plavix to ASA and complete stroke work up.  Electronically signed: Dr. Kerney Elbe

## 2019-03-01 NOTE — Progress Notes (Signed)
CSW acknowledges consult for SNF placement. We are awaiting PT/OT recommendations.   CSW will assist once, we have recommendations in place for discharge planning.   Domenic Schwab, MSW, [place]

## 2019-03-01 NOTE — Progress Notes (Signed)
ANTICOAGULATION CONSULT NOTE - Initial Consult  Pharmacy Consult for Heparin  Indication: elevated troponin  No Known Allergies  Patient Measurements: Height: 5\' 7"  (170.2 cm) Weight: 167 lb 15.9 oz (76.2 kg) IBW/kg (Calculated) : 66.1 Vital Signs: Temp: 98 F (36.7 C) (03/30 0835) Temp Source: Oral (03/30 0835) BP: 200/65 (03/30 0835) Pulse Rate: 54 (03/30 0835)  Labs: Recent Labs    02/28/19 1641 03/01/19 0217 03/01/19 0853  HGB 14.6 13.3  --   HCT 45.7 41.8  --   PLT 182 148*  --   LABPROT  --  16.1*  --   INR  --  1.3*  --   HEPARINUNFRC  --   --  0.43  CREATININE 1.62* 1.46*  --   TROPONINI 0.91* 1.07*  --     Estimated Creatinine Clearance: 54.1 mL/min (A) (by C-G formula based on SCr of 1.46 mg/dL (H)).   Medical History: Past Medical History:  Diagnosis Date  . CAD (coronary artery disease)    s/p PCI with stents x 3  . Chronic alcohol abuse   . 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 Hypertension   . Seizures ([place])   . Tobacco abuse    Assessment: 53 y/o M with elevated troponin, starting IV heparin while cycling troponin, CBC good, renal function good, PTA meds reviewed.   Heparin level come back within range at 0.43.  Goal of Therapy:  Heparin level 0.3-0.7 units/ml Monitor platelets by anticoagulation protocol: Yes   Plan:  Continue heparin drip at 1000 units/hr 1800 HL Daily CBC/HL Monitor for bleeding   Alanda Slim, PharmD, [facility] Clinical Pharmacist Please see AMION for all Pharmacists' Contact Phone Numbers 03/01/2019, 9:34 AM

## 2019-03-01 NOTE — Progress Notes (Signed)
  Echocardiogram 2D Echocardiogram has been performed.  Torrey Horseman L Androw 03/01/2019, 8:33 AM

## 2019-03-01 NOTE — Progress Notes (Addendum)
Progress Note  Patient Name: Douglas Fowler Date of Encounter: 03/01/2019  Primary Cardiologist: No primary care provider on file.  Previously saw Dr. Agustin Cree (2017)  Subjective   Pleasantly confused.  Thinks it is 36 and he is in [facility]. No complaints of chest pain or pressure.  No complaints of dyspnea.  Inpatient Medications    Scheduled Meds: . aspirin  325 mg Oral Daily  . atorvastatin  80 mg Oral q1800  . carvedilol  6.25 mg Oral BID WC  . folic acid  1 mg Oral Daily  . levETIRAcetam  500 mg Oral BID  . multivitamin  1 tablet Oral Daily  . thiamine  100 mg Oral Daily   Continuous Infusions: . sodium chloride 75 mL/hr at 03/01/19 0849  . heparin 1,000 Units/hr (03/01/19 0204)   PRN Meds: acetaminophen, LORazepam **OR** LORazepam, LORazepam, nicotine, ondansetron **OR** ondansetron (ZOFRAN) IV   Vital Signs    Vitals:   03/01/19 0310 03/01/19 0500 03/01/19 0828 03/01/19 0835  BP: (!) 163/84  (!) 188/89 (!) 200/65  Pulse: 65  66 (!) 54  Resp: 20  18 18   Temp: 98.3 F (36.8 C)  98 F (36.7 C) 98 F (36.7 C)  TempSrc: Oral  Oral Oral  SpO2: 98%  (!) 85% 95%  Weight:  76.2 kg    Height:       No intake or output data in the 24 hours ending 03/01/19 0932 Last 3 Weights 03/01/2019 02/28/2019 12/18/2018  Weight (lbs) 167 lb 15.9 oz 186 lb 15.2 oz 187 lb  Weight (kg) 76.2 kg 84.8 kg 84.823 kg      Telemetry    Sinus bradycardia rates in the 50 to 60 bpm range- Personally Reviewed  ECG    No new EKG today.- Personally Reviewed  Physical Exam   GEN:  Resting comfortably in bed no acute distress.  Quite confused.  Thinks is 1997 that he is in [place].  Alert oriented to himself. HEENT: Very poor dentition. Neck: No JVD Cardiac: RRR (bradycardia), soft 1/6 SEM at RUSB.  No rubs, or gallops.  Respiratory: Clear to auscultation bilaterally.  Nonlabored.  Good air movement GI: Soft, nontender, non-distended  MS: No edema; No deformity.  Neuro:   Alert and oriented only to himself. Psych: Little bit confused  Labs    Chemistry Recent Labs  Lab 02/28/19 1641 03/01/19 0217  NA 139 139  K 3.8 3.5  CL 103 105  CO2 24 22  GLUCOSE 103* 93  BUN 15 14  CREATININE 1.62* 1.46*  CALCIUM 9.5 8.5*  PROT 6.7  --   ALBUMIN 3.5  --   AST 24  --   ALT 11  --   ALKPHOS 98  --   BILITOT 0.9  --   GFRNONAA 47* 54*  GFRAA 55* >60  ANIONGAP 12 12     Hematology Recent Labs  Lab 02/28/19 1641 03/01/19 0217  WBC 11.7* 10.5  RBC 4.85 4.38  HGB 14.6 13.3  HCT 45.7 41.8  MCV 94.2 95.4  MCH 30.1 30.4  MCHC 31.9 31.8  RDW 14.2 14.1  PLT 182 148*    Cardiac Enzymes Recent Labs  Lab 02/28/19 1641 03/01/19 0217  TROPONINI 0.91* 1.07*   No results for input(s): TROPIPOC in the last 168 hours.   BNPNo results for input(s): BNP, PROBNP in the last 168 hours.   DDimer No results for input(s): DDIMER in the last 168 hours.   Radiology  Dg Chest 2 View  Result Date: 02/28/2019 CLINICAL DATA:  Acute mental status change EXAM: CHEST - 2 VIEW COMPARISON:  December 10, 2018 FINDINGS: Stable cardiomegaly. The hila, mediastinum, lungs, and pleura are otherwise unremarkable. IMPRESSION: No acute abnormalities.  Stable cardiomegaly. Electronically Signed   By: Dorise Bullion III M.D   On: 02/28/2019 16:17   Ct Head Wo Contrast  Result Date: 02/28/2019 CLINICAL DATA:  Altered mental status. 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 EXAM: CT HEAD WITHOUT CONTRAST TECHNIQUE: Contiguous axial images were obtained from the base of the skull through the vertex without intravenous contrast. COMPARISON:  12/15/2018 FINDINGS: Brain: No acute finding by CT. The brainstem shows wall air in degeneration on the right. Few old small vessel cerebellar infarctions. The right cerebral hemisphere shows extensive old ischemic changes throughout the right middle cerebral artery territory. Ischemic changes are present within the basal ganglia.  No sign of acute extension. Left hemisphere shows old small vessel infarctions in the basal ganglia and small-vessel changes of the white matter. There is a small region of old ischemic change in the left parieto-occipital cortical and subcortical brain. Vascular: There is atherosclerotic calcification of the major vessels at the base of the brain. Skull: Negative Sinuses/Orbits: Clear/normal Other: None IMPRESSION: No acute finding by CT. Extensive old ischemic changes in the right hemisphere with atrophy, encephalomalacia and gliosis. Old small vessel infarctions in both basal ganglia and in the left cerebellum. Old left parieto-occipital cortical and subcortical infarction. Electronically Signed   By: Nelson Chimes M.D.   On: 02/28/2019 16:03    Cardiac Studies   TTE: 03/01/2019  IMPRESSIONS   1. The left ventricle has normal systolic function, with an ejection fraction of 55-60%. The cavity size was normal. Left ventricular diastolic parameters were normal.  2. The right ventricle has normal systolic function. The cavity was normal. There is no increase in right ventricular wall thickness.  3. Mild thickening of the mitral valve leaflet.  4. The aortic valve is tricuspid. Mild thickening of the aortic valve. Mild calcification of the aortic valve. Aortic valve regurgitation is moderate by color flow Doppler.   Patient Profile     53 y.o. male with PMH of HTN, ETOH use and seizures who presented with altered mental status and found to have elevated troponins.   Assessment & Plan    1. Elevated trop: trop 0.91>>1.07 in the setting of altered mental status and severely elevated blood pressures on admission. Flat non ACS appearing trend currently. Echo completed showing normal EF with no WMA noted. Remains on IV heparin. Medical therapy includes BB, ASA, statin. Adding back home amlodipine today.   I agree that the clinical scenario is very consistent with ACS-especially with normal  echocardiogram.  Not unreasonable to do 48 hours of IV heparin, however this simply conservative management.  Need to continue to treat blood pressure.  Currently somewhat labile, most recent recording was over 200 again.  Need to have PRN medications ordered.  We will add PRN hydralazine (too bradycardic for as needed labetalol).  2. EtOH withdrawal/seizure d/o,AMS: mgmt as per primary medicine team. MRI pending  3.  Accelerated HTN with known CAD and no heart failure: BP remains significantly elevated. Unable to further titrate BB 2/2 to bradycardia. Would add back home amlodipine today.   4. Dyslipidemia: last LDL 104; goal LDL<104 given h/o CAD. Pt on high-dose statin; compliance may be an issue.   For questions or updates, please contact [facility] Please  consult [email or web address] for contact info under   Signed, Reino Bellis, NP  03/01/2019, 9:32 AM     ATTENDING ATTESTATION  I have seen, examined and evaluated the patient this morning along with Reino Bellis, NP-C.  After reviewing all the available data and chart, we discussed the patients laboratory, study & physical findings as well as symptoms in detail. I agree with her findings, examination as well as impression recommendations as per our discussion.    Attending adjustments noted in italics.   Overall clinical picture is presentation with acute confusion and altered mental status.  No complaints of chest pain/pressure or dyspnea.  He does have elevated troponin levels which are probably consistent with demand ischemia and elevated troponin from hypertension and potential ongoing neurologic deficit.  Agree with maybe 48 hours of heparin, and no further.  Continue aspirin, statin, beta-blocker and other antihypertensives.  We will write for as needed hydralazine.  At this point, with no clinical suspicion of ACS, my recommendation would be conservative medical management and consider outpatient ischemic assessment  once stabilized.  In the interest of avoiding prolonged hospitalization, I would not recommend inpatient ischemic evaluation unless his neurologic status completely resolves at which time we can consider stress test.   Cardiology will follow along, but not necessary round daily unless clinical changes warrant. Please NOTE -- routine TEE procedures are not being done for CVA evaluation due to COVID-19 restrictions.  Preliminary testing should be TTE with bubble.    Glenetta Hew, M.D., M.S. Interventional Cardiologist   Pager # [phone] Phone # [phone] [address] [place], [place] [address]

## 2019-03-01 NOTE — Evaluation (Signed)
Physical Therapy Evaluation Patient Details Name: Douglas Fowler MRN: [id number] DOB: 28-Dec-1965 Today's Date: 03/01/2019   History of Present Illness  Douglas Fowler is a 53 y.o. male with medical history significant for CAD s/p PCI with stents x 3, multiple ischemic CVA's, HTN, seizures, tobacco abuse, chronic alcohol abuse, who is admitted to [facility] on [facility]/28/19 with acute encephalopathy after from from home. MRI: new multifocal areas of acute nonhemorrhagic infarct involving multiple vascular territories  Clinical Impression  Pt admitted with above diagnosis. Pt currently with functional limitations due to the deficits listed below (see PT Problem List). Pt drowsy with minimal verbalization throughout eval. Pt able to get to EOB independently, needed min A to steady with standing and needed min A for ambulation with max vc's and occasional mod A to direct RW due to pt running into objects and being unable to correct. Pt with cortical blindness per neuro though when asked, pt will confabulate. Pt unsafe to mobilize alone or be home alone.  Pt will benefit from skilled PT to increase their independence and safety with mobility to allow discharge to the venue listed below.       Follow Up Recommendations SNF;Supervision/Assistance - 24 hour    Equipment Recommendations  None recommended by PT    Recommendations for Other Services OT consult;Speech consult     Precautions / Restrictions Precautions Precautions: Fall Precaution Comments: pt with cortical blindness Restrictions Weight Bearing Restrictions: No      Mobility  Bed Mobility Overal bed mobility: Modified Independent             General bed mobility comments: able to get in and OOB without assist  Transfers Overall transfer level: Needs assistance Equipment used: Rolling walker (2 wheeled) Transfers: Sit to/from Stand Sit to Stand: Min assist         General transfer comment: min A to  steady  Ambulation/Gait Ambulation/Gait assistance: Min assist;Mod assist Gait Distance (Feet): 25 Feet Assistive device: Rolling walker (2 wheeled) Gait Pattern/deviations: Step-through pattern Gait velocity: decreased Gait velocity interpretation: <1.8 ft/sec, indicate of risk for recurrent falls General Gait Details: pt running into objects and unable to adequately correct, needed manual facilitation of RW for navigation and was not following R and L cues.   Stairs            Wheelchair Mobility    Modified Rankin (Stroke Patients Only)       Balance Overall balance assessment: Needs assistance Sitting-balance support: No upper extremity supported Sitting balance-Leahy Scale: Good     Standing balance support: Bilateral upper extremity supported Standing balance-Leahy Scale: Poor Standing balance comment: reliant on UE support                             Pertinent Vitals/Pain Pain Assessment: No/denies pain    Home Living Family/patient expects to be discharged to:: Skilled nursing facility Living Arrangements: Spouse/significant other Available Help at Discharge: Family;Available PRN/intermittently Type of Home: House Home Access: Stairs to enter Entrance Stairs-Rails: Right Entrance Stairs-Number of Steps: 2-3 Home Layout: One level Home Equipment: Shower seat Additional Comments: wife works    Prior Function Level of Independence: Secondary school teacher / Transfers Assistance Needed: ambulated with SPC in home and RW out of home  ADL's / Homemaking Assistance Needed: pt reports he did everything for himself but likely that wife was helping him, esp with home mgmt  Comments: poor historian  and confabulates     Hand Dominance   Dominant Hand: Right    Extremity/Trunk Assessment   Upper Extremity Assessment Upper Extremity Assessment: Defer to OT evaluation    Lower Extremity Assessment Lower Extremity Assessment: RLE  deficits/detail;LLE deficits/detail RLE Deficits / Details: WFL RLE Sensation: WNL RLE Coordination: WNL LLE Deficits / Details: grossly 4/5, no noted knee buckling or instability in standing LLE Sensation: decreased proprioception LLE Coordination: decreased fine motor;decreased gross motor    Cervical / Trunk Assessment Cervical / Trunk Assessment: Normal  Communication   Communication: Expressive difficulties(mild slurring)  Cognition Arousal/Alertness: Lethargic;Suspect due to medications(Ativan) Behavior During Therapy: Flat affect Overall Cognitive Status: Impaired/Different from baseline Area of Impairment: Orientation;Attention;Following commands;Memory;Safety/judgement;Awareness;Problem solving                 Orientation Level: Disoriented to;Situation;Time Current Attention Level: Sustained Memory: Decreased short-term memory;Decreased recall of precautions Following Commands: Follows one step commands inconsistently Safety/Judgement: Decreased awareness of safety;Decreased awareness of deficits Awareness: Intellectual Problem Solving: Slow processing;Requires verbal cues;Requires tactile cues;Difficulty sequencing General Comments: neurologist present on eval and demonstrated pt's cortical blindness but even so, pt adamant that he was able to see      General Comments General comments (skin integrity, edema, etc.): pt with very flat affect (had received ATivan for MRI), spoke only when asked questions and did not express concerns with visual deficits    Exercises     Assessment/Plan    PT Assessment Patient needs continued PT services  PT Problem List Decreased strength;Decreased balance;Decreased mobility;Decreased coordination;Decreased cognition;Decreased knowledge of use of DME;Decreased knowledge of precautions;Decreased safety awareness       PT Treatment Interventions DME instruction    PT Goals (Current goals can be found in the Care Plan section)   Acute Rehab PT Goals Patient Stated Goal: none stated PT Goal Formulation: With patient Time For Goal Achievement: 03/15/19 Potential to Achieve Goals: Good    Frequency Min 3X/week   Barriers to discharge Decreased caregiver support wife works    Co-evaluation               AM-PAC PT "6 Clicks" Mobility  Outcome Measure Help needed turning from your back to your side while in a flat bed without using bedrails?: None Help needed moving from lying on your back to sitting on the side of a flat bed without using bedrails?: None Help needed moving to and from a bed to a chair (including a wheelchair)?: A Little Help needed standing up from a chair using your arms (e.g., wheelchair or bedside chair)?: A Little Help needed to walk in hospital room?: A Little Help needed climbing 3-5 steps with a railing? : A Lot 6 Click Score: 19    End of Session Equipment Utilized During Treatment: Gait belt Activity Tolerance: Patient tolerated treatment well Patient left: in bed;with bed alarm set;with call bell/phone within reach Nurse Communication: Mobility status PT Visit Diagnosis: Unsteadiness on feet (R26.81);Difficulty in walking, not elsewhere classified (R26.2);Hemiplegia and hemiparesis Hemiplegia - Right/Left: Left Hemiplegia - dominant/non-dominant: Non-dominant Hemiplegia - caused by: Cerebral infarction    Time: 1422-1445 PT Time Calculation (min) (ACUTE ONLY): 23 min   Charges:   PT Evaluation $PT Eval Moderate Complexity: 1 Mod PT Treatments $Gait Training: 8-22 mins        Leighton Roach, [place]  Pager [phone] [facility] [place] 03/01/2019, 3:19 PM

## 2019-03-02 ENCOUNTER — Inpatient Hospital Stay (HOSPITAL_COMMUNITY): Payer: Medicare HMO

## 2019-03-02 DIAGNOSIS — F1010 Alcohol abuse, uncomplicated: Secondary | ICD-10-CM

## 2019-03-02 DIAGNOSIS — I6300 Cerebral infarction due to thrombosis of unspecified precerebral artery: Secondary | ICD-10-CM

## 2019-03-02 DIAGNOSIS — R404 Transient alteration of awareness: Secondary | ICD-10-CM

## 2019-03-02 DIAGNOSIS — I214 Non-ST elevation (NSTEMI) myocardial infarction: Secondary | ICD-10-CM | POA: Insufficient documentation

## 2019-03-02 DIAGNOSIS — N390 Urinary tract infection, site not specified: Secondary | ICD-10-CM | POA: Diagnosis present

## 2019-03-02 LAB — BASIC METABOLIC PANEL
ANION GAP: 7 (ref 5–15)
BUN: 18 mg/dL (ref 6–20)
CO2: 28 mmol/L (ref 22–32)
Calcium: 8.3 mg/dL — ABNORMAL LOW (ref 8.9–10.3)
Chloride: 102 mmol/L (ref 98–111)
Creatinine, Ser: 1.5 mg/dL — ABNORMAL HIGH (ref 0.61–1.24)
GFR calc Af Amer: >60 mL/min (ref >60)
GFR calc non Af Amer: 52 mL/min — ABNORMAL LOW (ref >60)
Glucose, Bld: 100 mg/dL — ABNORMAL HIGH (ref 70–99)
Potassium: 3.5 mmol/L (ref 3.5–5.1)
Sodium: 137 mmol/L (ref 135–145)

## 2019-03-02 LAB — MAGNESIUM: Magnesium: 2 mg/dL (ref 1.7–2.4)

## 2019-03-02 LAB — LIPID PANEL
Cholesterol: 200 mg/dL (ref 0–200)
HDL: 37 mg/dL — ABNORMAL LOW (ref >40)
LDL Cholesterol: 120 mg/dL — ABNORMAL HIGH (ref 0–99)
TRIGLYCERIDES: 213 mg/dL — AB (ref <150)
Total CHOL/HDL Ratio: 5.4 RATIO
VLDL: 43 mg/dL — ABNORMAL HIGH (ref 0–40)

## 2019-03-02 LAB — HEPARIN LEVEL (UNFRACTIONATED): Heparin Unfractionated: 0.24 IU/mL — ABNORMAL LOW (ref 0.30–0.70)

## 2019-03-02 LAB — HEMOGLOBIN A1C
HEMOGLOBIN A1C: 5.2 % (ref 4.8–5.6)
Mean Plasma Glucose: 102.54 mg/dL

## 2019-03-02 LAB — TROPONIN I: Troponin I: 0.8 ng/mL (ref <0.03)

## 2019-03-02 LAB — PROLACTIN: Prolactin: 12.8 ng/mL (ref 4.0–15.2)

## 2019-03-02 MED ORDER — SODIUM CHLORIDE 0.9 % IV SOLN
1.0000 g | INTRAVENOUS | Status: DC
  Administered 2019-03-02: 1 g via INTRAVENOUS
  Filled 2019-03-02 (×3): qty 10

## 2019-03-02 MED ORDER — ASPIRIN EC 81 MG PO TBEC
81.0000 mg | DELAYED_RELEASE_TABLET | Freq: Every day | ORAL | Status: DC
  Administered 2019-03-03 – 2019-03-05 (×3): 81 mg via ORAL
  Filled 2019-03-02 (×3): qty 1

## 2019-03-02 MED ORDER — SODIUM CHLORIDE 0.9 % IV SOLN
INTRAVENOUS | Status: DC
  Administered 2019-03-02 – 2019-03-04 (×4): via INTRAVENOUS

## 2019-03-02 MED ORDER — HYDRALAZINE HCL 25 MG PO TABS
25.0000 mg | ORAL_TABLET | Freq: Three times a day (TID) | ORAL | Status: DC
  Administered 2019-03-03 – 2019-03-05 (×7): 25 mg via ORAL
  Filled 2019-03-02 (×7): qty 1

## 2019-03-02 NOTE — Progress Notes (Signed)
STROKE TEAM PROGRESS NOTE   INTERVAL HISTORY Patient is known to me from previous hospitalization for stroke in September 2019 when on exam he had significant diminished vision acuity bilaterally and had presented with multiple embolic right-sided infarcts. He was felt to be too high risk for fall and history of alcohol abuse hence not a good long-term anticoagulation candidate and hence TEE and loop recorder not performed. 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 MRI again shows her to be embolic acute and subacute strokes in several different vascular territories.  Vitals:   03/02/19 0052 03/02/19 0500 03/02/19 0500 03/02/19 0730  BP: (!) 142/88  (!) 161/77 (!) 156/76  Pulse: (!) 55  66 64  Resp: 18  18 17   Temp: 97.8 F (36.6 C)  98.6 F (37 C) 98 F (36.7 C)  TempSrc:   Oral Axillary  SpO2: 96%  96% 100%  Weight:  78.1 kg    Height:        CBC:  Recent Labs  Lab 02/28/19 1641 03/01/19 0217  WBC 11.7* 10.5  NEUTROABS 9.7*  --   HGB 14.6 13.3  HCT 45.7 41.8  MCV 94.2 95.4  PLT 182 148*    Basic Metabolic Panel:  Recent Labs  Lab 03/01/19 0217 03/02/19 0029  NA 139 137  K 3.5 3.5  CL 105 102  CO2 22 28  GLUCOSE 93 100*  BUN 14 18  CREATININE 1.46* 1.50*  CALCIUM 8.5* 8.3*  MG 1.5* 2.0  PHOS 3.7  --    Lipid Panel:     Component Value Date/Time   CHOL 200 03/02/2019 0029   TRIG 213 (H) 03/02/2019 0029   HDL 37 (L) 03/02/2019 0029   CHOLHDL 5.4 03/02/2019 0029   VLDL 43 (H) 03/02/2019 0029   LDLCALC 120 (H) 03/02/2019 0029   HgbA1c:  Lab Results  Component Value Date   HGBA1C 5.2 03/02/2019   Urine Drug Screen:     Component Value Date/Time   LABOPIA NONE DETECTED 03/01/2019 1319   COCAINSCRNUR NONE DETECTED 03/01/2019 1319   LABBENZ NONE DETECTED 03/01/2019 1319   AMPHETMU NONE DETECTED 03/01/2019 1319   THCU POSITIVE (A) 03/01/2019 1319   LABBARB NONE DETECTED 03/01/2019 1319    Alcohol Level     Component  Value Date/Time   ETH <10 02/28/2019 1641    IMAGING Dg Chest 2 View  Result Date: 02/28/2019 CLINICAL DATA:  Acute mental status change EXAM: CHEST - 2 VIEW COMPARISON:  December 10, 2018 FINDINGS: Stable cardiomegaly. The hila, mediastinum, lungs, and pleura are otherwise unremarkable. IMPRESSION: No acute abnormalities.  Stable cardiomegaly. Electronically Signed   By: Dorise Bullion III M.D   On: 02/28/2019 16:17   Ct Head Wo Contrast  Result Date: 02/28/2019 CLINICAL DATA:  Altered mental status. 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 EXAM: CT HEAD WITHOUT CONTRAST TECHNIQUE: Contiguous axial images were obtained from the base of the skull through the vertex without intravenous contrast. COMPARISON:  12/15/2018 FINDINGS: Brain: No acute finding by CT. The brainstem shows wall air in degeneration on the right. Few old small vessel cerebellar infarctions. The right cerebral hemisphere shows extensive old ischemic changes throughout the right middle cerebral artery territory. Ischemic changes are present within the basal ganglia. No sign of acute extension. Left hemisphere shows old small vessel infarctions in the basal ganglia and small-vessel changes of the white matter. There is a small region of old ischemic change in the left  parieto-occipital cortical and subcortical brain. Vascular: There is atherosclerotic calcification of the major vessels at the base of the brain. Skull: Negative Sinuses/Orbits: Clear/normal Other: None IMPRESSION: No acute finding by CT. Extensive old ischemic changes in the right hemisphere with atrophy, encephalomalacia and gliosis. Old small vessel infarctions in both basal ganglia and in the left cerebellum. Old left parieto-occipital cortical and subcortical infarction. Electronically Signed   By: Nelson Chimes M.D.   On: 02/28/2019 16:03   Mr Brain Wo Contrast  Result Date: 03/01/2019 CLINICAL DATA:  Coronary artery disease.  Encephalopathy. EXAM: MRI HEAD  WITHOUT CONTRAST TECHNIQUE: Multiplanar, multiecho pulse sequences of the brain and surrounding structures were obtained without intravenous contrast. COMPARISON:  CT head without contrast 02/28/2019 FINDINGS: Brain: Multifocal infarcts are present. There are patchy areas of acute cortical nonhemorrhagic infarction over the right MCA territory. There is involvement of the left occipital lobe in the left PCA territory. There is also scattered involvement of the right MCA territory, to a lesser extent than left. Areas of remote encephalomalacia involving the right MCA territory right occipital lobe are again noted. Remote lacunar infarcts are present in basal ganglia bilaterally. The ventricles are of proportionate to the degree of atrophy. The study is moderately degraded patient motion. Vascular: Flow is present in the major intracranial arteries. Skull and upper cervical spine: The craniocervical junction is normal. Upper cervical spine is within normal limits. Marrow signal is unremarkable. Sinuses/Orbits: The paranasal sinuses and mastoid air cells are clear. The globes and orbits are within normal limits. The IMPRESSION: 1. New multifocal areas of acute nonhemorrhagic infarction involving multiple vascular territories compatible suggesting a central embolic source. 2. Remote encephalomalacia of the right MCA and PCA territories. 3. Remote lacunar infarcts of the basal ganglia. Electronically Signed   By: San Morelle M.D.   On: 03/01/2019 10:53   2D Echocardiogram   1. The left ventricle has normal systolic function, with an ejection fraction of 55-60%. The cavity size was normal. Left ventricular diastolic parameters were normal.  2. The right ventricle has normal systolic function. The cavity was normal. There is no increase in right ventricular wall thickness.  3. Mild thickening of the mitral valve leaflet.  4. The aortic valve is tricuspid. Mild thickening of the aortic valve. Mild  calcification of the aortic valve. Aortic valve regurgitation is moderate by color flow Doppler.   PHYSICAL EXAM Pleasant middle-age Caucasian male who appears not to be in distress. . Afebrile. Head is nontraumatic. Neck is supple without bruit.    Cardiac exam no murmur or gallop. Lungs are clear to auscultation. Distal pulses are well felt. Neurological Exam :  His awake alert oriented to time place and person. No aphasia or apraxia dysarthria. Eyes are slightly disconjugate but are full range of movements yet he appears to have cortical blindness with no light perception in either right. Does not blink to threat in either eye. Pupils both 4 mm sluggishly reactive. Fundi not visualized. Mild left lower facial weakness. Tongue midline. Motor system exam shows no upper or lower eczema to drift but mild weakness of left grip and intrinsic hand muscles. Orbits right over left upper extremity. Mild 4/5 weakness proximally in both lower extremities but left more than right.  ASSESSMENT/PLAN [name] is a 53 y.o. male with history of seizures, tobacco abuse, hypertension, CVA, CAD and chronic alcohol abuse presenting with confusion after not drinking x 3 days.   Stroke:   Multiple bilateral cortical infarcts embolic  in pt with prior embolic infarcts and ETOH abuse with resultant cortical blindness. Old and new infarcts secondary to unknown source. Not an AC candidate d/t ETOH abuse and fall risk.  MRI  New multifocal various territory infarcts. Old BG lacunes. Old R MCA and PCA encephalomalacia  Carotid Doppler  pending  2D Echo EF 55-60%, no SOE mild thickening of the mitral valve leaflet and moderate aortic valve regurgitation  TCD doppler w/ bubbles pending  LDL 120  HgbA1c 5.2  IV heparin for VTE prophylaxis  aspirin 325 mg daily prior to admission, now on aspirin 325 mg daily, clopidogrel 75 mg daily and heparin IV. Given mild stroke, recommend aspirin 81 mg and plavix 75 mg  daily x 3 months, then PLAVIX alone. Orders adjusted.   Therapy recommendations:  SNF  Disposition:  pending   Hypertensive Urgency  BP 225/85 on admission  Remains variable yesterday, 140-160s today . Permissive hypertension (OK if < 220/120) but gradually normalize in 5-7 days . Long-term BP goal normotensive  Hyperlipidemia  Home meds:  lipitor 80, resumed in hospital  LDL 120, goal < 70  Continue statin at discharge  Other Stroke Risk Factors  Cigarette smoker, advised to stop smoking  Chronic ETOH abuse, advised to drink no more than 2 drink(s) a day. On CIWA  UDS positive for THC, advised not to smoke marijuana d/t stroke wrisk  Hx stroke/TIA  08/2018 - Multiple right sided infarcts - embolic - source unknown. Would not pursue further workup (TEE / Loop) for embolic source as pt is not a good candidate for anticoagulation due to ETOH hx and poor medical compliance and fall/safety concerns from poor vision.  Coronary artery disease s/p PCI w/ stents x 3  Other Active Problems  Seizure hx d/t ETOH use. Last drink 3 days ago. On keppra. On CIWA  UTI  Elevated troponin d/t demand. On IV heparin for 24-48 h for conservative treatment  AKI Cr 1.5  Hypomagnesemia 1.5  Hospital day # 1  I have personally obtained history,examined this patient, reviewed notes, independently viewed imaging studies, participated in medical decision making and plan of care.ROS completed by me personally and pertinent positives fully documented  I have made any additions or clarifications directly to the above note. Agree with note above. Patient has presented with multiple embolic by cerebral strokes which are recurrent. His CT angiogram does show diffuse intracranial atherosclerosis which could be contributing. Patient has history of alcohol abuse and is cortically blind and high risk for fall hence we will not pursue TEE and loop recorder. Check transcranial Doppler bubble study for PFO.  Recommend aspirin and Plavix for 3 months followed by Plavix alone. Aggressive risk factor modification. Long discussion with the patient and with Dr. Tamala Julian and answered questions. Greater than 50% time during this 35 minute visit was spent on counseling and coordination of care about his multiple strokes and cortical blindness and answering questions Antony Contras, [facility] Pager: [phone] 03/02/2019 2:17 PM   To contact Stroke Continuity provider, please refer to [email or web address] After hours, contact General Neurology

## 2019-03-02 NOTE — Evaluation (Addendum)
Occupational Therapy Evaluation Patient Details Name: Douglas Fowler MRN: [id number] DOB: [birth year] Today's Date: 03/02/2019    History of Present Illness Douglas Fowler is a 53 y.o. male with medical history significant for CAD s/p PCI with stents x 3, multiple ischemic CVA's, HTN, seizures, tobacco abuse, chronic alcohol abuse, who is admitted to [facility] on 02/28/19 with acute encephalopathy after from from home. MRI: new multifocal areas of acute nonhemorrhagic infarct involving multiple vascular territories   Clinical Impression   This 53 y/o male presents with the above. At baseline pt is mod independent with functional mobility using SPC/RW, reports spouse provides some assist for ADL and tub transfers. Pt presenting with weakness, visual deficits, and impaired cognition all impacting his functional performance. Pt requires min-modA for self-feeding and seated grooming ADL this session. He currently requires modA for UB and LB ADL. Pt requiring increased assist and multimodal cues to locate ADL items and items on lunch tray during this session. Pt noted to have delayed processing and mild confusion when responding to therapist's questions. He will benefit from continued acute OT services and recommend follow up therapy services in SNF setting after discharge to maximize his safety and independence with ADL and mobility. Will follow.     Follow Up Recommendations  SNF;Supervision/Assistance - 24 hour    Equipment Recommendations  Other (comment)(defer to next venue)           Precautions / Restrictions Precautions Precautions: Fall Precaution Comments: pt with cortical blindness Restrictions Weight Bearing Restrictions: No      Mobility Bed Mobility               General bed mobility comments: OOB in recliner upon arrival  Transfers Overall transfer level: Needs assistance Equipment used: Rolling walker (2 wheeled) Transfers: Sit to/from Stand Sit to  Stand: Min assist         General transfer comment: min A to steady; requires assist to place L hand on RW as pt with difficulty doing so     Balance Overall balance assessment: Needs assistance Sitting-balance support: No upper extremity supported Sitting balance-Leahy Scale: Good     Standing balance support: Bilateral upper extremity supported Standing balance-Leahy Scale: Poor Standing balance comment: reliant on UE support                           ADL either performed or assessed with clinical judgement   ADL Overall ADL's : Needs assistance/impaired Eating/Feeding: Minimal assistance;Moderate assistance;Sitting Eating/Feeding Details (indicate cue type and reason): utilized "clock method" to assist pt with orientation to lunch tray, assist to open containers and cut up food; pt with increased difficulty with task due to visual deficits, requires increased time to perform  Grooming: Wash/dry hands;Wash/dry face;Set up;Minimal assistance;Sitting Grooming Details (indicate cue type and reason): setup assist to wash face; minA to wash hands seated in recliner as pt having difficulty processing instruction for task completion Upper Body Bathing: Minimal assistance;Sitting   Lower Body Bathing: Moderate assistance;Sit to/from stand   Upper Body Dressing : Minimal assistance;Sitting   Lower Body Dressing: Moderate assistance;Sit to/from stand Lower Body Dressing Details (indicate cue type and reason): minA standing balance; difficulting reaching LEs for sock management     Toileting- Clothing Manipulation and Hygiene: Moderate assistance;Sit to/from stand       Functional mobility during ADLs: Minimal assistance;Rolling walker General ADL Comments: pt with impaired cognition, visual deficits, decreased activity tolerance and standing  balance      Vision Baseline Vision/History: (per chart pt with cortical blindness; unsure if baseline) Patient Visual Report:  Blurring of vision Vision Assessment?: Vision impaired- to be further tested in functional context Additional Comments: to be further assessed - unclear whether pt was having visual difficulties at baseline or if visual changes are new - pt noted to have increased difficulty locating items on L side vs R (requires assist to place L hand on RW) - will continue to assess     Perception     Praxis      Pertinent Vitals/Pain Pain Assessment: Faces Faces Pain Scale: Hurts little more Pain Location: headache Pain Descriptors / Indicators: Headache     Hand Dominance Right   Extremity/Trunk Assessment Upper Extremity Assessment Upper Extremity Assessment: Generalized weakness   Lower Extremity Assessment Lower Extremity Assessment: Defer to PT evaluation   Cervical / Trunk Assessment Cervical / Trunk Assessment: Normal   Communication Communication Communication: Expressive difficulties(mild slurring)   Cognition Arousal/Alertness: Awake/alert(Ativan) Behavior During Therapy: Flat affect Overall Cognitive Status: Impaired/Different from baseline Area of Impairment: Orientation;Attention;Following commands;Memory;Safety/judgement;Awareness;Problem solving                 Orientation Level: Disoriented to(Pt becoming slightly agitated with orientation questions) Current Attention Level: Sustained Memory: Decreased short-term memory;Decreased recall of precautions Following Commands: Follows one step commands with increased time Safety/Judgement: Decreased awareness of safety;Decreased awareness of deficits Awareness: Intellectual Problem Solving: Slow processing;Requires verbal cues;Requires tactile cues;Difficulty sequencing;Decreased initiation General Comments: pt with slower processing noted, mild confusion with answering therapist's questions requiring repetition and simplification of statements   General Comments       Exercises     Shoulder Instructions       Home Living Family/patient expects to be discharged to:: Skilled nursing facility Living Arrangements: Spouse/significant other Available Help at Discharge: Family;Available PRN/intermittently Type of Home: House Home Access: Stairs to enter [employer] of Steps: 2-3 Entrance Stairs-Rails: Right Home Layout: One level     Bathroom Shower/Tub: Teacher, early years/pre: Standard Bathroom Accessibility: No   Home Equipment: Building services engineer Comments: wife works      Prior Functioning/Environment Level of Independence: Engineer, materials / Transfers Assistance Needed: ambulated with SPC in home and RW out of home ADL's / Homemaking Assistance Needed: per PT eval pt reports he was performing ADL without assist; reports to OT spouse was assisting with LB ADL, shower transfer, and was assisting with iADL            OT Problem List: Decreased strength;Decreased range of motion;Decreased activity tolerance;Impaired balance (sitting and/or standing);Impaired vision/perception;Decreased cognition;Decreased safety awareness;Decreased knowledge of use of DME or AE      OT Treatment/Interventions: Self-care/ADL training;Therapeutic exercise;Neuromuscular education;Energy conservation;DME and/or AE instruction;Therapeutic activities;Cognitive remediation/compensation;Visual/perceptual remediation/compensation;Patient/family education;Balance training    OT Goals(Current goals can be found in the care plan section) Acute Rehab OT Goals Patient Stated Goal: regain independence OT Goal Formulation: With patient Time For Goal Achievement: 03/16/19 Potential to Achieve Goals: Good  OT Frequency: Min 2X/week   Barriers to D/C:            Co-evaluation              AM-PAC OT "6 Clicks" Daily Activity     Outcome Measure Help from another person eating meals?: A Lot Help from another person taking care of personal grooming?: A Lot Help from  another person toileting, which includes using toliet, bedpan,  or urinal?: A Lot Help from another person bathing (including washing, rinsing, drying)?: A Lot Help from another person to put on and taking off regular upper body clothing?: A Lot Help from another person to put on and taking off regular lower body clothing?: A Lot 6 Click Score: 12   End of Session Equipment Utilized During Treatment: Surveyor, mining Communication: Mobility status  Activity Tolerance: Patient tolerated treatment well Patient left: in chair;with call bell/phone within reach;with chair alarm set  OT Visit Diagnosis: Unsteadiness on feet (R26.81);Other symptoms and signs involving the nervous system (R29.898);Other symptoms and signs involving cognitive function                Time: 3868-5488 OT Time Calculation (min): 21 min Charges:  OT General Charges $OT Visit: 1 Visit OT Evaluation $OT Eval Moderate Complexity: 1 Mod  Lou Cal, OT [employer] Pager [phone] Office [phone]   Raymondo Band 03/02/2019, 2:53 PM

## 2019-03-02 NOTE — NC FL2 (Signed)
[place] LEVEL OF CARE SCREENING TOOL     IDENTIFICATION  Patient Name: Douglas Fowler Birthdate: 1966/09/02 Sex: male Admission Date (Current Location): 02/28/2019  [facility] and [place] Number:  [employer] and Address:  The [facility] [place] [address] [place], [place] [address]      Provider Number: [id number]  Attending Physician Name and Address:  Norval Morton, MD  Relative Name and Phone Number:  Loma Sousa (daughter) [phone]    Current Level of Care: Hospital Recommended Level of Care: [place] Prior Approval Number: [id number]  Date Approved/Denied: 12/17/18 PASRR Number:    Discharge Plan: SNF    Current Diagnoses: Patient Active Problem List   Diagnosis Date Noted  . Elevated troponin 03/01/2019  . Chronic alcohol abuse 03/01/2019  . Acute encephalopathy 02/28/2019  . Alcohol withdrawal delirium ([place]) 12/11/2018  . Alcohol withdrawal ([place]) 12/11/2018  . Alcohol withdrawal seizure with complication, with unspecified complication ([place]) 68/34/1962  . Essential hypertension 08/05/2018  . CAD (coronary artery disease) 08/05/2018  . Tobacco abuse 08/05/2018  . Hypokalemia   . AKI (acute kidney injury) ([place])   . Acute blood loss anemia   . CVA (cerebral vascular accident) ([place]) 08/04/2018    Orientation RESPIRATION BLADDER Height & Weight     Self, Place, Situation(memory impairment, poor concentration, poor judgement and safety awareness)  Normal Incontinent Weight: 172 lb 2.9 oz (78.1 kg) Height:  5\' 7"  (170.2 cm)  BEHAVIORAL SYMPTOMS/MOOD NEUROLOGICAL BOWEL NUTRITION STATUS    Convulsions/Seizures(seizures) Continent Diet(see discharge summary)  AMBULATORY STATUS COMMUNICATION OF NEEDS Skin   Limited Assist Verbally Normal                       Personal Care Assistance Level of Assistance  Bathing, Feeding, Dressing, Total care Bathing Assistance: Limited assistance Feeding  assistance: Limited assistance Dressing Assistance: Limited assistance Total Care Assistance: Limited assistance   Functional Limitations Info  Sight, Hearing, Speech Sight Info: Impaired(cortical blindness) Hearing Info: Adequate Speech Info: Adequate    SPECIAL CARE FACTORS FREQUENCY  PT (By licensed PT), OT (By licensed OT)     PT Frequency: min 5x weekly OT Frequency: min 5x weekly            Contractures Contractures Info: Not present    Additional Factors Info  Code Status, Allergies Code Status Info: full Allergies Info: no known allergies           Current Medications (03/02/2019):  This is the current hospital active medication list Current Facility-Administered Medications  Medication Dose Route Frequency Provider Last Rate Last Dose  . acetaminophen (TYLENOL) tablet 650 mg  650 mg Oral Q6H PRN Howerter, Justin B, DO      . amLODipine (NORVASC) tablet 10 mg  10 mg Oral Daily Reino Bellis B, NP   10 mg at 03/02/19 1006  . aspirin tablet 325 mg  325 mg Oral Daily Howerter, Justin B, DO   325 mg at 03/02/19 1007  . atorvastatin (LIPITOR) tablet 80 mg  80 mg Oral q1800 Howerter, Justin B, DO   80 mg at 03/01/19 1735  . carvedilol (COREG) tablet 6.25 mg  6.25 mg Oral BID WC Howerter, Justin B, DO   6.25 mg at 03/02/19 0920  . clopidogrel (PLAVIX) tablet 75 mg  75 mg Oral Daily Kerney Elbe, MD   75 mg at 03/02/19 1007  . folic acid (FOLVITE) tablet 1 mg  1 mg Oral  Daily Howerter, Justin B, DO   1 mg at 03/02/19 1007  . heparin ADULT infusion 100 units/mL (25000 units/273mL sodium chloride 0.45%)  1,150 Units/hr Intravenous Continuous Leonie Man, MD 11.5 mL/hr at 03/02/19 0142 1,150 Units/hr at 03/02/19 0142  . hydrALAZINE (APRESOLINE) injection 10 mg  10 mg Intravenous Q4H PRN Fuller Plan A, MD      . levETIRAcetam (KEPPRA) tablet 500 mg  500 mg Oral BID Howerter, Justin B, DO   500 mg at 03/02/19 1006  . LORazepam (ATIVAN) tablet 1 mg  1 mg Oral Q6H PRN  Howerter, Justin B, DO   1 mg at 03/01/19 1823   Or  . LORazepam (ATIVAN) injection 1 mg  1 mg Intravenous Q6H PRN Howerter, Justin B, DO      . LORazepam (ATIVAN) injection 1 mg  1 mg Intravenous Q1H PRN Howerter, Justin B, DO      . magnesium oxide (MAG-OX) tablet 400 mg  400 mg Oral Daily Smith, Rondell A, MD   400 mg at 03/02/19 1007  . multivitamin (PROSIGHT) tablet 1 tablet  1 tablet Oral Daily Howerter, Justin B, DO   1 tablet at 03/02/19 1006  . nicotine (NICODERM CQ - dosed in mg/24 hours) patch 21 mg  21 mg Transdermal Daily PRN Howerter, Justin B, DO      . ondansetron (ZOFRAN) tablet 4 mg  4 mg Oral Q6H PRN Howerter, Justin B, DO   4 mg at 03/01/19 1120   Or  . ondansetron (ZOFRAN) injection 4 mg  4 mg Intravenous Q6H PRN Howerter, Justin B, DO      . thiamine (VITAMIN B-1) tablet 100 mg  100 mg Oral Daily Howerter, Justin B, DO   100 mg at 03/02/19 1006     Discharge Medications: Please see discharge summary for a list of discharge medications.  Relevant Imaging Results:  Relevant Lab Results:   Additional Information SSN: [id number]  Alberteen Sam, LCSW

## 2019-03-02 NOTE — Progress Notes (Signed)
CSW attempted to contact family regarding discharge planning as patient not fully oriented. CSW lvm with patient's daughter Douglas Fowler. CSW called patient's spouse Ladell Heads however vm is full, unable to leave vm.   CSW will continue to follow up with family to consult regarding SNF recommendation at time of discharge.   [place], [place]

## 2019-03-02 NOTE — Progress Notes (Signed)
Progress Note  Patient Name: Douglas Fowler Date of Encounter: 03/02/2019  Primary Cardiologist: No primary care provider on file.  Previously saw Dr. Agustin Cree (2017)  Subjective   Pleasantly confused.   He now knows that he is able miles, hospital a 7.  He knows that he lives [place]. No chest pain.  Inpatient Medications    Scheduled Meds:  amLODipine  10 mg Oral Daily   [START ON 03/03/2019] aspirin EC  81 mg Oral Daily   atorvastatin  80 mg Oral q1800   carvedilol  6.25 mg Oral BID WC   clopidogrel  75 mg Oral Daily   folic acid  1 mg Oral Daily   [START ON 03/03/2019] hydrALAZINE  25 mg Oral Q8H   levETIRAcetam  500 mg Oral BID   magnesium oxide  400 mg Oral Daily   multivitamin  1 tablet Oral Daily   thiamine  100 mg Oral Daily   Continuous Infusions:  sodium chloride 75 mL/hr at 03/02/19 1201   cefTRIAXone (ROCEPHIN)  IV 1 g (03/02/19 1240)   PRN Meds: acetaminophen, hydrALAZINE, LORazepam **OR** LORazepam, LORazepam, nicotine, ondansetron **OR** ondansetron (ZOFRAN) IV   Vital Signs    Vitals:   03/02/19 0052 03/02/19 0500 03/02/19 0500 03/02/19 0730  BP: (!) 142/88  (!) 161/77 (!) 156/76  Pulse: (!) 55  66 64  Resp: 18  18 17   Temp: 97.8 F (36.6 C)  98.6 F (37 C) 98 F (36.7 C)  TempSrc:   Oral Axillary  SpO2: 96%  96% 100%  Weight:  78.1 kg    Height:        Intake/Output Summary (Last 24 hours) at 03/02/2019 1634 Last data filed at 03/01/2019 1700 Gross per 24 hour  Intake 240 ml  Output 350 ml  Net -110 ml   Last 3 Weights 03/02/2019 03/01/2019 02/28/2019  Weight (lbs) 172 lb 2.9 oz 167 lb 15.9 oz 186 lb 15.2 oz  Weight (kg) 78.1 kg 76.2 kg 84.8 kg      Telemetry    Sinus bradycardia rates in the 50 to 60 bpm range- Personally Reviewed  ECG    No new EKG today.- Personally Reviewed  Physical Exam   GEN:  Remains resting quietly in his chair.  No acute distress.  Still confused (see above).   HEENT: Poor  dentition Neck: No JVD or bruits  Cardiac:  RRR normal S1-S2.  Soft 1/6 SEM at RUSB.  No obvious DM.  No rubs or gallops. Respiratory:  CTA B, nonlabored.  Good air movement. GI:  Soft/NT NABS.  No HSM. MS:  No C/C/E Neuro:   Alert and oriented person place Psych: Remains confused  Labs    Chemistry Recent Labs  Lab 02/28/19 1641 03/01/19 0217 03/02/19 0029  NA 139 139 137  K 3.8 3.5 3.5  CL 103 105 102  CO2 24 22 28   GLUCOSE 103* 93 100*  BUN 15 14 18   CREATININE 1.62* 1.46* 1.50*  CALCIUM 9.5 8.5* 8.3*  PROT 6.7  --   --   ALBUMIN 3.5  --   --   AST 24  --   --   ALT 11  --   --   ALKPHOS 98  --   --   BILITOT 0.9  --   --   GFRNONAA 47* 54* 52*  GFRAA 55* >60 >60  ANIONGAP 12 12 7      Hematology Recent Labs  Lab 02/28/19 1641 03/01/19 0217  WBC 11.7* 10.5  RBC 4.85 4.38  HGB 14.6 13.3  HCT 45.7 41.8  MCV 94.2 95.4  MCH 30.1 30.4  MCHC 31.9 31.8  RDW 14.2 14.1  PLT 182 148*    Cardiac Enzymes Recent Labs  Lab 03/01/19 0217 03/01/19 1310 03/01/19 1841 03/02/19 0029  TROPONINI 1.07* 1.04* 0.79* 0.80*   No results for input(s): TROPIPOC in the last 168 hours.   BNPNo results for input(s): BNP, PROBNP in the last 168 hours.   DDimer No results for input(s): DDIMER in the last 168 hours.   Radiology    Mr Brain Wo Contrast  Result Date: 03/01/2019 CLINICAL DATA:  Coronary artery disease.  Encephalopathy. EXAM: MRI HEAD WITHOUT CONTRAST TECHNIQUE: Multiplanar, multiecho pulse sequences of the brain and surrounding structures were obtained without intravenous contrast. COMPARISON:  CT head without contrast 02/28/2019 FINDINGS: Brain: Multifocal infarcts are present. There are patchy areas of acute cortical nonhemorrhagic infarction over the right MCA territory. There is involvement of the left occipital lobe in the left PCA territory. There is also scattered involvement of the right MCA territory, to a lesser extent than left. Areas of remote  encephalomalacia involving the right MCA territory right occipital lobe are again noted. Remote lacunar infarcts are present in basal ganglia bilaterally. The ventricles are of proportionate to the degree of atrophy. The study is moderately degraded patient motion. Vascular: Flow is present in the major intracranial arteries. Skull and upper cervical spine: The craniocervical junction is normal. Upper cervical spine is within normal limits. Marrow signal is unremarkable. Sinuses/Orbits: The paranasal sinuses and mastoid air cells are clear. The globes and orbits are within normal limits. The IMPRESSION: 1. New multifocal areas of acute nonhemorrhagic infarction involving multiple vascular territories compatible suggesting a central embolic source. 2. Remote encephalomalacia of the right MCA and PCA territories. 3. Remote lacunar infarcts of the basal ganglia. Electronically Signed   By: San Morelle M.D.   On: 03/01/2019 10:53   Vas US Carotid (at [facility] Only)  Result Date: 03/02/2019 Carotid Arterial Duplex Study Indications: CVA. Performing Technologist: Maudry Mayhew MHA, RDMS, RVT, RDCS  Examination Guidelines: A complete evaluation includes B-mode imaging, spectral Doppler, color Doppler, and power Doppler as needed of all accessible portions of each vessel. Bilateral testing is considered an integral part of a complete examination. Limited examinations for reoccurring indications may be performed as noted.  Right Carotid Findings: +----------+--------+--------+--------+-----------------------+--------+             PSV cm/s EDV cm/s Stenosis Describe                Comments  +----------+--------+--------+--------+-----------------------+--------+  CCA Prox   61       12                                                  +----------+--------+--------+--------+-----------------------+--------+  CCA Distal 40       9                 heterogenous and smooth            +----------+--------+--------+--------+-----------------------+--------+  ICA Prox   39       8                 smooth and heterogenous           +----------+--------+--------+--------+-----------------------+--------+  ICA Distal 27       9                                                   +----------+--------+--------+--------+-----------------------+--------+  ECA        87       14                                                  +----------+--------+--------+--------+-----------------------+--------+ +----------+--------+-------+----------------+-------------------+             PSV cm/s EDV cms Describe         Arm Pressure (mmHG)  +----------+--------+-------+----------------+-------------------+  Subclavian 118              Multiphasic, WNL                      +----------+--------+-------+----------------+-------------------+ +---------+--------+--------+--------------+  Vertebral PSV cm/s EDV cm/s Not identified  +---------+--------+--------+--------------+  Left Carotid Findings: +----------+-------+-------+--------+---------------------------------+--------+             PSV     EDV     Stenosis Describe                          Comments              cm/s    cm/s                                                         +----------+-------+-------+--------+---------------------------------+--------+  CCA Prox   78      16               smooth and heterogenous                     +----------+-------+-------+--------+---------------------------------+--------+  CCA Distal 60      13               smooth and heterogenous                     +----------+-------+-------+--------+---------------------------------+--------+  ICA Prox   59      21               irregular, heterogenous and                                                      calcific                                    +----------+-------+-------+--------+---------------------------------+--------+  ICA Distal 73      23                                                            +----------+-------+-------+--------+---------------------------------+--------+  ECA        78      12                                                           +----------+-------+-------+--------+---------------------------------+--------+ +----------+--------+--------+----------------+-------------------+  Subclavian PSV cm/s EDV cm/s Describe         Arm Pressure (mmHG)  +----------+--------+--------+----------------+-------------------+             80                Multiphasic, WNL                      +----------+--------+--------+----------------+-------------------+ +---------+--------+--+--------+-+---------+  Vertebral PSV cm/s 28 EDV cm/s 8 Antegrade  +---------+--------+--+--------+-+---------+  Summary: Right Carotid: Velocities in the right ICA are consistent with a 1-39% stenosis. Left Carotid: Velocities in the left ICA are consistent with a 1-39% stenosis. Vertebrals:  Left vertebral artery demonstrates antegrade flow. Right vertebral              artery was not visualized. Subclavians: Normal flow hemodynamics were seen in bilateral subclavian              arteries. *See table(s) above for measurements and observations.     Preliminary    Vas [place] Transcranial Doppler  Result Date: 03/02/2019  Transcranial Doppler Indications: Stroke. Performing Technologist: Maudry Mayhew RDMS, RVT, RDCS  Examination Guidelines: A complete evaluation includes B-mode imaging, spectral Doppler, color Doppler, and power Doppler as needed of all accessible portions of each vessel. Bilateral testing is considered an integral part of a complete examination. Limited examinations for reoccurring indications may be performed as noted.  +----------+-------------+----------+-----------+------------------+  RIGHT TCD  Right VM (cm) Depth (cm) Pulsatility      Comment        +----------+-------------+----------+-----------+------------------+  MCA            15.00                   1.37                          +----------+-------------+----------+-----------+------------------+  ACA           -42.00                   1.01                         +----------+-------------+----------+-----------+------------------+  Term ICA                                        Unable to insonate  +----------+-------------+----------+-----------+------------------+  PCA                                             Unable to insonate  +----------+-------------+----------+-----------+------------------+  Opthalmic      27.00                   1.50                         +----------+-------------+----------+-----------+------------------+  ICA siphon     27.00                   1.47                         +----------+-------------+----------+-----------+------------------+  Vertebral                                       Unable to insonate  +----------+-------------+----------+-----------+------------------+  +----------+------------+----------+-----------+------------------+  LEFT TCD   Left VM (cm) Depth (cm) Pulsatility      Comment        +----------+------------+----------+-----------+------------------+  MCA                                            Unable to insonate  +----------+------------+----------+-----------+------------------+  ACA           -33.00                  1.33                         +----------+------------+----------+-----------+------------------+  Term ICA                                       Unable to insonate  +----------+------------+----------+-----------+------------------+  PCA           16.00                   1.28                         +----------+------------+----------+-----------+------------------+  Opthalmic     32.00                   1.50                         +----------+------------+----------+-----------+------------------+  ICA siphon    19.00                   1.50                         +----------+------------+----------+-----------+------------------+  Vertebral                                       Unable to insonate  +----------+------------+----------+-----------+------------------+  +------------+-------+------------------+               VM cm/s      Comment        +------------+-------+------------------+  Prox Basilar         Unable to insonate  +------------+-------+------------------+    Preliminary     Cardiac Studies   TTE: 03/01/2019  IMPRESSIONS   1. The left ventricle has normal systolic function, with an ejection fraction of 55-60%. The cavity size was normal. Left ventricular diastolic parameters were normal.  2. The right ventricle has normal systolic function. The cavity was normal. There is no increase in right ventricular wall thickness.  3. Mild thickening of the mitral valve  leaflet.  4. The aortic valve is tricuspid. Mild thickening of the aortic valve. Mild calcification of the aortic valve. Aortic valve regurgitation is moderate by color flow Doppler.   Patient Profile     52 y.o. male with PMH of HTN, ETOH use and seizures who presented with altered mental status and found to have elevated troponins.   Assessment & Plan    1. Elevated trop: trop 0.91>>1.07 in the setting of altered mental status and severely elevated blood pressures on admission. Flat non ACS appearing trend currently. Echo completed showing normal EF with no WMA noted. Remains on IV heparin. Medical therapy includes BB, ASA, statin. Adding back home amlodipine today.  --I suspect that this is demand ischemia from hypertensive urgency and neurologic issues   I agree that the clinical scenario is very consistent with ACS-especially with normal echocardiogram.    No longer on IV heparin  Need to continue to treat blood pressure.  Currently somewhat labile, most recent recording was over 200 again.  Need to have PRN medications ordered.    Added as needed hydralazine, has not yet required.   2. EtOH withdrawal/seizure d/o,AMS: mgmt as per primary medicine  team. MRI pending  Multiple sites of prior strokes noted.  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  Please see neurology recommendations.  Based on significant fall risk and noncompliance history, would not be a candidate for anticoagulation.    As such, would not plan for routine TEE/loop recorder in this patient in whom it would otherwise be warranted. -- these procedures are not being done routinely with COVID-19 restrictions.  3.  Accelerated HTN with known CAD and no heart failure: BP remains significantly elevated. Unable to further titrate BB 2/2 to bradycardia.   Blood pressure little better back on amlodipine..   4. Dyslipidemia: last LDL 104; goal LDL<104 given h/o CAD.   On high-dose statin.  Compliance may be initiated.  Also alcohol abuse, this may be an issue as well.   For questions or updates, please contact [place] Please consult [email or web address] for contact info under   Signed, Glenetta Hew, MD  03/02/2019, 4:34 PM    [facility] will sign off.   Medication Recommendations:  Continue to monitor BP - & increase Hydralazine Other recommendations (labs, testing, etc):  None  Follow up as an outpatient:  Will arrange f/u with Dr. Roma Schanz, M.D., M.S. Interventional Cardiologist   Pager # [phone] Phone # [phone] [address] [place], [place] [address]

## 2019-03-02 NOTE — Progress Notes (Addendum)
Progress Note    Douglas Fowler  ZOX:[id number] DOB: 12/19/1965  DOA: 02/28/2019 PCP: Imagene Riches, NP    Brief Narrative:   Chief complaint: Confusion  Medical records reviewed and are as summarized below:  Douglas Fowler is an 53 y.o. male with pmh of  CAD s/p PCI with stents x 3, multiple ischemic CVA's, HTN, seizures, tobacco abuse, chronic alcohol abuse, who is admitted to [facility] on 02/28/19 with acute encephalopathy after from from home by way of EMS to [facility] ED for evaluation of confusion.    Assessment/Plan:   Principal Problem:   Acute encephalopathy Active Problems:   CVA (cerebral vascular accident) ([place])   Essential hypertension   AKI (acute kidney injury) ([place])   Elevated troponin   Chronic alcohol abuse   Acute lower UTI   1.  Acute encephalopathy: Patient presented with confusion only oriented to person.  Alcohol level undetectable. TSH within normal limits.  Urine drug screen positive for marijuana.  Suspect symptoms could be related with an acute stroke seen on MRI given concern for cortical blindness.  He was also found to have signs of urinary tract infection.  Suspect symptoms could likely be multifactorial in nature. -Continue neurochecks -Will need to counsel on the need to refrain from any illicit drug use  2.  Embolic CVA with cortical blindness, history of CVA with residual weakness: Acute.  Patient with previous history of CVA with left-sided weakness.  CT scan did not show any acute abnormalities, but MRI of the brain revealed multifocal areas of acute nonhemorrhagic infarction evolving multiple vascular territories compatible with a central embolic source.  Echocardiogram revealed EF of 55 to 60% with normal diastolic dysfunction.  Hemoglobin A1c noted to be 5.2 and LDL 120.  Goal LDL less than 70.   Suspecting possible cardioembolic source.  Neurology was formally consulted and recommended patient to be on aspirin and Plavix with  possibility of switching to anticoagulation after 3 to 7 days.  Report patient likely has cortical blindness.  PT/OT recommending skilled nursing facility/24-hour supervision. -Stroke order-set initiated   -Fall risk -Neurochecks -ASA, Plavix, and statin  -PT/OT to evaluate and treat   -Follow-up transcranial vascular Doppler ultrasound  -Check blood culture x2 for the possibility of endocarditis -Dr. Cheral Marker neurology consulted, will follow-up for further recommendation -Discussed need of TEE with cardiology, but may be deferred given Covid-19  -Neurology to check a TEE with bubble study instead -Patient to be advised that discharge not to drive -Social work helping with possible placement  3.  Elevated troponin 2/2 demand:Acute. Troponin  0.91-> 1.07->0.8->0.79.  Patient without complaints of chest pain.  Echocardiogram revealing EF of 55 to 60% with note of mild thickening of the mitral valve leaflet and moderate aortic valve regurgitation. -Will discontinue heparin drip -Will consider ACE-I/ARB kidney function improved -Appreciate cardiology consultative services, will follow for further recommendation  4.  Alcohol abuse, history of seizure: Last drink approximately 3 days prior to arrival.  Patient with previous history of alcohol abuse and suspected withdrawal seizures.  Per review of records neurology had recommended long-term AED during his last admission in 12/2018.  Alcohol level was noted to be undetectable. -CIWA protocols -Continue Keppra  5.  Hypertensive urgency: Improving.  Blood pressures noted to be elevated up to 225/85 on admission.  Blood pressures appear to be improving currently 156/75. -Continue Coreg and amlodipine -Will start hydralazine 25 mg p.o. 3 times daily tomorrow -Hydralazine IV as  needed  6.  Acute kidney injury: Acute patient presents with elevated creatinine up to 1.62 with BUN within normal limits on admission.  Creatinine trended downward after  patient given IV fluids.  Baseline creatinine previously around 1.2.  Appears to be prerenal given FeNa 0.75.  Patient had been outside in the heat for last 2 days prior to admission.  Urinary tract infection and uncontrolled blood pressure may also be playing a factor. -Continue IV fluids normal saline at 75 mL/h -Recheck BMP in a.m.  7.  Urinary tract infection: Acute.  Patient was found to have abnormal urinalysis with greater than 100,000 gram-negative rods present in urine.  -Will follow-up culture -Rocephin IV x1 dose -Likely switch to Keflex p.o. in a.m.  8.  Dyslipidemia: Last LDL 104 checkedin 08/2018.  Patient currently taking high-dose statin at home, but repeat check worsening LDL elevated at 120.  Goal LDL < 70.  Question patient compliance. -Continue atorvastatin 80 mg daily -Will need to follow better dietary restrictions   9.  Hypomagnesemia: Acute on chronic.  Magnesium noted to be 1.5.  Repeat check this a.m. within normal limits. -Magnesium 400 mg daily -Continue to monitor and replace as needed   Body mass index is 26.97 kg/m.   Family Communication/Anticipated D/C date and plan/Code Status   DVT prophylaxis: heparin subq Code Status: Full Code.  Family Communication: No family present at bedside Disposition Plan: To be determined   Medical Consultants:    Cardiology- Dr. Ellyn Hack  Neurology- Dr. Cheral Marker   Anti-Infectives:    Rocephin day 1  Subjective:   Patient reports that he has had no issues overnight and denies any complaints at this time.  Objective:    Vitals:   03/02/19 0052 03/02/19 0500 03/02/19 0500 03/02/19 0730  BP: (!) 142/88  (!) 161/77 (!) 156/76  Pulse: (!) 55  66 64  Resp: 18  18 17   Temp: 97.8 F (36.6 C)  98.6 F (37 C) 98 F (36.7 C)  TempSrc:   Oral Axillary  SpO2: 96%  96% 100%  Weight:  78.1 kg    Height:        Intake/Output Summary (Last 24 hours) at 03/02/2019 1034 Last data filed at 03/01/2019 1700 Gross  per 24 hour  Intake 240 ml  Output 350 ml  Net -110 ml   Filed Weights   02/28/19 1509 03/01/19 0500 03/02/19 0500  Weight: 84.8 kg 76.2 kg 78.1 kg    Exam: Constitutional: Middle-age male in NAD, calm, comfortable Eyes:  lids and conjunctivae normal.  Cortically blind. ENMT: Mucous membranes are moist. Posterior pharynx clear of any exudate or lesions.  Neck: normal, supple, no masses, no thyromegaly Respiratory: clear to auscultation bilaterally, no wheezing, no crackles. Normal respiratory effort. No accessory muscle use.  Cardiovascular: Regular rate and rhythm, no murmurs / rubs / gallops. No extremity edema. 2+ pedal pulses. No carotid bruits.  Abdomen: no tenderness, no masses palpated. No hepatosplenomegaly. Bowel sounds positive.  Musculoskeletal: no clubbing / cyanosis. No joint deformity upper and lower extremities. Good ROM, no contractures. Normal muscle tone.  Skin: no rashes, lesions, ulcers. No induration Neurologic: CN 2-12 grossly intact. Sensation intact, DTR normal. Strength 5/5 in right upper and lower extremities.  4/5 in left upper and lower extremities. Psychiatric: Confused. Alert and oriented x person. Normal mood.    Data Reviewed:   I have personally reviewed following labs and imaging studies:  Labs: Labs show the following:   Basic Metabolic Panel:  Recent Labs  Lab 02/28/19 1641 03/01/19 0217 03/02/19 0029  NA 139 139 137  K 3.8 3.5 3.5  CL 103 105 102  CO2 24 22 28   GLUCOSE 103* 93 100*  BUN 15 14 18   CREATININE 1.62* 1.46* 1.50*  CALCIUM 9.5 8.5* 8.3*  MG  --  1.5* 2.0  PHOS  --  3.7  --    GFR Estimated Creatinine Clearance: 52.6 mL/min (A) (by C-G formula based on SCr of 1.5 mg/dL (H)). Liver Function Tests: Recent Labs  Lab 02/28/19 1641  AST 24  ALT 11  ALKPHOS 98  BILITOT 0.9  PROT 6.7  ALBUMIN 3.5   No results for input(s): LIPASE, AMYLASE in the last 168 hours. Recent Labs  Lab 02/28/19 1641  AMMONIA 27    Coagulation profile Recent Labs  Lab 03/01/19 0217  INR 1.3*    CBC: Recent Labs  Lab 02/28/19 1641 03/01/19 0217  WBC 11.7* 10.5  NEUTROABS 9.7*  --   HGB 14.6 13.3  HCT 45.7 41.8  MCV 94.2 95.4  PLT 182 148*   Cardiac Enzymes: Recent Labs  Lab 02/28/19 1641 03/01/19 0217 03/01/19 1310 03/01/19 1841 03/02/19 0029  TROPONINI 0.91* 1.07* 1.04* 0.79* 0.80*   BNP (last 3 results) No results for input(s): PROBNP in the last 8760 hours. CBG: Recent Labs  Lab 02/28/19 1642  GLUCAP 93   D-Dimer: No results for input(s): DDIMER in the last 72 hours. Hgb A1c: Recent Labs    03/02/19 0029  HGBA1C 5.2   Lipid Profile: Recent Labs    03/02/19 0029  CHOL 200  HDL 37*  LDLCALC 120*  TRIG 213*  CHOLHDL 5.4   Thyroid function studies: Recent Labs    03/01/19 0217  TSH 1.793   Anemia work up: No results for input(s): VITAMINB12, FOLATE, FERRITIN, TIBC, IRON, RETICCTPCT in the last 72 hours. Sepsis Labs: Recent Labs  Lab 02/28/19 1641 03/01/19 0217  WBC 11.7* 10.5    Microbiology Recent Results (from the past 240 hour(s))  Urine culture     Status: Abnormal (Preliminary result)   Collection Time: 03/01/19  1:19 PM  Result Value Ref Range Status   Specimen Description URINE, CLEAN CATCH  Final   Special Requests NONE  Final   Culture (A)  Final    >=100,000 COLONIES/mL GRAM NEGATIVE RODS IDENTIFICATION AND SUSCEPTIBILITIES TO FOLLOW Performed at [facility], [address], [place], [place] [address]    Report Status PENDING  Incomplete    Procedures and diagnostic studies:  Dg Chest 2 View  Result Date: 02/28/2019 CLINICAL DATA:  Acute mental status change EXAM: CHEST - 2 VIEW COMPARISON:  December 10, 2018 FINDINGS: Stable cardiomegaly. The hila, mediastinum, lungs, and pleura are otherwise unremarkable. IMPRESSION: No acute abnormalities.  Stable cardiomegaly. Electronically Signed   By: Dorise Bullion III M.D   On: 02/28/2019 16:17    Ct Head Wo Contrast  Result Date: 02/28/2019 CLINICAL DATA:  Altered mental status. 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 EXAM: CT HEAD WITHOUT CONTRAST TECHNIQUE: Contiguous axial images were obtained from the base of the skull through the vertex without intravenous contrast. COMPARISON:  12/15/2018 FINDINGS: Brain: No acute finding by CT. The brainstem shows wall air in degeneration on the right. Few old small vessel cerebellar infarctions. The right cerebral hemisphere shows extensive old ischemic changes throughout the right middle cerebral artery territory. Ischemic changes are present within the basal ganglia. No sign of acute extension. Left hemisphere  shows old small vessel infarctions in the basal ganglia and small-vessel changes of the white matter. There is a small region of old ischemic change in the left parieto-occipital cortical and subcortical brain. Vascular: There is atherosclerotic calcification of the major vessels at the base of the brain. Skull: Negative Sinuses/Orbits: Clear/normal Other: None IMPRESSION: No acute finding by CT. Extensive old ischemic changes in the right hemisphere with atrophy, encephalomalacia and gliosis. Old small vessel infarctions in both basal ganglia and in the left cerebellum. Old left parieto-occipital cortical and subcortical infarction. Electronically Signed   By: Nelson Chimes M.D.   On: 02/28/2019 16:03   Mr Brain Wo Contrast  Result Date: 03/01/2019 CLINICAL DATA:  Coronary artery disease.  Encephalopathy. EXAM: MRI HEAD WITHOUT CONTRAST TECHNIQUE: Multiplanar, multiecho pulse sequences of the brain and surrounding structures were obtained without intravenous contrast. COMPARISON:  CT head without contrast 02/28/2019 FINDINGS: Brain: Multifocal infarcts are present. There are patchy areas of acute cortical nonhemorrhagic infarction over the right MCA territory. There is involvement of the left occipital lobe in the left PCA territory. There  is also scattered involvement of the right MCA territory, to a lesser extent than left. Areas of remote encephalomalacia involving the right MCA territory right occipital lobe are again noted. Remote lacunar infarcts are present in basal ganglia bilaterally. The ventricles are of proportionate to the degree of atrophy. The study is moderately degraded patient motion. Vascular: Flow is present in the major intracranial arteries. Skull and upper cervical spine: The craniocervical junction is normal. Upper cervical spine is within normal limits. Marrow signal is unremarkable. Sinuses/Orbits: The paranasal sinuses and mastoid air cells are clear. The globes and orbits are within normal limits. The IMPRESSION: 1. New multifocal areas of acute nonhemorrhagic infarction involving multiple vascular territories compatible suggesting a central embolic source. 2. Remote encephalomalacia of the right MCA and PCA territories. 3. Remote lacunar infarcts of the basal ganglia. Electronically Signed   By: San Morelle M.D.   On: 03/01/2019 10:53    Medications:    amLODipine  10 mg Oral Daily   aspirin  325 mg Oral Daily   atorvastatin  80 mg Oral q1800   carvedilol  6.25 mg Oral BID WC   clopidogrel  75 mg Oral Daily   folic acid  1 mg Oral Daily   levETIRAcetam  500 mg Oral BID   magnesium oxide  400 mg Oral Daily   multivitamin  1 tablet Oral Daily   thiamine  100 mg Oral Daily   Continuous Infusions:  heparin 1,150 Units/hr (03/02/19 0142)     LOS: 1 day   Avanni Turnbaugh A Ciria Bernardini  [facility]   *Please refer to [email or web address], password [email or web address] to get updated schedule on who will round on this patient, as hospitalists switch teams weekly. If 7PM-7AM, please contact night-coverage at [email or web address], password [email or web address] for any overnight needs.

## 2019-03-02 NOTE — Progress Notes (Signed)
Physical Therapy Treatment Patient Details Name: Douglas Fowler MRN: [id number] DOB: Mar 05, 1966 Today's Date: 03/02/2019    History of Present Illness Douglas Fowler is a 53 y.o. male with medical history significant for CAD s/p PCI with stents x 3, multiple ischemic CVA's, HTN, seizures, tobacco abuse, chronic alcohol abuse, who is admitted to [facility] on 02/28/19 with acute encephalopathy after from from home. MRI: new multifocal areas of acute nonhemorrhagic infarct involving multiple vascular territories    PT Comments    Pt largest limiting factor remains to vision.  Pt is slow and guarded, presents with cognitive deficits.  Strength is good but coordination and balance remain impaired.  Pt continues to benefit from skilled rehab in a post acute setting.      Follow Up Recommendations  SNF;Supervision/Assistance - 24 hour     Equipment Recommendations  None recommended by PT    Recommendations for Other Services OT consult;Speech consult     Precautions / Restrictions Precautions Precautions: Fall Precaution Comments: pt with cortical blindness Restrictions Weight Bearing Restrictions: No    Mobility  Bed Mobility Overal bed mobility: Modified Independent             General bed mobility comments: OOB in recliner upon arrival  Transfers Overall transfer level: Needs assistance Equipment used: Rolling walker (2 wheeled) Transfers: Sit to/from Stand Sit to Stand: Min guard         General transfer comment: Cues for hand placement to and from seated surface.    Ambulation/Gait Ambulation/Gait assistance: Min assist Gait Distance (Feet): 100 Feet Assistive device: Rolling walker (2 wheeled) Gait Pattern/deviations: Step-through pattern;Drifts right/left;Trunk flexed;Decreased stride length Gait velocity: decreased   General Gait Details: pt running into objects and unable to adequately correct, needed manual facilitation of RW for navigation and  was not following R and L cues.    Stairs             Wheelchair Mobility    Modified Rankin (Stroke Patients Only) Modified Rankin (Stroke Patients Only) Pre-Morbid Rankin Score: Moderate disability Modified Rankin: Moderately severe disability     Balance Overall balance assessment: Needs assistance Sitting-balance support: No upper extremity supported Sitting balance-Leahy Scale: Good     Standing balance support: Bilateral upper extremity supported Standing balance-Leahy Scale: Poor Standing balance comment: reliant on UE support                            Cognition Arousal/Alertness: Awake/alert Behavior During Therapy: Flat affect Overall Cognitive Status: Impaired/Different from baseline Area of Impairment: Orientation;Attention;Following commands;Memory;Safety/judgement;Awareness;Problem solving                 Orientation Level: Place;Time;Situation;Disoriented to Current Attention Level: Sustained Memory: Decreased short-term memory;Decreased recall of precautions Following Commands: Follows one step commands with increased time Safety/Judgement: Decreased awareness of safety;Decreased awareness of deficits Awareness: Intellectual Problem Solving: Slow processing;Requires verbal cues;Requires tactile cues;Difficulty sequencing;Decreased initiation General Comments: pt with slower processing noted, mild confusion with answering therapist's questions requiring repetition and simplification of statements      Exercises      General Comments        Pertinent Vitals/Pain Pain Assessment: No/denies pain Faces Pain Scale: Hurts little more Pain Location: headache Pain Descriptors / Indicators: Headache    Home Living Family/patient expects to be discharged to:: Skilled nursing facility Living Arrangements: Spouse/significant other Available Help at Discharge: Family;Available PRN/intermittently Type of Home: House Home Access:  Stairs  to enter Entrance Stairs-Rails: Right Home Layout: One level Home Equipment: Shower seat Additional Comments: wife works    Prior Function Level of Independence: Engineer, materials / Transfers Assistance Needed: ambulated with SPC in home and RW out of home ADL's / Homemaking Assistance Needed: per PT eval pt reports he was performing ADL without assist; reports to OT spouse was assisting with LB ADL, shower transfer, and was assisting with iADL     PT Goals (current goals can now be found in the care plan section) Acute Rehab PT Goals Patient Stated Goal: regain independence Potential to Achieve Goals: Good Progress towards PT goals: Progressing toward goals    Frequency    Min 3X/week      PT Plan Current plan remains appropriate    Co-evaluation              AM-PAC PT "6 Clicks" Mobility   Outcome Measure  Help needed turning from your back to your side while in a flat bed without using bedrails?: None Help needed moving from lying on your back to sitting on the side of a flat bed without using bedrails?: None Help needed moving to and from a bed to a chair (including a wheelchair)?: A Little Help needed standing up from a chair using your arms (e.g., wheelchair or bedside chair)?: A Little Help needed to walk in hospital room?: A Little Help needed climbing 3-5 steps with a railing? : A Lot 6 Click Score: 19    End of Session Equipment Utilized During Treatment: Gait belt Activity Tolerance: Patient tolerated treatment well Patient left: in bed;with bed alarm set;with call bell/phone within reach Nurse Communication: Mobility status PT Visit Diagnosis: Unsteadiness on feet (R26.81);Difficulty in walking, not elsewhere classified (R26.2);Hemiplegia and hemiparesis Hemiplegia - Right/Left: Left Hemiplegia - dominant/non-dominant: Non-dominant Hemiplegia - caused by: Cerebral infarction     Time: 1683-7290 PT Time Calculation (min) (ACUTE ONLY):  31 min  Charges:  $Gait Training: 8-22 mins $Therapeutic Activity: 8-22 mins                     Governor Rooks, PTA Acute Rehabilitation Services Pager [phone] Office [phone]     Douglas Fowler 03/02/2019, 5:20 PM

## 2019-03-02 NOTE — Progress Notes (Signed)
[facility] for Heparin  Indication: chest/ACS  No Known Allergies  Patient Measurements: Height: 5\' 7"  (170.2 cm) Weight: 167 lb 15.9 oz (76.2 kg) IBW/kg (Calculated) : 66.1 Vital Signs: Temp: 97.8 F (36.6 C) (03/31 0052) Temp Source: Oral (03/30 1535) BP: 142/88 (03/31 0052) Pulse Rate: 55 (03/31 0052)  Labs: Recent Labs    02/28/19 1641 03/01/19 0217 03/01/19 0853 03/01/19 1310 03/01/19 1841 03/02/19 0029  HGB 14.6 13.3  --   --   --   --   HCT 45.7 41.8  --   --   --   --   PLT 182 148*  --   --   --   --   LABPROT  --  16.1*  --   --   --   --   INR  --  1.3*  --   --   --   --   HEPARINUNFRC  --   --  0.43  --   --  0.24*  CREATININE 1.62* 1.46*  --   --   --  1.50*  TROPONINI 0.91* 1.07*  --  1.04* 0.79* 0.80*    Estimated Creatinine Clearance: 52.6 mL/min (A) (by C-G formula based on SCr of 1.5 mg/dL (H)).  Assessment: 53 y.o. male with elevated troponins and CVA for heparin  Goal of Therapy:  Heparin level 0.3-0.5 Monitor platelets by anticoagulation protocol: Yes   Plan:  Increase Heparin 1150 units/hr  Phillis Knack, PharmD, BCPS   03/02/2019, 1:28 AM

## 2019-03-02 NOTE — Progress Notes (Signed)
Carotid artery duplex and transcranial Doppler completed.  Refer to "CV Proc" under chart review to view preliminary results.  03/02/2019 3:56 PM Maudry Mayhew, MHA, RVT, RDCS, RDMS

## 2019-03-03 LAB — BASIC METABOLIC PANEL
ANION GAP: 10 (ref 5–15)
BUN: 9 mg/dL (ref 6–20)
CO2: 25 mmol/L (ref 22–32)
Calcium: 8.9 mg/dL (ref 8.9–10.3)
Chloride: 97 mmol/L — ABNORMAL LOW (ref 98–111)
Creatinine, Ser: 1.3 mg/dL — ABNORMAL HIGH (ref 0.61–1.24)
GFR calc Af Amer: >60 mL/min (ref >60)
GFR calc non Af Amer: >60 mL/min (ref >60)
Glucose, Bld: 98 mg/dL (ref 70–99)
Potassium: 4.4 mmol/L (ref 3.5–5.1)
Sodium: 132 mmol/L — ABNORMAL LOW (ref 135–145)

## 2019-03-03 LAB — URINE CULTURE: Culture: >=100000 — AB

## 2019-03-03 LAB — CBC
HCT: 40.8 % (ref 39.0–52.0)
Hemoglobin: 14.1 g/dL (ref 13.0–17.0)
MCH: 31.7 pg (ref 26.0–34.0)
MCHC: 34.6 g/dL (ref 30.0–36.0)
MCV: 91.7 fL (ref 80.0–100.0)
NRBC: 0 % (ref 0.0–0.2)
Platelets: 178 10*3/uL (ref 150–400)
RBC: 4.45 MIL/uL (ref 4.22–5.81)
RDW: 13.8 % (ref 11.5–15.5)
WBC: 10.2 10*3/uL (ref 4.0–10.5)

## 2019-03-03 MED ORDER — CEPHALEXIN 500 MG PO CAPS: 500.0000 mg | ORAL_CAPSULE | Freq: Two times a day (BID) | ORAL | Status: DC

## 2019-03-03 MED ORDER — CEPHALEXIN 500 MG PO CAPS
500.0000 mg | ORAL_CAPSULE | Freq: Two times a day (BID) | ORAL | Status: DC
  Administered 2019-03-03 – 2019-03-05 (×5): 500 mg via ORAL
  Filled 2019-03-03 (×5): qty 1

## 2019-03-03 NOTE — Progress Notes (Signed)
Occupational Therapy Treatment Patient Details Name: Douglas Fowler MRN: [id number] DOB: June 14, 1966 Today's Date: 03/03/2019    History of present illness Douglas Fowler is a 53 y.o. male with medical history significant for CAD s/p PCI with stents x 3, multiple ischemic CVA's, HTN, seizures, tobacco abuse, chronic alcohol abuse, who is admitted to [facility] on 02/28/19 with acute encephalopathy after from from home. MRI: new multifocal areas of acute nonhemorrhagic infarct involving multiple vascular territories   OT comments  Pt requires mod (A) for meal at this time due to visual and attention deficits. Pt provided full bath at sink level with visual (A) and max v/c cues.    Follow Up Recommendations  SNF;Supervision/Assistance - 24 hour    Equipment Recommendations  Other (comment)    Recommendations for Other Services      Precautions / Restrictions Precautions Precautions: Fall Precaution Comments: pt with cortical blindness       Mobility Bed Mobility Overal bed mobility: Needs Assistance             General bed mobility comments: sitting on EOB and attempting to stand without (A) x2 during session  Transfers Overall transfer level: Needs assistance   Transfers: Sit to/from Stand Sit to Stand: Min guard         General transfer comment: pt attempting to sit without locating the chair    Balance Overall balance assessment: Needs assistance         Standing balance support: No upper extremity supported Standing balance-Leahy Scale: Poor Standing balance comment: pt reliant on UE support from therapist                           ADL either performed or assessed with clinical judgement   ADL Overall ADL's : Needs assistance/impaired Eating/Feeding: Moderate assistance Eating/Feeding Details (indicate cue type and reason): pt decr grasp of utensil and picking food up by hand. pt forgetting to use utensil at times Grooming: Wash/dry  hands;Wash/dry face;Applying deodorant;Moderate assistance Grooming Details (indicate cue type and reason): static sitting with max cues for sequence Upper Body Bathing: Moderate assistance   Lower Body Bathing: Moderate assistance Lower Body Bathing Details (indicate cue type and reason): cues to hygiene detail Upper Body Dressing : Minimal assistance       Toilet Transfer: Moderate assistance             General ADL Comments: pt with visual and cognition deficits noted through the session     Vision   Vision Assessment?: Vision impaired- to be further tested in functional context Additional Comments: pt using central vision with functional task of eating and bathing   Perception     Praxis      Cognition Arousal/Alertness: Awake/alert Behavior During Therapy: Flat affect Overall Cognitive Status: Impaired/Different from baseline Area of Impairment: Orientation;Attention;Following commands;Memory;Safety/judgement;Awareness;Problem solving                 Orientation Level: Place;Time;Situation;Disoriented to Current Attention Level: Sustained Memory: Decreased short-term memory;Decreased recall of precautions Following Commands: Follows one step commands inconsistently;Follows one step commands with increased time Safety/Judgement: Decreased awareness of safety;Decreased awareness of deficits Awareness: Intellectual Problem Solving: Slow processing;Decreased initiation;Difficulty sequencing General Comments: pt attempting to sit without bed or chair behind him. pt attempting to pull pants up but unaware no pants don at this time. pt attempting to reach for objects not present        Exercises  Shoulder Instructions       General Comments      Pertinent Vitals/ Pain       Pain Assessment: No/denies pain  Home Living                                          Prior Functioning/Environment              Frequency  Min 2X/week         Progress Toward Goals  OT Goals(current goals can now be found in the care plan section)  Progress towards OT goals: Progressing toward goals  Acute Rehab OT Goals Patient Stated Goal: regain independence OT Goal Formulation: With patient Time For Goal Achievement: 03/16/19 Potential to Achieve Goals: Good ADL Goals Pt Will Perform Eating: with set-up;sitting Pt Will Perform Grooming: with min guard assist;standing Pt Will Perform Upper Body Dressing: with supervision;sitting Pt Will Perform Lower Body Dressing: with min guard assist;sit to/from stand Pt Will Transfer to Toilet: with min guard assist;ambulating;regular height toilet Pt Will Perform Toileting - Clothing Manipulation and hygiene: with min guard assist;sit to/from stand Additional ADL Goal #1: Pt will demonstrate environmental scanning to locate ADL items and to navigate environment with no more than min cues. Additional ADL Goal #2: Pt will follow 2-3 step commands with 75% accuracy during functional task.  Plan Discharge plan remains appropriate    Co-evaluation                 AM-PAC OT "6 Clicks" Daily Activity     Outcome Measure   Help from another person eating meals?: A Lot Help from another person taking care of personal grooming?: A Lot Help from another person toileting, which includes using toliet, bedpan, or urinal?: A Lot Help from another person bathing (including washing, rinsing, drying)?: A Lot Help from another person to put on and taking off regular upper body clothing?: A Lot Help from another person to put on and taking off regular lower body clothing?: A Lot 6 Click Score: 12    End of Session    OT Visit Diagnosis: Unsteadiness on feet (R26.81);Other symptoms and signs involving the nervous system (R29.898);Other symptoms and signs involving cognitive function   Activity Tolerance Patient tolerated treatment well   Patient Left in chair;with call bell/phone within  reach;with chair alarm set;with nursing/sitter in room   Nurse Communication Mobility status;Precautions        Time: 0109-3235 OT Time Calculation (min): 24 min  Charges: OT General Charges $OT Visit: 1 Visit OT Treatments $Self Care/Home Management : 23-37 mins   Jeri Modena, OTR/L  Acute Rehabilitation Services Pager: [phone] Office: [phone] .    Jeri Modena 03/03/2019, 1:23 PM

## 2019-03-03 NOTE — Progress Notes (Signed)
STROKE TEAM PROGRESS NOTE   INTERVAL HISTORY Patient is lying comfortably in bed. He has no new complaints. He remains cortically blind without any improvement in vision. Transcanal Doppler bubble study could not be done since he had very poor IV access. Transcranial Doppler study is suboptimal due to poor windows and low normal mean flow velocities  Vitals:   03/03/19 0106 03/03/19 0424 03/03/19 0922 03/03/19 1254  BP: (!) 201/100 (!) 178/73 (!) 139/107 (!) 156/84  Pulse: (!) 59 66 67 64  Resp: 17 18 17 16   Temp: 98 F (36.7 C) 97.8 F (36.6 C) 97.7 F (36.5 C) (!) 97.4 F (36.3 C)  TempSrc: Oral Oral Oral Oral  SpO2: 100% 99% 99% 97%  Weight:      Height:        CBC:  Recent Labs  Lab 02/28/19 1641 03/01/19 0217 03/03/19 0755  WBC 11.7* 10.5 10.2  NEUTROABS 9.7*  --   --   HGB 14.6 13.3 14.1  HCT 45.7 41.8 40.8  MCV 94.2 95.4 91.7  PLT 182 148* 384    Basic Metabolic Panel:  Recent Labs  Lab 03/01/19 0217 03/02/19 0029 03/03/19 0755  NA 139 137 132*  K 3.5 3.5 4.4  CL 105 102 97*  CO2 22 28 25   GLUCOSE 93 100* 98  BUN 14 18 9   CREATININE 1.46* 1.50* 1.30*  CALCIUM 8.5* 8.3* 8.9  MG 1.5* 2.0  --   PHOS 3.7  --   --    Lipid Panel:     Component Value Date/Time   CHOL 200 03/02/2019 0029   TRIG 213 (H) 03/02/2019 0029   HDL 37 (L) 03/02/2019 0029   CHOLHDL 5.4 03/02/2019 0029   VLDL 43 (H) 03/02/2019 0029   LDLCALC 120 (H) 03/02/2019 0029   HgbA1c:  Lab Results  Component Value Date   HGBA1C 5.2 03/02/2019   Urine Drug Screen:     Component Value Date/Time   LABOPIA NONE DETECTED 03/01/2019 1319   COCAINSCRNUR NONE DETECTED 03/01/2019 1319   LABBENZ NONE DETECTED 03/01/2019 1319   AMPHETMU NONE DETECTED 03/01/2019 1319   THCU POSITIVE (A) 03/01/2019 1319   LABBARB NONE DETECTED 03/01/2019 1319    Alcohol Level     Component Value Date/Time   ETH <10 02/28/2019 1641    IMAGING Vas US Carotid (at [facility] Only)  Result Date:  03/02/2019 Carotid Arterial Duplex Study Indications: CVA. Performing Technologist: Maudry Mayhew MHA, RDMS, RVT, RDCS  Examination Guidelines: A complete evaluation includes B-mode imaging, spectral Doppler, color Doppler, and power Doppler as needed of all accessible portions of each vessel. Bilateral testing is considered an integral part of a complete examination. Limited examinations for reoccurring indications may be performed as noted.  Right Carotid Findings: +----------+--------+--------+--------+-----------------------+--------+           PSV cm/sEDV cm/sStenosisDescribe               Comments +----------+--------+--------+--------+-----------------------+--------+ CCA Prox  61      12                                              +----------+--------+--------+--------+-----------------------+--------+ CCA Distal40      9               heterogenous and smooth         +----------+--------+--------+--------+-----------------------+--------+ ICA Prox  39      8               smooth and heterogenous         +----------+--------+--------+--------+-----------------------+--------+ ICA Distal27      9                                               +----------+--------+--------+--------+-----------------------+--------+ ECA       87      14                                              +----------+--------+--------+--------+-----------------------+--------+ +----------+--------+-------+----------------+-------------------+           PSV cm/sEDV cmsDescribe        Arm Pressure (mmHG) +----------+--------+-------+----------------+-------------------+ [id number]            Multiphasic, WNL                    +----------+--------+-------+----------------+-------------------+ +---------+--------+--------+--------------+ VertebralPSV cm/sEDV cm/sNot identified +---------+--------+--------+--------------+  Left Carotid Findings:  +----------+-------+-------+--------+---------------------------------+--------+           PSV    EDV    StenosisDescribe                         Comments           cm/s   cm/s                                                     +----------+-------+-------+--------+---------------------------------+--------+ CCA Prox  78     16             smooth and heterogenous                   +----------+-------+-------+--------+---------------------------------+--------+ CCA Distal60     13             smooth and heterogenous                   +----------+-------+-------+--------+---------------------------------+--------+ ICA Prox  59     21             irregular, heterogenous and                                               calcific                                  +----------+-------+-------+--------+---------------------------------+--------+ ICA Distal73     23                                                       +----------+-------+-------+--------+---------------------------------+--------+ ECA       78     12                                                       +----------+-------+-------+--------+---------------------------------+--------+ +----------+--------+--------+----------------+-------------------+  SubclavianPSV cm/sEDV cm/sDescribe        Arm Pressure (mmHG) +----------+--------+--------+----------------+-------------------+           80              Multiphasic, WNL                    +----------+--------+--------+----------------+-------------------+ +---------+--------+--+--------+-+---------+ VertebralPSV cm/s28EDV cm/s8Antegrade +---------+--------+--+--------+-+---------+  Summary: Right Carotid: Velocities in the right ICA are consistent with a 1-39% stenosis. Left Carotid: Velocities in the left ICA are consistent with a 1-39% stenosis. Vertebrals:  Left vertebral artery demonstrates antegrade flow. Right vertebral               artery was not visualized. Subclavians: Normal flow hemodynamics were seen in bilateral subclavian              arteries. *See table(s) above for measurements and observations.  Electronically signed by Antony Contras MD on 03/02/2019 at 5:02:59 PM.    Final    Vas [place] Transcranial Doppler  Result Date: 03/02/2019  Transcranial Doppler Indications: Stroke. Performing Technologist: Maudry Mayhew RDMS, RVT, RDCS  Examination Guidelines: A complete evaluation includes B-mode imaging, spectral Doppler, color Doppler, and power Doppler as needed of all accessible portions of each vessel. Bilateral testing is considered an integral part of a complete examination. Limited examinations for reoccurring indications may be performed as noted.  +----------+-------------+----------+-----------+------------------+ RIGHT TCD Right VM (cm)Depth (cm)Pulsatility     Comment       +----------+-------------+----------+-----------+------------------+ MCA           15.00                 1.37                       +----------+-------------+----------+-----------+------------------+ ACA          -42.00                 1.01                       +----------+-------------+----------+-----------+------------------+ Term ICA                                    Unable to insonate +----------+-------------+----------+-----------+------------------+ PCA                                         Unable to insonate +----------+-------------+----------+-----------+------------------+ Opthalmic     27.00                 1.50                       +----------+-------------+----------+-----------+------------------+ ICA siphon    27.00                 1.47                       +----------+-------------+----------+-----------+------------------+ Vertebral                                   Unable to insonate +----------+-------------+----------+-----------+------------------+   +----------+------------+----------+-----------+------------------+ LEFT TCD  Left VM (cm)Depth (cm)Pulsatility     Comment       +----------+------------+----------+-----------+------------------+ MCA  Unable to insonate +----------+------------+----------+-----------+------------------+ ACA          -33.00                1.33                       +----------+------------+----------+-----------+------------------+ Term ICA                                   Unable to insonate +----------+------------+----------+-----------+------------------+ PCA          16.00                 1.28                       +----------+------------+----------+-----------+------------------+ Opthalmic    32.00                 1.50                       +----------+------------+----------+-----------+------------------+ ICA siphon   19.00                 1.50                       +----------+------------+----------+-----------+------------------+ Vertebral                                  Unable to insonate +----------+------------+----------+-----------+------------------+  +------------+-------+------------------+             VM cm/s     Comment       +------------+-------+------------------+ Prox Basilar       Unable to insonate +------------+-------+------------------+ Summary:  Poor bony windows throughout limits evaluation . low normal mean flow velocities in majority of identified vessels of anterior and posterior cerebral circulation of unclear significance. *See table(s) above for measurements and observations.  Diagnosing physician: Antony Contras MD Electronically signed by Antony Contras MD on 03/02/2019 at 5:06:18 PM.    Final    2D Echocardiogram   1. The left ventricle has normal systolic function, with an ejection fraction of 55-60%. The cavity size was normal. Left ventricular diastolic parameters were normal.  2. The right  ventricle has normal systolic function. The cavity was normal. There is no increase in right ventricular wall thickness.  3. Mild thickening of the mitral valve leaflet.  4. The aortic valve is tricuspid. Mild thickening of the aortic valve. Mild calcification of the aortic valve. Aortic valve regurgitation is moderate by color flow Doppler.   PHYSICAL EXAM: Pleasant middle-age Caucasian male who appears not to be in distress. . Afebrile. Head is nontraumatic. Neck is supple without bruit.    Cardiac exam no murmur or gallop. Lungs are clear to auscultation. Distal pulses are well felt. Neurological Exam :  His awake alert oriented to time place and person. No aphasia or apraxia dysarthria. Eyes are slightly disconjugate but are full range of movements yet he appears to have cortical blindness with no light perception in either right. Does not blink to threat in either eye. Pupils both 4 mm sluggishly reactive. Fundi not visualized. Mild left lower facial weakness. Tongue midline. Motor system exam shows no upper or lower eczema to drift but mild weakness of left grip and intrinsic hand muscles. Orbits right over left upper extremity. Mild 4/5  weakness proximally in both lower extremities but left more than right.   ASSESSMENT/PLAN [name] is a 53 y.o. male with history of seizures, tobacco abuse, hypertension, CVA, CAD and chronic alcohol abuse presenting with confusion after not drinking x 3 days.   Stroke:   Multiple bilateral cortical infarcts embolic in pt with prior embolic infarcts and ETOH abuse with resultant cortical blindness. Old and new infarcts secondary to unknown source. Not an AC candidate d/t ETOH abuse and fall risk.  MRI  New multifocal various territory infarcts. Old BG lacunes. Old R MCA and PCA encephalomalacia  Carotid Doppler  pending  2D Echo EF 55-60%, no SOE mild thickening of the mitral valve leaflet and moderate aortic valve regurgitation  Carotid  dopplers B ICA 1-39% stenosis, VAs antegrade   TCD doppler w/ bubbles poor bony windows. Low normal mean flow velocities.   LDL 120  HgbA1c 5.2  IV heparin for VTE prophylaxis  aspirin 325 mg daily prior to admission, now on aspirin 81 mg daily and clopidogrel 75 mg daily  recommend aspirin 81 mg and plavix 75 mg daily x 3 months, then PLAVIX alone.   Therapy recommendations:  SNF  Disposition:  pending  NOTHING FURTHER TO ADD FROM THE STROKE STANDPOINT Follow-up Stroke Clinic at [facility] in 4 weeks. Office will call with appointment date and time. Order placed.  Hypertensive Urgency  BP 225/85 on admission  Remains elevated . Permissive hypertension (OK if < 220/120) but gradually normalize in 5-7 days . Long-term BP goal normotensive  Hyperlipidemia  Home meds:  lipitor 80, resumed in hospital  LDL 120, goal < 70  Continue statin at discharge  Other Stroke Risk Factors  Cigarette smoker, advised to stop smoking  Chronic ETOH abuse, advised to drink no more than 2 drink(s) a day. On CIWA  UDS positive for THC, advised not to smoke marijuana d/t stroke wrisk  Hx stroke/TIA  08/2018 - Multiple right sided infarcts - embolic - source unknown. Would not pursue further workup (TEE / Loop) for embolic source as pt is not a good candidate for anticoagulation due to ETOH hx and poor medical compliance and fall/safety concerns from poor vision.  Coronary artery disease s/p PCI w/ stents x 3  Other Active Problems  Acute encephalopathy. multifactorial - UTI  Seizure hx d/t ETOH use. Last drink 3 days ago. On keppra. On CIWA  UTI  Elevated troponin d/t demand. 0.79, 0.80 treated w/ IV heparin for 24-48 h for conservative treatment  AKI Cr 1.3  Hypomagnesemia 1.5 - supplement  Hospital day # 2  Command continue aspirin and Plavix for 3 months and then Plavix alone..will not recommend TEE loop recorder given patient's high fall risk status due  to blindness as well as ethanol abuse.stroke team will sign off. Kindly call for questions. Discussed with Dr. Saul Fordyce, MD Medical Director [place] Pager: [phone] 03/03/2019 1:11 PM   To contact Stroke Continuity provider, please refer to [email or web address] After hours, contact General Neurology

## 2019-03-03 NOTE — Progress Notes (Signed)
Physical Therapy Treatment Patient Details Name: Douglas Fowler MRN: [id number] DOB: [birth year] Today's Date: 03/03/2019    History of Present Illness Douglas Fowler is a 53 y.o. male with medical history significant for CAD s/p PCI with stents x 3, multiple ischemic CVA's, HTN, seizures, tobacco abuse, chronic alcohol abuse, who is admitted to [facility] on 02/28/19 with acute encephalopathy after from from home. MRI: new multifocal areas of acute nonhemorrhagic infarct involving multiple vascular territories    Douglas Fowler Comments    Douglas Fowler performed gait training and functional mobility.  Douglas Fowler continues to be impulsive and lacks vision.  He required guidance of RW throughout session.  Plan for SNF placement remains appropriate as he requires moderate assistance to navigate RW.      Follow Up Recommendations  SNF;Supervision/Assistance - 24 hour     Equipment Recommendations  None recommended by Douglas Fowler    Recommendations for Other Services OT consult;Speech consult     Precautions / Restrictions Precautions Precautions: Fall Precaution Comments: Douglas Fowler with cortical blindness Restrictions Weight Bearing Restrictions: No    Mobility  Bed Mobility Overal bed mobility: Needs Assistance             General bed mobility comments: Douglas Fowler seated in recliner chair on arrival.    Transfers Overall transfer level: Needs assistance Equipment used: Rolling walker (2 wheeled) Transfers: Sit to/from Stand Sit to Stand: Min guard         General transfer comment: Douglas Fowler attempting to sit without locating the chair, required step by step cueing to back completely and reach for arm rest on chair.    Ambulation/Gait Ambulation/Gait assistance: Mod assist Gait Distance (Feet): 100 Feet Assistive device: Rolling walker (2 wheeled) Gait Pattern/deviations: Step-through pattern;Drifts right/left;Trunk flexed;Decreased stride length Gait velocity: decreased   General Gait Details: Douglas Fowler running into  objects and unable to adequately correct, needed manual facilitation of RW for navigation and was not following R and L cues.    Stairs             Wheelchair Mobility    Modified Rankin (Stroke Patients Only)       Balance Overall balance assessment: Needs assistance Sitting-balance support: No upper extremity supported Sitting balance-Leahy Scale: Good     Standing balance support: No upper extremity supported Standing balance-Leahy Scale: Poor Standing balance comment: Douglas Fowler reliant on UE support from therapist                            Cognition Arousal/Alertness: Awake/alert Behavior During Therapy: Flat affect;Impulsive Overall Cognitive Status: Impaired/Different from baseline Area of Impairment: Orientation;Attention;Following commands;Memory;Safety/judgement;Awareness;Problem solving                 Orientation Level: Place;Time;Situation;Disoriented to Current Attention Level: Sustained Memory: Decreased short-term memory;Decreased recall of precautions Following Commands: Follows one step commands inconsistently;Follows one step commands with increased time Safety/Judgement: Decreased awareness of safety;Decreased awareness of deficits Awareness: Intellectual Problem Solving: Slow processing;Decreased initiation;Difficulty sequencing General Comments: Douglas Fowler continues to be impulsive during sessions.        Exercises General Exercises - Lower Extremity Long Arc Quad: AROM;Both;10 reps;Supine Hip Flexion/Marching: AROM;Both;10 reps;Supine Mini-Sqauts: AROM;Both;10 reps;Supine    General Comments        Pertinent Vitals/Pain Pain Assessment: No/denies pain    Home Living                      Prior Function  Douglas Fowler Goals (current goals can now be found in the care plan section) Acute Rehab Douglas Fowler Goals Patient Stated Goal: regain independence Potential to Achieve Goals: Good Progress towards Douglas Fowler goals: Progressing  toward goals    Frequency    Min 3X/week      Douglas Fowler Plan Current plan remains appropriate    Co-evaluation              AM-PAC Douglas Fowler "6 Clicks" Mobility   Outcome Measure  Help needed turning from your back to your side while in a flat bed without using bedrails?: None Help needed moving from lying on your back to sitting on the side of a flat bed without using bedrails?: None Help needed moving to and from a bed to a chair (including a wheelchair)?: A Little Help needed standing up from a chair using your arms (e.g., wheelchair or bedside chair)?: A Little Help needed to walk in hospital room?: A Little Help needed climbing 3-5 steps with a railing? : A Lot 6 Click Score: 19    End of Session Equipment Utilized During Treatment: Gait belt Activity Tolerance: Patient tolerated treatment well Patient left: in bed;with bed alarm set;with call bell/phone within reach Nurse Communication: Mobility status Douglas Fowler Visit Diagnosis: Unsteadiness on feet (R26.81);Difficulty in walking, not elsewhere classified (R26.2);Hemiplegia and hemiparesis Hemiplegia - Right/Left: Left Hemiplegia - dominant/non-dominant: Non-dominant Hemiplegia - caused by: Cerebral infarction     Time: 2992-4268 Douglas Fowler Time Calculation (min) (ACUTE ONLY): 17 min  Charges:  $Gait Training: 8-22 mins                     Governor Rooks, PTA Acute Rehabilitation Services Pager [phone] Office [phone]     Ulani Degrasse [name] 03/03/2019, 4:45 PM

## 2019-03-03 NOTE — Progress Notes (Addendum)
Progress Note    Douglas Fowler  DXI:[id number] DOB: [birth year]  DOA: 02/28/2019 PCP: Imagene Riches, NP    Brief Narrative:   Chief complaint: Confusion  Medical records reviewed and are as summarized below:  Douglas Fowler is an 53 y.o. male with pmh of  CAD s/p PCI with stents x 3, multiple ischemic CVA's, HTN, seizures, tobacco abuse, chronic alcohol abuse, who is admitted to [facility] on 02/28/19 with acute encephalopathy after from from home by way of EMS to [facility] ED for evaluation of confusion.    Assessment/Plan:   Principal Problem:   Acute encephalopathy Active Problems:   CVA (cerebral vascular accident) ([place])   Essential hypertension   AKI (acute kidney injury) ([place])   Elevated troponin   Chronic alcohol abuse   Acute lower UTI   1.  Acute encephalopathy: Patient presented with confusion only oriented to person.  Alcohol level undetectable. TSH within normal limits.  Urine drug screen positive for marijuana.  Suspect symptoms could be related with an acute stroke seen on MRI given concern for cortical blindness.  He was also found to have signs of urinary tract infection.  Suspect symptoms could likely be multifactorial in nature. -Sitter to bedside for patient safety  2.  Embolic CVA with cortical blindness, history of CVA with residual weakness: Acute.  Patient with previous history of CVA with left-sided weakness.  CT scan did not show any acute abnormalities, but MRI of the brain revealed multifocal areas of acute nonhemorrhagic infarction evolving multiple vascular territories compatible with a central embolic source.  Echocardiogram revealed EF of 55 to 60% with normal diastolic dysfunction.  Carotid Doppler without significant signs of stenosis. Trans cranial Doppler of poor quality.  Hemoglobin A1c noted to be 5.2 and LDL 120.  Goal LDL less than 70.   Suspecting possible cardioembolic source, but no telemetry data available due to patient taking off  telemetry strips.  Neurology was formally consulted and recommended patient to be on aspirin and Plavix for 3 months then continue on Plavix alone.  Neurology Dr. Leonie Man reported no further need of further work-up with TEE/loop recorder for embolic source as patient was not a good candidate for anticoagulation due to alcohol history, poor compliance, and high fall risk given gait disturbance with cortical blindness.  PT/OT recommending skilled nursing facility/24-hour supervision.  At this time patient lacks medical decision-making capacity. -Stroke order-set initiated   -Fall risk -Neurochecks -ASA, Plavix, and statin  -Continue PT/OT  -Blood cultures negative x24-hour -Neurology consulted, will follow-up for further recommendation -Advised patient against driving -Social work helping with possible placement awaiting family's decision on placement currently  3.  Elevated troponin 2/2 demand:Acute. Troponin  0.91-> 1.07->0.8->0.79.  Patient without complaints of chest pain.  Echocardiogram revealing EF of 55 to 60% with note of mild thickening of the mitral valve leaflet and moderate aortic valve regurgitation.  Patient initially treated with heparin drip but was discontinued on hospital day 2. -Will consider ACE-I/ARB kidney function improved -Appreciate cardiology consultative services, will follow for further recommendation  4.  Alcohol abuse, history of seizure: Last drink approximately 3 days prior to arrival.  Patient with previous history of alcohol abuse and suspected withdrawal seizures.  Per review of records neurology had recommended long-term AED during his last admission in 12/2018.  Alcohol level was noted to be undetectable. -CIWA protocols -Continue Keppra  5.  Hypertensive urgency: Improving.  Blood pressures noted to be elevated up to  225/85 on admission.  Blood pressures range from 156/76 - 201/100.  Home medications included Coreg and amlodipine.  Heart rates in the 50s to 60s    -Continue Coreg and amlodipine -Starting hydralazine 25 mg p.o. 3 times daily today -Hydralazine IV as needed  6.  Acute kidney injury: Acute patient presents with elevated creatinine up to 1.62 with BUN within normal limits on admission.   Baseline creatinine previously around 1.2.  Appears to be prerenal given FeNa 0.75. Creatinine trended downward after patient given IV fluids to 1.3.  Patient had been outside in the heat for last 2 days prior To admission.  Urinary tract infection and uncontrolled blood pressure may also be playing a factor.  -Continue IV fluids normal saline at 75 mL/h until current liter is finished -Encourage fluids -Recheck BMP in a.m.  7.  Urinary tract infection: Acute.  Patient was found to have abnormal urinalysis with greater than 100,000 E. coli which was pansensitive.  Patient had received 1 dose of Rocephin IV on 3/31. -Start Keflex 500 mg p.o. twice daily   8.  Dyslipidemia: Last LDL 104 checked in 08/2018.  Patient currently taking high-dose statin at home, but repeat check worsening LDL elevated at 120.  Goal LDL < 70.  Question patient compliance. -Continue atorvastatin 80 mg daily -Will need to follow better dietary restrictions   9.  Hypomagnesemia: Acute on chronic.  Magnesium noted to be 1.5.  Repeat check on 3/31 within normal limits. -Magnesium 400 mg daily  -Continue to monitor intermittently and replace as needed   Body mass index is 26.97 kg/m.   Family Communication/Anticipated D/C date and plan/Code Status   DVT prophylaxis: heparin subq Code Status: Full Code.  Family Communication: No family present at bedside Disposition Plan: To be determined   Medical Consultants:    Cardiology- Dr. Ellyn Hack  Neurology- Dr. Cheral Marker   Anti-Infectives:    Keflex-effective day 2  Subjective:   Patient reports that he has had no issues overnight and denies any complaints at this time.  Nursing report overnight: Patient confused pulling  at catheter and telemetry wires.  Ordering sitter this a.m. for patient safety.  Objective:  No telemetry data available.  Vitals:   03/03/19 0000 03/03/19 0106 03/03/19 0424 03/03/19 0922  BP:  (!) 201/100 (!) 178/73 (!) 139/107  Pulse: 70 (!) 59 66 67  Resp:  17 18 17   Temp:  98 F (36.7 C) 97.8 F (36.6 C) 97.7 F (36.5 C)  TempSrc:  Oral Oral Oral  SpO2:  100% 99% 99%  Weight:      Height:        Intake/Output Summary (Last 24 hours) at 03/03/2019 1055 Last data filed at 03/02/2019 2330 Gross per 24 hour  Intake 1221.25 ml  Output 500 ml  Net 721.25 ml   Filed Weights   02/28/19 1509 03/01/19 0500 03/02/19 0500  Weight: 84.8 kg 76.2 kg 78.1 kg    Exam: Constitutional: Middle-age male in NAD, calm, comfortable Eyes:  lids and conjunctivae normal.  Cortically blind. ENMT: Mucous membranes are moist. Posterior pharynx clear of any exudate or lesions.  Neck: normal, supple, no masses, no thyromegaly Respiratory: clear to auscultation bilaterally, no wheezing, no crackles. Normal respiratory effort. No accessory muscle use.  Cardiovascular: Regular rate and rhythm, no murmurs / rubs / gallops. No extremity edema. 2+ pedal pulses. No carotid bruits.  Abdomen: no tenderness, no masses palpated. No hepatosplenomegaly. Bowel sounds positive.  Musculoskeletal: no clubbing / cyanosis.  No joint deformity upper and lower extremities. Good ROM, no contractures. Normal muscle tone.  Skin: no rashes, lesions, ulcers. No induration Neurologic: CN 2-12 grossly intact. Sensation intact, DTR normal. Strength 5/5 in right upper and lower extremities.  4/5 in left upper and lower extremities. Psychiatric:  Confused. Alert and oriented x person only. Normal mood.  Medical decision-making capacity assessment:  1. Does the patient have a fair understanding of their medical issue which is being actively treated?  No 2. Does the patient understand the proposed treatment to that medical issue?   No 3. Does the patient understand possible alternatives to proposed treatment?  No 4. Does the patient understands the option of refusing the proposed treatment?  No 5. Does the patient appreciate risks and benefits of accepting/refusing proposed treatment?No  Are there any other factors such as depression, delusions, psychosis that may may affect the patient's decision?  history of alcohol abuse, acute CVA on top of previous CVA, and UTI could all be contributing to current altered state.    Overall assessment:     Definitively incapable  Clinical comments:    Data Reviewed:   I have personally reviewed following labs and imaging studies:  Labs: Labs show the following:   Basic Metabolic Panel: Recent Labs  Lab 02/28/19 1641 03/01/19 0217 03/02/19 0029 03/03/19 0755  NA 139 139 137 132*  K 3.8 3.5 3.5 4.4  CL 103 105 102 97*  CO2 24 22 28 25   GLUCOSE 103* 93 100* 98  BUN 15 14 18 9   CREATININE 1.62* 1.46* 1.50* 1.30*  CALCIUM 9.5 8.5* 8.3* 8.9  MG  --  1.5* 2.0  --   PHOS  --  3.7  --   --    GFR Estimated Creatinine Clearance: 60.7 mL/min (A) (by C-G formula based on SCr of 1.3 mg/dL (H)). Liver Function Tests: Recent Labs  Lab 02/28/19 1641  AST 24  ALT 11  ALKPHOS 98  BILITOT 0.9  PROT 6.7  ALBUMIN 3.5   No results for input(s): LIPASE, AMYLASE in the last 168 hours. Recent Labs  Lab 02/28/19 1641  AMMONIA 27   Coagulation profile Recent Labs  Lab 03/01/19 0217  INR 1.3*    CBC: Recent Labs  Lab 02/28/19 1641 03/01/19 0217 03/03/19 0755  WBC 11.7* 10.5 10.2  NEUTROABS 9.7*  --   --   HGB 14.6 13.3 14.1  HCT 45.7 41.8 40.8  MCV 94.2 95.4 91.7  PLT 182 148* 178   Cardiac Enzymes: Recent Labs  Lab 02/28/19 1641 03/01/19 0217 03/01/19 1310 03/01/19 1841 03/02/19 0029  TROPONINI 0.91* 1.07* 1.04* 0.79* 0.80*   BNP (last 3 results) No results for input(s): PROBNP in the last 8760 hours. CBG: Recent Labs  Lab 02/28/19 1642    GLUCAP 93   D-Dimer: No results for input(s): DDIMER in the last 72 hours. Hgb A1c: Recent Labs    03/02/19 0029  HGBA1C 5.2   Lipid Profile: Recent Labs    03/02/19 0029  CHOL 200  HDL 37*  LDLCALC 120*  TRIG 213*  CHOLHDL 5.4   Thyroid function studies: Recent Labs    03/01/19 0217  TSH 1.793   Anemia work up: No results for input(s): VITAMINB12, FOLATE, FERRITIN, TIBC, IRON, RETICCTPCT in the last 72 hours. Sepsis Labs: Recent Labs  Lab 02/28/19 1641 03/01/19 0217 03/03/19 0755  WBC 11.7* 10.5 10.2    Microbiology Recent Results (from the past 240 hour(s))  Urine culture  Status: Abnormal   Collection Time: 03/01/19  1:19 PM  Result Value Ref Range Status   Specimen Description URINE, CLEAN CATCH  Final   Special Requests   Final    NONE Performed at [facility], [address], [place], [place] [address]    Culture >=100,000 COLONIES/mL ESCHERICHIA COLI (A)  Final   Report Status 03/03/2019 FINAL  Final   Organism ID, Bacteria ESCHERICHIA COLI (A)  Final      Susceptibility   Escherichia coli - MIC*    AMPICILLIN 8 SENSITIVE Sensitive     CEFAZOLIN <=4 SENSITIVE Sensitive     CEFTRIAXONE <=1 SENSITIVE Sensitive     CIPROFLOXACIN >=4 RESISTANT Resistant     GENTAMICIN <=1 SENSITIVE Sensitive     IMIPENEM <=0.25 SENSITIVE Sensitive     NITROFURANTOIN <=16 SENSITIVE Sensitive     TRIMETH/SULFA <=20 SENSITIVE Sensitive     AMPICILLIN/SULBACTAM 4 SENSITIVE Sensitive     PIP/TAZO <=4 SENSITIVE Sensitive     Extended ESBL NEGATIVE Sensitive     * >=100,000 COLONIES/mL ESCHERICHIA COLI  Culture, blood (routine x 2)     Status: None (Preliminary result)   Collection Time: 03/02/19  1:30 PM  Result Value Ref Range Status   Specimen Description BLOOD LEFT ARM  Final   Special Requests   Final    BOTTLES DRAWN AEROBIC ONLY Blood Culture results may not be optimal due to an inadequate volume of blood received in culture bottles   Culture    Final    NO GROWTH < 24 HOURS Performed at [facility] [address] [place], [place] [address]    Report Status PENDING  Incomplete  Culture, blood (routine x 2)     Status: None (Preliminary result)   Collection Time: 03/02/19  1:40 PM  Result Value Ref Range Status   Specimen Description BLOOD LEFT HAND  Final   Special Requests   Final    BOTTLES DRAWN AEROBIC ONLY Blood Culture results may not be optimal due to an inadequate volume of blood received in culture bottles   Culture   Final    NO GROWTH < 24 HOURS Performed at [facility], [place] [address] [place], [place] [address]    Report Status PENDING  Incomplete    Procedures and diagnostic studies:  Vas US Carotid (at [place] Only)  Result Date: 03/02/2019 Carotid Arterial Duplex Study Indications: CVA. Performing Technologist: Maudry Mayhew MHA, RDMS, RVT, RDCS  Examination Guidelines: A complete evaluation includes B-mode imaging, spectral Doppler, color Doppler, and power Doppler as needed of all accessible portions of each vessel. Bilateral testing is considered an integral part of a complete examination. Limited examinations for reoccurring indications may be performed as noted.  Right Carotid Findings: +----------+--------+--------+--------+-----------------------+--------+             PSV cm/s EDV cm/s Stenosis Describe                Comments  +----------+--------+--------+--------+-----------------------+--------+  CCA Prox   61       12                                                  +----------+--------+--------+--------+-----------------------+--------+  CCA Distal 40       9  heterogenous and smooth           +----------+--------+--------+--------+-----------------------+--------+  ICA Prox   39       8                 smooth and heterogenous           +----------+--------+--------+--------+-----------------------+--------+  ICA Distal 27       9                                                    +----------+--------+--------+--------+-----------------------+--------+  ECA        87       14                                                  +----------+--------+--------+--------+-----------------------+--------+ +----------+--------+-------+----------------+-------------------+             PSV cm/s EDV cms Describe         Arm Pressure (mmHG)  +----------+--------+-------+----------------+-------------------+  Subclavian 118              Multiphasic, WNL                      +----------+--------+-------+----------------+-------------------+ +---------+--------+--------+--------------+  Vertebral PSV cm/s EDV cm/s Not identified  +---------+--------+--------+--------------+  Left Carotid Findings: +----------+-------+-------+--------+---------------------------------+--------+             PSV     EDV     Stenosis Describe                          Comments              cm/s    cm/s                                                         +----------+-------+-------+--------+---------------------------------+--------+  CCA Prox   78      16               smooth and heterogenous                     +----------+-------+-------+--------+---------------------------------+--------+  CCA Distal 60      13               smooth and heterogenous                     +----------+-------+-------+--------+---------------------------------+--------+  ICA Prox   59      21               irregular, heterogenous and                                                      calcific                                    +----------+-------+-------+--------+---------------------------------+--------+  ICA Distal 73      23                                                           +----------+-------+-------+--------+---------------------------------+--------+  ECA        78      12                                                           +----------+-------+-------+--------+---------------------------------+--------+  +----------+--------+--------+----------------+-------------------+  Subclavian PSV cm/s EDV cm/s Describe         Arm Pressure (mmHG)  +----------+--------+--------+----------------+-------------------+             80                Multiphasic, WNL                      +----------+--------+--------+----------------+-------------------+ +---------+--------+--+--------+-+---------+  Vertebral PSV cm/s 28 EDV cm/s 8 Antegrade  +---------+--------+--+--------+-+---------+  Summary: Right Carotid: Velocities in the right ICA are consistent with a 1-39% stenosis. Left Carotid: Velocities in the left ICA are consistent with a 1-39% stenosis. Vertebrals:  Left vertebral artery demonstrates antegrade flow. Right vertebral              artery was not visualized. Subclavians: Normal flow hemodynamics were seen in bilateral subclavian              arteries. *See table(s) above for measurements and observations.  Electronically signed by Antony Contras MD on 03/02/2019 at 5:02:59 PM.    Final    Vas [place] Transcranial Doppler  Result Date: 03/02/2019  Transcranial Doppler Indications: Stroke. Performing Technologist: Maudry Mayhew RDMS, RVT, RDCS  Examination Guidelines: A complete evaluation includes B-mode imaging, spectral Doppler, color Doppler, and power Doppler as needed of all accessible portions of each vessel. Bilateral testing is considered an integral part of a complete examination. Limited examinations for reoccurring indications may be performed as noted.  +----------+-------------+----------+-----------+------------------+  RIGHT TCD  Right VM (cm) Depth (cm) Pulsatility      Comment        +----------+-------------+----------+-----------+------------------+  MCA            15.00                   1.37                         +----------+-------------+----------+-----------+------------------+  ACA           -42.00                   1.01                          +----------+-------------+----------+-----------+------------------+  Term ICA                                        Unable to insonate  +----------+-------------+----------+-----------+------------------+  PCA  Unable to insonate  +----------+-------------+----------+-----------+------------------+  Opthalmic      27.00                   1.50                         +----------+-------------+----------+-----------+------------------+  ICA siphon     27.00                   1.47                         +----------+-------------+----------+-----------+------------------+  Vertebral                                       Unable to insonate  +----------+-------------+----------+-----------+------------------+  +----------+------------+----------+-----------+------------------+  LEFT TCD   Left VM (cm) Depth (cm) Pulsatility      Comment        +----------+------------+----------+-----------+------------------+  MCA                                            Unable to insonate  +----------+------------+----------+-----------+------------------+  ACA           -33.00                  1.33                         +----------+------------+----------+-----------+------------------+  Term ICA                                       Unable to insonate  +----------+------------+----------+-----------+------------------+  PCA           16.00                   1.28                         +----------+------------+----------+-----------+------------------+  Opthalmic     32.00                   1.50                         +----------+------------+----------+-----------+------------------+  ICA siphon    19.00                   1.50                         +----------+------------+----------+-----------+------------------+  Vertebral                                      Unable to insonate  +----------+------------+----------+-----------+------------------+   +------------+-------+------------------+               VM cm/s      Comment        +------------+-------+------------------+  Prox Basilar         Unable to insonate  +------------+-------+------------------+ Summary:  Poor bony windows throughout limits evaluation . low normal mean flow velocities in  majority of identified vessels of anterior and posterior cerebral circulation of unclear significance. *See table(s) above for measurements and observations.  Diagnosing physician: Antony Contras MD Electronically signed by Antony Contras MD on 03/02/2019 at 5:06:18 PM.    Final     Medications:    amLODipine  10 mg Oral Daily   aspirin EC  81 mg Oral Daily   atorvastatin  80 mg Oral q1800   carvedilol  6.25 mg Oral BID WC   cephALEXin  500 mg Oral Q12H   clopidogrel  75 mg Oral Daily   folic acid  1 mg Oral Daily   hydrALAZINE  25 mg Oral Q8H   levETIRAcetam  500 mg Oral BID   magnesium oxide  400 mg Oral Daily   multivitamin  1 tablet Oral Daily   thiamine  100 mg Oral Daily   Continuous Infusions:  sodium chloride 75 mL/hr at 03/03/19 0030     LOS: 2 days   Jaylise Peek A Tait Balistreri  [facility]   *Please refer to [email or web address], password [email or web address] to get updated schedule on who will round on this patient, as hospitalists switch teams weekly. If 7PM-7AM, please contact night-coverage at [email or web address], password [email or web address] for any overnight needs.

## 2019-03-03 NOTE — TOC Initial Note (Signed)
Transition of Care [facility]) - Initial/Assessment Note    Patient Details  Name: Douglas Fowler MRN: [id number] Date of Birth: Jan 14, 1966  Transition of Care [facility]) CM/SW Contact:    Geralynn Ochs, LCSW Phone Number: 03/03/2019, 2:12 PM  Clinical Narrative:  Patient from home with spouse, but will need short term rehab with potential for long term care, as family is unsure if the patient's wife will be able to provide care for him moving forward. CSW discussed plan with daughter, Loma Sousa. She is agreeable to SNF, preferably in [place] area, as the patient's wife will be going to stay there with her after she leaves the hospital (wife is also hospitalized at this time). Daughters to research facilities and get back to [place] with preferences for referrals to be sent out.                 Expected Discharge Plan: Skilled Nursing Facility Barriers to Discharge: Continued Medical Work up, Ship broker, Requiring sitter/restraints   Patient Goals and CMS Choice Patient states their goals for this hospitalization and ongoing recovery are:: patient unable to participate in goal setting at this time CMS Medicare.gov Compare Post Acute Care list provided to:: Patient Represenative (must comment) Choice offered to / list presented to : Adult Children  Expected Discharge Plan and Services Expected Discharge Plan: [place] Acute Care Choice: Hospice Living arrangements for the past 2 months: Single Family Home                          Prior Living Arrangements/Services Living arrangements for the past 2 months: Single Family Home Lives with:: Self, Spouse Patient language and need for interpreter reviewed:: No Do you feel safe going back to the place where you live?: Yes      Need for Family Participation in Patient Care: Yes (Comment) Care giver support system in place?: No (comment)   Criminal Activity/Legal Involvement Pertinent to Current  Situation/Hospitalization: No - Comment as needed  Activities of Daily Living      Permission Sought/Granted Permission sought to share information with : Facility Sport and exercise psychologist, Family Supports Permission granted to share information with : Yes, Verbal Permission Granted  Share Information with NAME: Loma Sousa  Permission granted to share info w AGENCY: SNF  Permission granted to share info w Relationship: Daughter     Emotional Assessment Appearance:: Appears stated age Attitude/Demeanor/Rapport: Unable to Assess Affect (typically observed): Unable to Assess Orientation: : Oriented to Self Alcohol / Substance Use: Not Applicable Psych Involvement: No (comment)  Admission diagnosis:  Transient alteration of awareness [R40.4] Diaphoresis [R61] EKG abnormalities [R94.31] Elevated troponin [R79.89] Patient Active Problem List   Diagnosis Date Noted  . Acute lower UTI 03/02/2019  . NSTEMI (non-ST elevated myocardial infarction) ([place]) 03/02/2019  . Elevated troponin 03/01/2019  . Chronic alcohol abuse 03/01/2019  . Acute encephalopathy 02/28/2019  . Alcohol withdrawal delirium ([place]) 12/11/2018  . Alcohol withdrawal ([place]) 12/11/2018  . Alcohol withdrawal seizure with complication, with unspecified complication ([place]) 81/12/7508  . Essential hypertension 08/05/2018  . CAD (coronary artery disease) 08/05/2018  . Tobacco abuse 08/05/2018  . Hypokalemia   . AKI (acute kidney injury) ([place])   . Acute blood loss anemia   . CVA (cerebral vascular accident) ([place]) 08/04/2018   PCP:  Imagene Riches, NP Pharmacy:   [facility] [place]  [place] Phone: [phone] Fax: [phone]     Social Determinants of Health (SDOH) Interventions    Readmission Risk Interventions No flowsheet data found.

## 2019-03-03 NOTE — Progress Notes (Signed)
OT Cancellation Note  Patient Details Name: COLEMAN KALAS MRN: [id number] DOB: [birth year]   Cancelled Treatment:    Reason Eval/Treat Not Completed: Other (comment)(RN request to hold) Pt just falling asleep and OT will reattempt later today  Richelle Ito, OTR/L  Acute Rehabilitation Services Pager: [phone] Office: [phone] .  03/03/2019, 8:39 AM

## 2019-03-04 LAB — BASIC METABOLIC PANEL
Anion gap: 9 (ref 5–15)
BUN: 9 mg/dL (ref 6–20)
CO2: 23 mmol/L (ref 22–32)
Calcium: 8.8 mg/dL — ABNORMAL LOW (ref 8.9–10.3)
Chloride: 103 mmol/L (ref 98–111)
Creatinine, Ser: 1.24 mg/dL (ref 0.61–1.24)
GFR calc Af Amer: >60 mL/min (ref >60)
GFR calc non Af Amer: >60 mL/min (ref >60)
Glucose, Bld: 103 mg/dL — ABNORMAL HIGH (ref 70–99)
Potassium: 3.5 mmol/L (ref 3.5–5.1)
Sodium: 135 mmol/L (ref 135–145)

## 2019-03-04 MED ORDER — TRAZODONE HCL 50 MG PO TABS
50.0000 mg | ORAL_TABLET | Freq: Every day | ORAL | Status: DC
  Administered 2019-03-04: 50 mg via ORAL
  Filled 2019-03-04: qty 1

## 2019-03-04 NOTE — Progress Notes (Signed)
Physical Therapy Treatment Patient Details Name: Douglas Fowler MRN: [id number] DOB: 12-11-1965 Today's Date: 03/04/2019    History of Present Illness Douglas Fowler is a 53 y.o. male with medical history significant for CAD s/p PCI with stents x 3, multiple ischemic CVA's, HTN, seizures, tobacco abuse, chronic alcohol abuse, who is admitted to [facility] on 02/28/19 with acute encephalopathy after from from home. MRI: new multifocal areas of acute nonhemorrhagic infarct involving multiple vascular territories    PT Comments    Pt performed gait training and functional mobility.  Pt performed multiple transfers after bout of urinary incontinence.  Pt continues to present with visual deficits and unclear if he can see anything at all.  Pt remains to benefit from skilled rehab at SNF to improve function and improve strategies to mobilize with vision loss.     Follow Up Recommendations  SNF;Supervision/Assistance - 24 hour     Equipment Recommendations  None recommended by PT    Recommendations for Other Services       Precautions / Restrictions Precautions Precautions: Fall Precaution Comments: pt with cortical blindness Restrictions Weight Bearing Restrictions: No    Mobility  Bed Mobility Overal bed mobility: Needs Assistance             General bed mobility comments: Pt seated in recliner chair on arrival.    Transfers Overall transfer level: Needs assistance Equipment used: Rolling walker (2 wheeled) Transfers: Sit to/from Stand Sit to Stand: Min guard         General transfer comment: pt attempting to sit without locating the chair, required step by step cueing to back completely and reach for arm rest on chair.    Ambulation/Gait Ambulation/Gait assistance: Mod assist;+2 safety/equipment Gait Distance (Feet): 100 Feet Assistive device: Rolling walker (2 wheeled) Gait Pattern/deviations: Step-through pattern;Drifts right/left;Trunk flexed;Decreased  stride length Gait velocity: decreased   General Gait Details: Pt running into objects and unable to adequately correct, needed manual facilitation of RW for navigation and was not following R and L cues.   Close chair follow as patient sits impulsively.     Stairs             Wheelchair Mobility    Modified Rankin (Stroke Patients Only)       Balance Overall balance assessment: Needs assistance Sitting-balance support: No upper extremity supported Sitting balance-Leahy Scale: Good       Standing balance-Leahy Scale: Poor                              Cognition Arousal/Alertness: Awake/alert Behavior During Therapy: Flat affect;Impulsive Overall Cognitive Status: Impaired/Different from baseline Area of Impairment: Orientation;Attention;Following commands;Memory;Safety/judgement;Awareness;Problem solving                 Orientation Level: Place;Time;Situation;Disoriented to Current Attention Level: Sustained Memory: Decreased short-term memory;Decreased recall of precautions Following Commands: Follows one step commands inconsistently;Follows one step commands with increased time Safety/Judgement: Decreased awareness of safety;Decreased awareness of deficits Awareness: Intellectual Problem Solving: Slow processing;Decreased initiation;Difficulty sequencing General Comments: Pt continues to be impulsive during sessions.        Exercises      General Comments        Pertinent Vitals/Pain Pain Assessment: No/denies pain Faces Pain Scale: Hurts little more Pain Location: headache Pain Descriptors / Indicators: Headache    Home Living  Prior Function            PT Goals (current goals can now be found in the care plan section) Acute Rehab PT Goals Patient Stated Goal: regain independence Potential to Achieve Goals: Good Progress towards PT goals: Progressing toward goals    Frequency    Min  3X/week      PT Plan Current plan remains appropriate    Co-evaluation              AM-PAC PT "6 Clicks" Mobility   Outcome Measure  Help needed turning from your back to your side while in a flat bed without using bedrails?: None Help needed moving from lying on your back to sitting on the side of a flat bed without using bedrails?: None Help needed moving to and from a bed to a chair (including a wheelchair)?: A Little Help needed standing up from a chair using your arms (e.g., wheelchair or bedside chair)?: A Little Help needed to walk in hospital room?: A Little Help needed climbing 3-5 steps with a railing? : A Lot 6 Click Score: 19    End of Session Equipment Utilized During Treatment: Gait belt Activity Tolerance: Patient tolerated treatment well Patient left: in bed;with bed alarm set;with call bell/phone within reach Nurse Communication: Mobility status PT Visit Diagnosis: Unsteadiness on feet (R26.81);Difficulty in walking, not elsewhere classified (R26.2);Hemiplegia and hemiparesis Hemiplegia - Right/Left: Left Hemiplegia - dominant/non-dominant: Non-dominant Hemiplegia - caused by: Cerebral infarction     Time: 0263-7858 PT Time Calculation (min) (ACUTE ONLY): 31 min  Charges:  $Gait Training: 8-22 mins $Therapeutic Activity: 8-22 mins                     Governor Rooks, PTA Acute Rehabilitation Services Pager [phone] Office [phone]     Douglas Fowler Douglas Fowler 03/04/2019, 5:49 PM

## 2019-03-04 NOTE — Progress Notes (Signed)
Progress Note    Douglas Fowler  QJJ:[id number] DOB: 04-Dec-1965  DOA: 02/28/2019 PCP: Imagene Riches, NP    Brief Narrative:   Chief complaint: Confusion  Medical records reviewed and are as summarized below:  Douglas Fowler is an 53 y.o. male with pmh of  CAD s/p PCI with stents x 3, multiple ischemic CVA's, HTN, seizures, tobacco abuse, chronic alcohol abuse and was found to have acute CVAs.    Assessment/Plan:   Principal Problem:   Acute encephalopathy Active Problems:   CVA (cerebral vascular accident) ([place])   Essential hypertension   AKI (acute kidney injury) ([place])   Elevated troponin   Chronic alcohol abuse   Acute lower UTI    Acute encephalopathy: Alcohol level undetectable. TSH within normal limits.  Urine drug screen positive for marijuana.  Suspect symptoms could be related with an acute stroke seen on MRI given concern for cortical blindness.  He was also found to have signs of urinary tract infection.  Suspect symptoms could likely be multifactorial in nature. -d/c sitter  Embolic CVA with cortical blindness, history of CVA with residual weakness:  -MRI of the brain revealed multifocal areas of acute nonhemorrhagic infarction evolving multiple vascular territories compatible with a central embolic source.  -Echocardiogram: EF of 55 to 60% with normal diastolic dysfunction.   -Carotid Doppler without significant signs of stenosis.  -Trans cranial Doppler of poor quality.   -Hemoglobin A1c noted to be 5.2 and LDL 120.  Goal LDL less than 70. -Neurology: recommended patient to be on aspirin and Plavix for 3 months then continue on Plavix alone.   -Dr. Leonie Man reported no further need of further work-up with TEE/loop recorder for embolic source as patient was not a good candidate for anticoagulation due to alcohol history, poor compliance, and high fall risk given gait disturbance with cortical blindness. -PT/OT- SNF   Elevated troponin 2/2 demand:Acute. Troponin   0.91-> 1.07->0.8->0.79.  Patient without complaints of chest pain.  Echocardiogram revealing EF of 55 to 60% with note of mild thickening of the mitral valve leaflet and moderate aortic valve regurgitation.   -cardiology: Continue to monitor BP - & increase Hydralazine   Alcohol abuse, history of seizure: Last drink approximately 3 days prior to arrival.  Patient with previous history of alcohol abuse and suspected withdrawal seizures.  Per review of records neurology had recommended long-term AED during his last admission in 12/2018.  Alcohol level was noted to be undetectable. -CIWA protocols -Continue Keppra   Hypertensive urgency: Improving.  -Continue Coreg and amlodipine -hydralazine 25 mg p.o. TID   Urinary tract infection: Acute.  Patient was found to have abnormal urinalysis with greater than 100,000 E. coli which was pansensitive.  Patient had received 1 dose of Rocephin IV on 3/31. -Start Keflex 500 mg p.o. twice daily   8.  Dyslipidemia: Last LDL 104 checked in 08/2018.  Patient currently taking high-dose statin at home, but repeat check worsening LDL elevated at 120.  Goal LDL < 70.  Question patient compliance. -Continue atorvastatin 80 mg daily -Will need to follow better dietary restrictions    Hypomagnesemia: Acute on chronic.   -replaced    Family Communication/Anticipated D/C date and plan/Code Status   DVT prophylaxis: heparin subq Code Status: Full Code.  Family Communication: No family present at bedside Disposition Plan: SNF when bed available   Medical Consultants:    Cardiology  Neurology     Subjective:   sleeping  Objective:  No  telemetry data available.  Vitals:   03/03/19 2333 03/04/19 0351 03/04/19 0535 03/04/19 0747  BP: (!) 184/89 (!) 156/87 (!) 156/78 120/71  Pulse: 62 65 63 63  Resp: 16 16  20   Temp: (!) 97.5 F (36.4 C) (!) 97.4 F (36.3 C)  98 F (36.7 C)  TempSrc: Oral Oral  Oral  SpO2: 99% 100%  96%  Weight:      Height:         Intake/Output Summary (Last 24 hours) at 03/04/2019 0916 Last data filed at 03/04/2019 0742 Gross per 24 hour  Intake 1340 ml  Output 1150 ml  Net 190 ml   Filed Weights   02/28/19 1509 03/01/19 0500 03/02/19 0500  Weight: 84.8 kg 76.2 kg 78.1 kg    Exam: Sleeping Comfortable appearing rrr No increased work of breathing    Data Reviewed:   I have personally reviewed following labs and imaging studies:  Labs: Labs show the following:   Basic Metabolic Panel: Recent Labs  Lab 02/28/19 1641 03/01/19 0217 03/02/19 0029 03/03/19 0755 03/04/19 0547  NA 139 139 137 132* 135  K 3.8 3.5 3.5 4.4 3.5  CL 103 105 102 97* 103  CO2 24 22 28 25 23   GLUCOSE 103* 93 100* 98 103*  BUN 15 14 18 9 9   CREATININE 1.62* 1.46* 1.50* 1.30* 1.24  CALCIUM 9.5 8.5* 8.3* 8.9 8.8*  MG  --  1.5* 2.0  --   --   PHOS  --  3.7  --   --   --    GFR Estimated Creatinine Clearance: 63.7 mL/min (by C-G formula based on SCr of 1.24 mg/dL). Liver Function Tests: Recent Labs  Lab 02/28/19 1641  AST 24  ALT 11  ALKPHOS 98  BILITOT 0.9  PROT 6.7  ALBUMIN 3.5   No results for input(s): LIPASE, AMYLASE in the last 168 hours. Recent Labs  Lab 02/28/19 1641  AMMONIA 27   Coagulation profile Recent Labs  Lab 03/01/19 0217  INR 1.3*    CBC: Recent Labs  Lab 02/28/19 1641 03/01/19 0217 03/03/19 0755  WBC 11.7* 10.5 10.2  NEUTROABS 9.7*  --   --   HGB 14.6 13.3 14.1  HCT 45.7 41.8 40.8  MCV 94.2 95.4 91.7  PLT 182 148* 178   Cardiac Enzymes: Recent Labs  Lab 02/28/19 1641 03/01/19 0217 03/01/19 1310 03/01/19 1841 03/02/19 0029  TROPONINI 0.91* 1.07* 1.04* 0.79* 0.80*   BNP (last 3 results) No results for input(s): PROBNP in the last 8760 hours. CBG: Recent Labs  Lab 02/28/19 1642  GLUCAP 93   D-Dimer: No results for input(s): DDIMER in the last 72 hours. Hgb A1c: Recent Labs    03/02/19 0029  HGBA1C 5.2   Lipid Profile: Recent Labs    03/02/19 0029    CHOL 200  HDL 37*  LDLCALC 120*  TRIG 213*  CHOLHDL 5.4   Thyroid function studies: No results for input(s): TSH, T4TOTAL, T3FREE, THYROIDAB in the last 72 hours.  Invalid input(s): FREET3 Anemia work up: No results for input(s): VITAMINB12, FOLATE, FERRITIN, TIBC, IRON, RETICCTPCT in the last 72 hours. Sepsis Labs: Recent Labs  Lab 02/28/19 1641 03/01/19 0217 03/03/19 0755  WBC 11.7* 10.5 10.2    Microbiology Recent Results (from the past 240 hour(s))  Urine culture     Status: Abnormal   Collection Time: 03/01/19  1:19 PM  Result Value Ref Range Status   Specimen Description URINE, [place]  Final  Special Requests   Final    NONE Performed at [facility], [place] [address] [place], [place] [address]    Culture >=100,000 COLONIES/mL ESCHERICHIA COLI (A)  Final   Report Status 03/03/2019 FINAL  Final   Organism ID, Bacteria ESCHERICHIA COLI (A)  Final      Susceptibility   Escherichia coli - MIC*    AMPICILLIN 8 SENSITIVE Sensitive     CEFAZOLIN <=4 SENSITIVE Sensitive     CEFTRIAXONE <=1 SENSITIVE Sensitive     CIPROFLOXACIN >=4 RESISTANT Resistant     GENTAMICIN <=1 SENSITIVE Sensitive     IMIPENEM <=0.25 SENSITIVE Sensitive     NITROFURANTOIN <=16 SENSITIVE Sensitive     TRIMETH/SULFA <=20 SENSITIVE Sensitive     AMPICILLIN/SULBACTAM 4 SENSITIVE Sensitive     PIP/TAZO <=4 SENSITIVE Sensitive     Extended ESBL NEGATIVE Sensitive     * >=100,000 COLONIES/mL ESCHERICHIA COLI  Culture, blood (routine x 2)     Status: None (Preliminary result)   Collection Time: 03/02/19  1:30 PM  Result Value Ref Range Status   Specimen Description BLOOD LEFT ARM  Final   Special Requests   Final    BOTTLES DRAWN AEROBIC ONLY Blood Culture results may not be optimal due to an inadequate volume of blood received in culture bottles   Culture   Final    NO GROWTH < 24 HOURS Performed at [facility] [address] [place], [place] [address]    Report Status  PENDING  Incomplete  Culture, blood (routine x 2)     Status: None (Preliminary result)   Collection Time: 03/02/19  1:40 PM  Result Value Ref Range Status   Specimen Description BLOOD LEFT HAND  Final   Special Requests   Final    BOTTLES DRAWN AEROBIC ONLY Blood Culture results may not be optimal due to an inadequate volume of blood received in culture bottles   Culture   Final    NO GROWTH < 24 HOURS Performed at [facility], [place] [address] [place], [place] [address]    Report Status PENDING  Incomplete    Procedures and diagnostic studies:  Vas US Carotid (at [facility] Only)  Result Date: 03/02/2019 Carotid Arterial Duplex Study Indications: CVA. Performing Technologist: Maudry Mayhew MHA, RDMS, RVT, RDCS  Examination Guidelines: A complete evaluation includes B-mode imaging, spectral Doppler, color Doppler, and power Doppler as needed of all accessible portions of each vessel. Bilateral testing is considered an integral part of a complete examination. Limited examinations for reoccurring indications may be performed as noted.  Right Carotid Findings: +----------+--------+--------+--------+-----------------------+--------+             PSV cm/s EDV cm/s Stenosis Describe                Comments  +----------+--------+--------+--------+-----------------------+--------+  CCA Prox   61       12                                                  +----------+--------+--------+--------+-----------------------+--------+  CCA Distal 40       9                 heterogenous and smooth           +----------+--------+--------+--------+-----------------------+--------+  ICA Prox   39  8                 smooth and heterogenous           +----------+--------+--------+--------+-----------------------+--------+  ICA Distal 27       9                                                   +----------+--------+--------+--------+-----------------------+--------+  ECA        87       14                                                   +----------+--------+--------+--------+-----------------------+--------+ +----------+--------+-------+----------------+-------------------+             PSV cm/s EDV cms Describe         Arm Pressure (mmHG)  +----------+--------+-------+----------------+-------------------+  Subclavian 118              Multiphasic, WNL                      +----------+--------+-------+----------------+-------------------+ +---------+--------+--------+--------------+  Vertebral PSV cm/s EDV cm/s Not identified  +---------+--------+--------+--------------+  Left Carotid Findings: +----------+-------+-------+--------+---------------------------------+--------+             PSV     EDV     Stenosis Describe                          Comments              cm/s    cm/s                                                         +----------+-------+-------+--------+---------------------------------+--------+  CCA Prox   78      16               smooth and heterogenous                     +----------+-------+-------+--------+---------------------------------+--------+  CCA Distal 60      13               smooth and heterogenous                     +----------+-------+-------+--------+---------------------------------+--------+  ICA Prox   59      21               irregular, heterogenous and                                                      calcific                                    +----------+-------+-------+--------+---------------------------------+--------+  ICA Distal 73      23                                                           +----------+-------+-------+--------+---------------------------------+--------+  ECA        78      12                                                           +----------+-------+-------+--------+---------------------------------+--------+ +----------+--------+--------+----------------+-------------------+  Subclavian PSV cm/s EDV cm/s Describe         Arm Pressure (mmHG)   +----------+--------+--------+----------------+-------------------+             80                Multiphasic, WNL                      +----------+--------+--------+----------------+-------------------+ +---------+--------+--+--------+-+---------+  Vertebral PSV cm/s 28 EDV cm/s 8 Antegrade  +---------+--------+--+--------+-+---------+  Summary: Right Carotid: Velocities in the right ICA are consistent with a 1-39% stenosis. Left Carotid: Velocities in the left ICA are consistent with a 1-39% stenosis. Vertebrals:  Left vertebral artery demonstrates antegrade flow. Right vertebral              artery was not visualized. Subclavians: Normal flow hemodynamics were seen in bilateral subclavian              arteries. *See table(s) above for measurements and observations.  Electronically signed by Antony Contras MD on 03/02/2019 at 5:02:59 PM.    Final    Vas [place] Transcranial Doppler  Result Date: 03/02/2019  Transcranial Doppler Indications: Stroke. Performing Technologist: Maudry Mayhew RDMS, RVT, RDCS  Examination Guidelines: A complete evaluation includes B-mode imaging, spectral Doppler, color Doppler, and power Doppler as needed of all accessible portions of each vessel. Bilateral testing is considered an integral part of a complete examination. Limited examinations for reoccurring indications may be performed as noted.  +----------+-------------+----------+-----------+------------------+  RIGHT TCD  Right VM (cm) Depth (cm) Pulsatility      Comment        +----------+-------------+----------+-----------+------------------+  MCA            15.00                   1.37                         +----------+-------------+----------+-----------+------------------+  ACA           -42.00                   1.01                         +----------+-------------+----------+-----------+------------------+  Term ICA                                        Unable to insonate   +----------+-------------+----------+-----------+------------------+  PCA                                             Unable to insonate  +----------+-------------+----------+-----------+------------------+  Opthalmic      27.00                   1.50                         +----------+-------------+----------+-----------+------------------+  ICA siphon     27.00                   1.47                         +----------+-------------+----------+-----------+------------------+  Vertebral                                       Unable to insonate  +----------+-------------+----------+-----------+------------------+  +----------+------------+----------+-----------+------------------+  LEFT TCD   Left VM (cm) Depth (cm) Pulsatility      Comment        +----------+------------+----------+-----------+------------------+  MCA                                            Unable to insonate  +----------+------------+----------+-----------+------------------+  ACA           -33.00                  1.33                         +----------+------------+----------+-----------+------------------+  Term ICA                                       Unable to insonate  +----------+------------+----------+-----------+------------------+  PCA           16.00                   1.28                         +----------+------------+----------+-----------+------------------+  Opthalmic     32.00                   1.50                         +----------+------------+----------+-----------+------------------+  ICA siphon    19.00                   1.50                         +----------+------------+----------+-----------+------------------+  Vertebral                                      Unable to insonate  +----------+------------+----------+-----------+------------------+  +------------+-------+------------------+               VM cm/s      Comment        +------------+-------+------------------+  Prox Basilar         Unable to insonate   +------------+-------+------------------+ Summary:  Poor bony windows throughout limits evaluation . low normal mean flow velocities in majority of identified vessels of anterior and posterior cerebral circulation of unclear significance. *See table(s) above for measurements and observations.  Diagnosing physician: Antony Contras MD Electronically signed by Antony Contras MD on 03/02/2019 at 5:06:18 PM.    Final     Medications:    amLODipine  10 mg Oral Daily   aspirin  EC  81 mg Oral Daily   atorvastatin  80 mg Oral q1800   carvedilol  6.25 mg Oral BID WC   cephALEXin  500 mg Oral Q12H   clopidogrel  75 mg Oral Daily   folic acid  1 mg Oral Daily   hydrALAZINE  25 mg Oral Q8H   levETIRAcetam  500 mg Oral BID   magnesium oxide  400 mg Oral Daily   multivitamin  1 tablet Oral Daily   thiamine  100 mg Oral Daily   traZODone  50 mg Oral QHS   Continuous Infusions:    LOS: 3 days   Geradine Girt  [facility]   *Please refer to [email or web address], password [email or web address] to get updated schedule on who will round on this patient, as hospitalists switch teams weekly. If 7PM-7AM, please contact night-coverage at [email or web address], password [email or web address] for any overnight needs.

## 2019-03-04 NOTE — Progress Notes (Signed)
Occupational Therapy Treatment Patient Details Name: Douglas Fowler MRN: [id number] DOB: Jun 30, 1966 Today's Date: 03/04/2019    History of present illness Douglas Fowler is a 53 y.o. male with medical history significant for CAD s/p PCI with stents x 3, multiple ischemic CVA's, HTN, seizures, tobacco abuse, chronic alcohol abuse, who is admitted to [facility] on 02/28/19 with acute encephalopathy after from from home. MRI: new multifocal areas of acute nonhemorrhagic infarct involving multiple vascular territories   OT comments  Session focused on training pt on compensatory techniques for self feeding. Used "clock" technique after declutering tray; hand over hand required. Would likely benefit from built up utensils. Pt will be more successful with finger foods. Pt states he is "unable to see anything". Will continue to follow acutely.   Follow Up Recommendations  SNF;Supervision/Assistance - 24 hour    Equipment Recommendations  Other (comment)    Recommendations for Other Services      Precautions / Restrictions Precautions Precautions: Fall       Mobility Bed Mobility                  Transfers                      Balance                                           ADL either performed or assessed with clinical judgement   ADL   Eating/Feeding: Moderate assistance Eating/Feeding Details (indicate cue type and reason): utilized "clock" method to orient pt to plate; hand over hand required to locate food and feed self. Pt not using B hands together well; Pt states he has difficulty "feeling thigns" with his R hand; pt would be more successful with finger foods                                   General ADL Comments: Pt apparently frustrated with difficulty with self feeding     Vision   Additional Comments: Apparently cortically blind per neuro notes   Perception     Praxis      Cognition Arousal/Alertness:  Awake/alert Behavior During Therapy: Flat affect;Impulsive Overall Cognitive Status: Impaired/Different from baseline                     Current Attention Level: Sustained Memory: Decreased short-term memory;Decreased recall of precautions   Safety/Judgement: Decreased awareness of safety;Decreased awareness of deficits Awareness: Intellectual Problem Solving: Slow processing;Decreased initiation;Difficulty sequencing          Exercises     Shoulder Instructions       General Comments      Pertinent Vitals/ Pain       Pain Assessment: No/denies pain  Home Living                                          Prior Functioning/Environment              Frequency  Min 2X/week        Progress Toward Goals  OT Goals(current goals can now be found in the care plan section)  Progress towards OT goals: Progressing  toward goals  Acute Rehab OT Goals Patient Stated Goal: regain independence OT Goal Formulation: With patient Time For Goal Achievement: 03/16/19 Potential to Achieve Goals: Good ADL Goals Pt Will Perform Eating: with set-up;sitting Pt Will Perform Grooming: with min guard assist;standing Pt Will Perform Upper Body Dressing: with supervision;sitting Pt Will Perform Lower Body Dressing: with min guard assist;sit to/from stand Pt Will Transfer to Toilet: with min guard assist;ambulating;regular height toilet Pt Will Perform Toileting - Clothing Manipulation and hygiene: with min guard assist;sit to/from stand Additional ADL Goal #1: Pt will demonstrate environmental scanning to locate ADL items and to navigate environment with no more than min cues. Additional ADL Goal #2: Pt will follow 2-3 step commands with 75% accuracy during functional task.  Plan Discharge plan remains appropriate    Co-evaluation                 AM-PAC OT "6 Clicks" Daily Activity     Outcome Measure   Help from another person eating meals?: A  Lot Help from another person taking care of personal grooming?: A Lot Help from another person toileting, which includes using toliet, bedpan, or urinal?: A Lot Help from another person bathing (including washing, rinsing, drying)?: A Lot Help from another person to put on and taking off regular upper body clothing?: A Lot Help from another person to put on and taking off regular lower body clothing?: A Lot 6 Click Score: 12    End of Session    OT Visit Diagnosis: Unsteadiness on feet (R26.81);Other symptoms and signs involving the nervous system (R29.898);Other symptoms and signs involving cognitive function   Activity Tolerance Patient tolerated treatment well   Patient Left in chair;with call bell/phone within reach;with chair alarm set   Nurse Communication Other (comment)(set up for trays)        Time: 1150-1210 OT Time Calculation (min): 20 min  Charges: OT General Charges $OT Visit: 1 Visit OT Treatments $Self Care/Home Management : 8-22 mins  Maurie Boettcher, OT/L   Acute OT Clinical Specialist [place] Pager [phone] Office [phone]    [facility] 03/04/2019, 2:49 PM

## 2019-03-05 MED ORDER — TRAZODONE HCL 50 MG PO TABS: 50.0000 mg | ORAL_TABLET | Freq: Every day | ORAL | Status: DC

## 2019-03-05 MED ORDER — HYDRALAZINE HCL 25 MG PO TABS: 25.0000 mg | ORAL_TABLET | Freq: Three times a day (TID) | ORAL | Status: DC

## 2019-03-05 MED ORDER — CLOPIDOGREL BISULFATE 75 MG PO TABS: 75.0000 mg | ORAL_TABLET | Freq: Every day | ORAL | Status: DC

## 2019-03-05 MED ORDER — ASPIRIN 81 MG PO TBEC: 81.0000 mg | DELAYED_RELEASE_TABLET | Freq: Every day | ORAL | Status: DC

## 2019-03-05 NOTE — Care Management Important Message (Signed)
Important Message  Patient Details  Name: Douglas Fowler MRN: [id number] Date of Birth: 24-Jun-1966   Medicare Important Message Given:  Yes    [name] 03/05/2019, 3:38 PM

## 2019-03-05 NOTE — Discharge Summary (Signed)
Physician Discharge Summary  Douglas Fowler HKV:[id number] DOB: 11-15-1966 DOA: 02/28/2019  PCP: Imagene Riches, NP  Admit date: 02/28/2019 Discharge date: 03/05/2019  Admitted From: home Discharge disposition: SNF   Recommendations for Outpatient Follow-Up:   aspirin and Plavix for 3 months then continue on Plavix alone.     Discharge Diagnosis:   Principal Problem:   Acute encephalopathy Active Problems:   CVA (cerebral vascular accident) ([place])   Essential hypertension   AKI (acute kidney injury) ([place])   Elevated troponin   Chronic alcohol abuse   Acute lower UTI    Discharge Condition: Improved.  Diet recommendation: Low sodium, heart healthy  Wound care: None.  Code status: Full.   History of Present Illness:   Douglas Fowler is a 53 y.o. male with medical history significant for CAD s/p PCI with stents x 3, multiple ischemic CVA's, HTN, seizures, tobacco abuse, chronic alcohol abuse, who is admitted to [facility] on 02/28/19 with acute encephalopathy after from from home by way of EMS to [facility] ED for evaluation of confusion.   In the context of patient's current encephalopathy, the following history is obtained via my discussions with the patient's wife, whom I spoke with over the phone, my discussions with the ED physician, and via chart review.   The patient's wife reports that the patient was at baseline state of health as well as baseline mental status until approximately 1430 this afternoon (3/29), at which time the patient's wife noted the patient to be confused relative to baseline mental status.  Wife reports that she had left the patient side for approximately 15 minutes, and upon returning to him noted the aforementioned confusion, which she reports was new in the interval relative to the patient's mental status at the time that she had left his side.  She felt that the patient may be diaphoretic, but also acknowledged that they were  outside and 80 degree temperatures at the time.  She did not note any associated tonic-clonic activity, tongue biting, or loss of bowel/bladder function.  She reports that the patient had been without complaint in the days leading up to the above, including no known chest pain, shortness of breath, palpitations, or n/v.  Patient's wife acknowledges that the patient does have a history of alcohol withdrawal seizures associated with postictal states that it is not similar to the above.  She notes that the patient typically drinks 1/3 gallon per day of whiskey, and that his most recent consumption of alcohol occurred 3 days ago, on 02/25/2019.  No recent trauma.  Additionally, the patient's wife reports that the patient is on Keppra as an outpatient, and that he has been compliant in taking this medication as prescribed, without any known recent missed doses.  In terms of other notable past medical history, wife reports that the patient has a history of multiple acute ischemic strokes, with residual slurred speech and left hemiparesis.  Additionally, wife reports that the patient has a history of coronary artery disease and underwent PCI with stent placement x 3 " about 4 to 5 years ago".  Most recent echocardiogram occurred in September 2019, and showed LVEF 50 to 55%, no focal wall motion normalities, normal diastolic fucntion, mild mitral regurgitation, and a mildly dilated left atrium.    Hospital Course by Problem:   Acute encephalopathy: Alcohol level undetectable. TSH within normal limits.  Urine drug screen positive for marijuana.  Suspect symptoms could be related with  an acute stroke seen on MRI given concern for cortical blindness.  He was also found to have signs of urinary tract infection.  Suspect symptoms could likely be multifactorial in nature. -d/c sitter  Embolic CVA with cortical blindness, history of CVA with residual weakness:  -MRI of the brain revealed multifocal areas of acute  nonhemorrhagic infarction evolving multiple vascular territories compatible with a central embolic source.  -Echocardiogram: EF of 55 to 60% with normal diastolic dysfunction.   -Carotid Doppler without significant signs of stenosis.  -Trans cranial Doppler of poor quality.   -Hemoglobin A1c noted to be 5.2 and LDL 120.  Goal LDL less than 70. -Neurology: recommended patient to be on aspirin and Plavix for 3 months then continue on Plavix alone.   -Dr. Leonie Man reported no further need of further work-up with TEE/loop recorder for embolic source as patient was not a good candidate for anticoagulation due to alcohol history, poor compliance, and high fall risk given gait disturbance with cortical blindness. -PT/OT- SNF   Elevated troponin 2/2 demand:Acute. Troponin  0.91-> 1.07->0.8->0.79.  Patient without complaints of chest pain.  Echocardiogram revealing EF of 55 to 60% with note of mild thickening of the mitral valve leaflet and moderate aortic valve regurgitation.   -cardiology: Continue to monitor BP - & increase Hydralazine   Alcohol abuse, history of seizure: Last drink approximately 3 days prior to arrival.  Patient with previous history of alcohol abuse and suspected withdrawal seizures.  Per review of records neurology had recommended long-term AED during his last admission in 12/2018.  Alcohol level was noted to be undetectable. -CIWA protocols -Continue Keppra   Hypertensive urgency: Improving.  -Continue Coreg and amlodipine -hydralazine 25 mg p.o. TID   Urinary tract infection: Acute.  Patient was found to have abnormal urinalysis with greater than 100,000 E. coli which was pansensitive.  Patient had received 1 dose of Rocephin IV on 3/31. -treated with PO keflex  Dyslipidemia: Last LDL 104 checked in 08/2018.  Patient currently taking high-dose statin at home, but repeat check worsening LDL elevated at 120.  Goal LDL < 70.  Question patient compliance. -Continue atorvastatin 80  mg daily    Hypomagnesemia: Acute on chronic.   -replaced    Medical Consultants:   Neurology cardiology   Discharge Exam:   Vitals:   03/05/19 0419 03/05/19 0756  BP: (!) 143/72 (!) 148/77  Pulse: (!) 57 (!) 57  Resp: 18 16  Temp: 97.6 F (36.4 C) (!) 97 F (36.1 C)  SpO2: 98% 98%   Vitals:   03/04/19 2008 03/04/19 2357 03/05/19 0419 03/05/19 0756  BP: (!) 149/77 (!) 122/48 (!) 143/72 (!) 148/77  Pulse: 63 (!) 58 (!) 57 (!) 57  Resp: 20 18 18 16   Temp: 98.8 F (37.1 C) 98 F (36.7 C) 97.6 F (36.4 C) (!) 97 F (36.1 C)  TempSrc: Oral Oral Oral Axillary  SpO2: 97% 98% 98% 98%  Weight:      Height:        General exam: Appears calm and comfortable.  The results of significant diagnostics from this hospitalization (including imaging, microbiology, ancillary and laboratory) are listed below for reference.     Procedures and Diagnostic Studies:   Dg Chest 2 View  Result Date: 02/28/2019 CLINICAL DATA:  Acute mental status change EXAM: CHEST - 2 VIEW COMPARISON:  December 10, 2018 FINDINGS: Stable cardiomegaly. The hila, mediastinum, lungs, and pleura are otherwise unremarkable. IMPRESSION: No acute abnormalities.  Stable cardiomegaly. Electronically  Signed   By: Dorise Bullion III M.D   On: 02/28/2019 16:17   Ct Head Wo Contrast  Result Date: 02/28/2019 CLINICAL DATA:  Altered mental status. 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 EXAM: CT HEAD WITHOUT CONTRAST TECHNIQUE: Contiguous axial images were obtained from the base of the skull through the vertex without intravenous contrast. COMPARISON:  12/15/2018 FINDINGS: Brain: No acute finding by CT. The brainstem shows wall air in degeneration on the right. Few old small vessel cerebellar infarctions. The right cerebral hemisphere shows extensive old ischemic changes throughout the right middle cerebral artery territory. Ischemic changes are present within the basal ganglia. No sign of acute extension. Left  hemisphere shows old small vessel infarctions in the basal ganglia and small-vessel changes of the white matter. There is a small region of old ischemic change in the left parieto-occipital cortical and subcortical brain. Vascular: There is atherosclerotic calcification of the major vessels at the base of the brain. Skull: Negative Sinuses/Orbits: Clear/normal Other: None IMPRESSION: No acute finding by CT. Extensive old ischemic changes in the right hemisphere with atrophy, encephalomalacia and gliosis. Old small vessel infarctions in both basal ganglia and in the left cerebellum. Old left parieto-occipital cortical and subcortical infarction. Electronically Signed   By: Nelson Chimes M.D.   On: 02/28/2019 16:03   Mr Brain Wo Contrast  Result Date: 03/01/2019 CLINICAL DATA:  Coronary artery disease.  Encephalopathy. EXAM: MRI HEAD WITHOUT CONTRAST TECHNIQUE: Multiplanar, multiecho pulse sequences of the brain and surrounding structures were obtained without intravenous contrast. COMPARISON:  CT head without contrast 02/28/2019 FINDINGS: Brain: Multifocal infarcts are present. There are patchy areas of acute cortical nonhemorrhagic infarction over the right MCA territory. There is involvement of the left occipital lobe in the left PCA territory. There is also scattered involvement of the right MCA territory, to a lesser extent than left. Areas of remote encephalomalacia involving the right MCA territory right occipital lobe are again noted. Remote lacunar infarcts are present in basal ganglia bilaterally. The ventricles are of proportionate to the degree of atrophy. The study is moderately degraded patient motion. Vascular: Flow is present in the major intracranial arteries. Skull and upper cervical spine: The craniocervical junction is normal. Upper cervical spine is within normal limits. Marrow signal is unremarkable. Sinuses/Orbits: The paranasal sinuses and mastoid air cells are clear. The globes and orbits  are within normal limits. The IMPRESSION: 1. New multifocal areas of acute nonhemorrhagic infarction involving multiple vascular territories compatible suggesting a central embolic source. 2. Remote encephalomalacia of the right MCA and PCA territories. 3. Remote lacunar infarcts of the basal ganglia. Electronically Signed   By: San Morelle M.D.   On: 03/01/2019 10:53     Labs:   Basic Metabolic Panel: Recent Labs  Lab 02/28/19 1641 03/01/19 0217 03/02/19 0029 03/03/19 0755 03/04/19 0547  NA 139 139 137 132* 135  K 3.8 3.5 3.5 4.4 3.5  CL 103 105 102 97* 103  CO2 24 22 28 25 23   GLUCOSE 103* 93 100* 98 103*  BUN 15 14 18 9 9   CREATININE 1.62* 1.46* 1.50* 1.30* 1.24  CALCIUM 9.5 8.5* 8.3* 8.9 8.8*  MG  --  1.5* 2.0  --   --   PHOS  --  3.7  --   --   --    GFR Estimated Creatinine Clearance: 63.7 mL/min (by C-G formula based on SCr of 1.24 mg/dL). Liver Function Tests: Recent Labs  Lab 02/28/19 1641  AST 24  ALT 11  ALKPHOS 98  BILITOT 0.9  PROT 6.7  ALBUMIN 3.5   No results for input(s): LIPASE, AMYLASE in the last 168 hours. Recent Labs  Lab 02/28/19 1641  AMMONIA 27   Coagulation profile Recent Labs  Lab 03/01/19 0217  INR 1.3*    CBC: Recent Labs  Lab 02/28/19 1641 03/01/19 0217 03/03/19 0755  WBC 11.7* 10.5 10.2  NEUTROABS 9.7*  --   --   HGB 14.6 13.3 14.1  HCT 45.7 41.8 40.8  MCV 94.2 95.4 91.7  PLT 182 148* 178   Cardiac Enzymes: Recent Labs  Lab 02/28/19 1641 03/01/19 0217 03/01/19 1310 03/01/19 1841 03/02/19 0029  TROPONINI 0.91* 1.07* 1.04* 0.79* 0.80*   BNP: Invalid input(s): POCBNP CBG: Recent Labs  Lab 02/28/19 1642  GLUCAP 93   D-Dimer No results for input(s): DDIMER in the last 72 hours. Hgb A1c No results for input(s): HGBA1C in the last 72 hours. Lipid Profile No results for input(s): CHOL, HDL, LDLCALC, TRIG, CHOLHDL, LDLDIRECT in the last 72 hours. Thyroid function studies No results for input(s):  TSH, T4TOTAL, T3FREE, THYROIDAB in the last 72 hours.  Invalid input(s): FREET3 Anemia work up No results for input(s): VITAMINB12, FOLATE, FERRITIN, TIBC, IRON, RETICCTPCT in the last 72 hours. Microbiology Recent Results (from the past 240 hour(s))  Urine culture     Status: Abnormal   Collection Time: 03/01/19  1:19 PM  Result Value Ref Range Status   Specimen Description URINE, CLEAN CATCH  Final   Special Requests   Final    NONE Performed at [facility], [address], [place], [place] [address]    Culture >=100,000 COLONIES/mL ESCHERICHIA COLI (A)  Final   Report Status 03/03/2019 FINAL  Final   Organism ID, Bacteria ESCHERICHIA COLI (A)  Final      Susceptibility   Escherichia coli - MIC*    AMPICILLIN 8 SENSITIVE Sensitive     CEFAZOLIN <=4 SENSITIVE Sensitive     CEFTRIAXONE <=1 SENSITIVE Sensitive     CIPROFLOXACIN >=4 RESISTANT Resistant     GENTAMICIN <=1 SENSITIVE Sensitive     IMIPENEM <=0.25 SENSITIVE Sensitive     NITROFURANTOIN <=16 SENSITIVE Sensitive     TRIMETH/SULFA <=20 SENSITIVE Sensitive     AMPICILLIN/SULBACTAM 4 SENSITIVE Sensitive     PIP/TAZO <=4 SENSITIVE Sensitive     Extended ESBL NEGATIVE Sensitive     * >=100,000 COLONIES/mL ESCHERICHIA COLI  Culture, blood (routine x 2)     Status: None (Preliminary result)   Collection Time: 03/02/19  1:30 PM  Result Value Ref Range Status   Specimen Description BLOOD LEFT ARM  Final   Special Requests   Final    BOTTLES DRAWN AEROBIC ONLY Blood Culture results may not be optimal due to an inadequate volume of blood received in culture bottles   Culture   Final    NO GROWTH 2 DAYS Performed at [facility] [address], [place], [place] [address]    Report Status PENDING  Incomplete  Culture, blood (routine x 2)     Status: None (Preliminary result)   Collection Time: 03/02/19  1:40 PM  Result Value Ref Range Status   Specimen Description BLOOD LEFT HAND  Final   Special Requests    Final    BOTTLES DRAWN AEROBIC ONLY Blood Culture results may not be optimal due to an inadequate volume of blood received in culture bottles   Culture   Final  NO GROWTH 2 DAYS Performed at [facility], [place] [address] [place], [place] [address]    Report Status PENDING  Incomplete     Discharge Instructions:   Discharge Instructions    Ambulatory referral to Neurology   Complete by:  As directed    Follow up with stroke clinic NP (Lorimer Tiberio Vanschaick or Cecille Rubin, if both not available, consider Dr. Antony Contras, Dr. Bess Harvest, or Dr. Sarina Ill) at [facility] in about 4 weeks.  Planned d/c to SNF when stable. Neuro s/o today.   Diet - low sodium heart healthy   Complete by:  As directed    Increase activity slowly   Complete by:  As directed      Allergies as of 03/05/2019   No Known Allergies     Medication List    STOP taking these medications   aspirin 325 MG tablet Replaced by:  aspirin 81 MG EC tablet   hydrOXYzine 10 MG tablet Commonly known as:  ATARAX/VISTARIL     TAKE these medications   amLODipine 10 MG tablet Commonly known as:  NORVASC Take 1 tablet (10 mg total) by mouth daily.   aspirin 81 MG EC tablet Take 1 tablet (81 mg total) by mouth daily. Replaces:  aspirin 325 MG tablet   atorvastatin 80 MG tablet Commonly known as:  LIPITOR Take 1 tablet (80 mg total) by mouth daily at 6 PM.   carvedilol 6.25 MG tablet Commonly known as:  COREG Take 1 tablet (6.25 mg total) by mouth 2 (two) times daily with a meal.   clopidogrel 75 MG tablet Commonly known as:  PLAVIX Take 1 tablet (75 mg total) by mouth daily.   folic acid 1 MG tablet Commonly known as:  FOLVITE Take 1 tablet (1 mg total) by mouth daily.   hydrALAZINE 25 MG tablet Commonly known as:  APRESOLINE Take 1 tablet (25 mg total) by mouth every 8 (eight) hours.   levETIRAcetam 500 MG tablet Commonly known as:  KEPPRA Take 1 tablet (500 mg  total) by mouth 2 (two) times daily.   magnesium oxide 400 (241.3 Mg) MG tablet Commonly known as:  MAG-OX Take 1 tablet (400 mg total) by mouth 2 (two) times daily.   multivitamin Tabs tablet Take 1 tablet by mouth daily.   nicotine 21 mg/24hr patch Commonly known as:  NICODERM CQ - dosed in mg/24 hours Place 1 patch (21 mg total) onto the skin daily.   thiamine 100 MG tablet Take 1 tablet (100 mg total) by mouth daily.   traZODone 50 MG tablet Commonly known as:  DESYREL Take 1 tablet (50 mg total) by mouth at bedtime.      Follow-up Information    [facility] Follow up in 4 week(s).   Specialty:  Neurology Why:  stroke clinic. office will call with appt date and time.  Contact information: [address] [place] [phone]           Time coordinating discharge: 35 min  Signed:  Geradine Girt DO  [facility] 03/05/2019, 8:36 AM

## 2019-03-05 NOTE — TOC Transition Note (Signed)
Transition of Care [facility]) - CM/SW Discharge Note   Patient Details  Name: Douglas Fowler MRN: [id number] Date of Birth: Oct 31, 1966  [facility] of [facility]) CM/SW Contact:  Geralynn Ochs, LCSW Phone Number: 03/05/2019, 11:17 AM   Clinical Narrative: Nurse to call report to [phone], Room 221      Final next level of care: Skilled Nursing Facility Barriers to Discharge: No Barriers Identified   Patient Goals and CMS Choice Patient states their goals for this hospitalization and ongoing recovery are:: patient unable to participate in goal setting at this time CMS Medicare.gov Compare Post Acute Care list provided to:: Patient Represenative (must comment) Choice offered to / list presented to : Adult Children  Discharge Placement              Patient [facility] bed at: [facility] Patient to be transferred to facility by: [facility] Name of family member notified: Loma Sousa Patient and family notified of of transfer: 03/05/19  Discharge Plan and Services     Post Acute Care Choice: Hospice                    Social Determinants of Health (SDOH) Interventions     Readmission Risk Interventions No flowsheet data found.

## 2019-03-05 NOTE — Progress Notes (Signed)
Called report to [place], report given. Arlis Porta

## 2019-03-05 NOTE — Progress Notes (Signed)
Pt D/C from unit via stretcher with PTAR to [facility], All lines removed, with no complications. Arlis Porta

## 2019-03-05 NOTE — Plan of Care (Signed)
Adequate for discharge.

## 2019-03-07 LAB — CULTURE, BLOOD (ROUTINE X 2)
Culture: NO GROWTH
Culture: NO GROWTH

## 2019-04-20 ENCOUNTER — Telehealth: Payer: Medicare HMO | Admitting: Cardiology

## 2019-04-20 ENCOUNTER — Other Ambulatory Visit: Payer: Self-pay

## 2019-11-14 ENCOUNTER — Emergency Department (HOSPITAL_COMMUNITY)
Admission: EM | Admit: 2019-11-14 | Discharge: 2019-11-17 | Disposition: A | Discharge status: Other Acute Inpatient Hospital | Payer: Medicare Other | Attending: Emergency Medicine | Admitting: Emergency Medicine

## 2019-11-14 DIAGNOSIS — R4689 Other symptoms and signs involving appearance and behavior: Secondary | ICD-10-CM | POA: Diagnosis present

## 2019-11-14 DIAGNOSIS — Z20828 Contact with and (suspected) exposure to other viral communicable diseases: Secondary | ICD-10-CM | POA: Diagnosis not present

## 2019-11-14 DIAGNOSIS — R45850 Homicidal ideations: Secondary | ICD-10-CM | POA: Diagnosis not present

## 2019-11-14 DIAGNOSIS — I2510 Atherosclerotic heart disease of native coronary artery without angina pectoris: Secondary | ICD-10-CM | POA: Diagnosis not present

## 2019-11-14 DIAGNOSIS — Z7902 Long term (current) use of antithrombotics/antiplatelets: Secondary | ICD-10-CM | POA: Insufficient documentation

## 2019-11-14 DIAGNOSIS — I252 Old myocardial infarction: Secondary | ICD-10-CM | POA: Insufficient documentation

## 2019-11-14 DIAGNOSIS — F17210 Nicotine dependence, cigarettes, uncomplicated: Secondary | ICD-10-CM | POA: Insufficient documentation

## 2019-11-14 DIAGNOSIS — I10 Essential (primary) hypertension: Secondary | ICD-10-CM | POA: Diagnosis not present

## 2019-11-14 DIAGNOSIS — Z7982 Long term (current) use of aspirin: Secondary | ICD-10-CM | POA: Diagnosis not present

## 2019-11-14 DIAGNOSIS — Z008 Encounter for other general examination: Secondary | ICD-10-CM | POA: Diagnosis not present

## 2019-11-14 DIAGNOSIS — Z79899 Other long term (current) drug therapy: Secondary | ICD-10-CM | POA: Insufficient documentation

## 2019-11-14 DIAGNOSIS — F319 Bipolar disorder, unspecified: Secondary | ICD-10-CM | POA: Diagnosis not present

## 2019-11-14 LAB — COMPREHENSIVE METABOLIC PANEL
ALT: 12 U/L (ref 0–44)
AST: 13 U/L — ABNORMAL LOW (ref 15–41)
Albumin: 3.2 g/dL — ABNORMAL LOW (ref 3.5–5.0)
Alkaline Phosphatase: 119 U/L (ref 38–126)
Anion gap: 9 (ref 5–15)
BUN: 28 mg/dL — ABNORMAL HIGH (ref 6–20)
CO2: 27 mmol/L (ref 22–32)
Calcium: 8.7 mg/dL — ABNORMAL LOW (ref 8.9–10.3)
Chloride: 106 mmol/L (ref 98–111)
Creatinine, Ser: 1.55 mg/dL — ABNORMAL HIGH (ref 0.61–1.24)
GFR calc Af Amer: 58 mL/min — ABNORMAL LOW (ref >60)
GFR calc non Af Amer: 50 mL/min — ABNORMAL LOW (ref >60)
Glucose, Bld: 91 mg/dL (ref 70–99)
Potassium: 5 mmol/L (ref 3.5–5.1)
Sodium: 142 mmol/L (ref 135–145)
Total Bilirubin: 0.6 mg/dL (ref 0.3–1.2)
Total Protein: 5.7 g/dL — ABNORMAL LOW (ref 6.5–8.1)

## 2019-11-14 LAB — CBC
HCT: 38 % — ABNORMAL LOW (ref 39.0–52.0)
Hemoglobin: 12.1 g/dL — ABNORMAL LOW (ref 13.0–17.0)
MCH: 29.2 pg (ref 26.0–34.0)
MCHC: 31.8 g/dL (ref 30.0–36.0)
MCV: 91.6 fL (ref 80.0–100.0)
Platelets: 164 10*3/uL (ref 150–400)
RBC: 4.15 MIL/uL — ABNORMAL LOW (ref 4.22–5.81)
RDW: 13.7 % (ref 11.5–15.5)
WBC: 7.7 10*3/uL (ref 4.0–10.5)
nRBC: 0 % (ref 0.0–0.2)

## 2019-11-14 LAB — SALICYLATE LEVEL: Salicylate Lvl: <7 mg/dL (ref 2.8–30.0)

## 2019-11-14 LAB — ACETAMINOPHEN LEVEL: Acetaminophen (Tylenol), Serum: <10 ug/mL — ABNORMAL LOW (ref 10–30)

## 2019-11-14 LAB — ETHANOL: Alcohol, Ethyl (B): <10 mg/dL (ref <10)

## 2019-11-14 MED ORDER — LEVETIRACETAM 500 MG PO TABS
500.0000 mg | ORAL_TABLET | Freq: Two times a day (BID) | ORAL | Status: DC
  Administered 2019-11-15 – 2019-11-17 (×5): 500 mg via ORAL
  Filled 2019-11-14 (×5): qty 1

## 2019-11-14 MED ORDER — DESLORATADINE-PSEUDOEPHED ER 2.5-120 MG PO TB12
1000.00 | ORAL_TABLET | ORAL | Status: DC
Start: 2019-11-12 — End: 2019-11-14

## 2019-11-14 MED ORDER — AMPHOTER B CHOLEST SULF CMPLX
81.00 | Status: DC
Start: 2019-11-13 — End: 2019-11-14

## 2019-11-14 MED ORDER — CARVEDILOL 12.5 MG PO TABS
6.2500 mg | ORAL_TABLET | Freq: Two times a day (BID) | ORAL | Status: DC
  Administered 2019-11-15 – 2019-11-17 (×5): 6.25 mg via ORAL
  Filled 2019-11-14 (×5): qty 1

## 2019-11-14 MED ORDER — ALBA-3 EX
400.00 | CUTANEOUS | Status: DC
Start: 2019-11-13 — End: 2019-11-14

## 2019-11-14 MED ORDER — AMLODIPINE BESYLATE 5 MG PO TABS
10.0000 mg | ORAL_TABLET | Freq: Every day | ORAL | Status: DC
  Administered 2019-11-15 – 2019-11-17 (×3): 10 mg via ORAL
  Filled 2019-11-14 (×3): qty 2

## 2019-11-14 MED ORDER — MAGNESIUM OXIDE 400 (241.3 MG) MG PO TABS
400.0000 mg | ORAL_TABLET | Freq: Two times a day (BID) | ORAL | Status: DC
  Administered 2019-11-15 – 2019-11-17 (×5): 400 mg via ORAL
  Filled 2019-11-14 (×5): qty 1

## 2019-11-14 MED ORDER — KEFLEX 125 MG/5ML PO SUSR
1.00 | ORAL | Status: DC
Start: 2019-11-13 — End: 2019-11-14

## 2019-11-14 MED ORDER — SUBDUE PLUS PO LIQD
75.00 | ORAL | Status: DC
Start: 2019-11-13 — End: 2019-11-14

## 2019-11-14 MED ORDER — PHENYLEPHRINE-GUAIFENESIN 30-400 MG PO CP12
10.00 | ORAL_CAPSULE | ORAL | Status: DC
Start: 2019-11-13 — End: 2019-11-14

## 2019-11-14 MED ORDER — TROJAN ULTRA TEXTURE LUBRICATE MISC
6.25 | Status: DC
Start: 2019-11-12 — End: 2019-11-14

## 2019-11-14 MED ORDER — ATORVASTATIN CALCIUM 80 MG PO TABS
80.0000 mg | ORAL_TABLET | Freq: Every day | ORAL | Status: DC
  Administered 2019-11-15 – 2019-11-16 (×2): 80 mg via ORAL
  Filled 2019-11-14 (×2): qty 1

## 2019-11-14 MED ORDER — MP TRI-FED COLD 2.5-60 MG PO TABS
25.00 | ORAL_TABLET | ORAL | Status: DC
Start: ? — End: 2019-11-14

## 2019-11-14 MED ORDER — DIVALPROEX SODIUM 250 MG PO DR TAB
1000.0000 mg | DELAYED_RELEASE_TABLET | Freq: Every day | ORAL | Status: DC
  Administered 2019-11-15 – 2019-11-16 (×2): 1000 mg via ORAL
  Filled 2019-11-14 (×2): qty 4

## 2019-11-14 MED ORDER — PYRILAMINE TAN-PHENYLEPH TAN 30-5 MG/5ML PO SUSP
50.00 | ORAL | Status: DC
Start: 2019-11-12 — End: 2019-11-14

## 2019-11-14 MED ORDER — CVS KIDPANT BOYS X-LARGE MISC
80.00 | Status: DC
Start: 2019-11-12 — End: 2019-11-14

## 2019-11-14 MED ORDER — DM-GG-POT CITRATE-CITRIC ACID PO
500.00 | ORAL | Status: DC
Start: 2019-11-12 — End: 2019-11-14

## 2019-11-14 MED ORDER — TRAZODONE HCL 50 MG PO TABS: 50.0000 mg | ORAL_TABLET | Freq: Every day | ORAL | Status: DC

## 2019-11-14 MED ORDER — DERMAKLENZ EX
100.00 | CUTANEOUS | Status: DC
Start: 2019-11-13 — End: 2019-11-14

## 2019-11-14 MED ORDER — OSCO ANTI-NAUSEA 1.87-1.87-21.5 OR SOLN
25.00 | ORAL | Status: DC
Start: 2019-11-12 — End: 2019-11-14

## 2019-11-14 MED ORDER — ASPIRIN EC 81 MG PO TBEC
81.0000 mg | DELAYED_RELEASE_TABLET | Freq: Every day | ORAL | Status: DC
  Administered 2019-11-15 – 2019-11-17 (×3): 81 mg via ORAL
  Filled 2019-11-14 (×3): qty 1

## 2019-11-14 MED ORDER — CLOPIDOGREL BISULFATE 75 MG PO TABS
75.0000 mg | ORAL_TABLET | Freq: Every day | ORAL | Status: DC
  Administered 2019-11-15 – 2019-11-17 (×3): 75 mg via ORAL
  Filled 2019-11-14 (×3): qty 1

## 2019-11-14 MED ORDER — BUPROPION HCL ER (SR) 150 MG PO TB12
150.0000 mg | ORAL_TABLET | Freq: Two times a day (BID) | ORAL | Status: DC
  Administered 2019-11-15: 10:00:00 150 mg via ORAL
  Filled 2019-11-14: qty 1

## 2019-11-14 NOTE — ED Notes (Signed)
TTS in progress 

## 2019-11-14 NOTE — ED Notes (Signed)
Spoke to pt, denies SI/HI, denies visual/auditory hallucinations. States "I'm not crazy or anything. I just need them to look at my eyes. 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 Tells nurse he is unaware of other reasons that he came to ED

## 2019-11-14 NOTE — ED Notes (Signed)
Asked patient to give urine sample. Patient did not give sample, but did urinate.
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
## 2019-11-14 NOTE — Progress Notes (Signed)
[name], NP recommends continued observation for safety and stabilization and to be reassessed in the AM by psych pending collateral contact. EDP [name], MD and pt's nurse [name], RN have been advised.  Lind Covert, MSW, LCSW Therapeutic Triage Specialist  [phone]
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
## 2019-11-15 ENCOUNTER — Other Ambulatory Visit: Payer: Self-pay

## 2019-11-15 LAB — SARS CORONAVIRUS 2 (TAT 6-24 HRS): SARS Coronavirus 2: NEGATIVE

## 2019-11-15 MED ORDER — TRAZODONE HCL 100 MG PO TABS
100.0000 mg | ORAL_TABLET | Freq: Every day | ORAL | Status: DC
  Administered 2019-11-15 – 2019-11-16 (×2): 100 mg via ORAL
  Filled 2019-11-15 (×2): qty 1

## 2019-11-15 MED ORDER — RISPERIDONE 1 MG PO TABS
1.0000 mg | ORAL_TABLET | Freq: Two times a day (BID) | ORAL | Status: DC
  Administered 2019-11-15 – 2019-11-17 (×5): 1 mg via ORAL
  Filled 2019-11-15 (×5): qty 1

## 2019-11-15 MED ORDER — BUPROPION HCL ER (SR) 100 MG PO TB12
100.0000 mg | ORAL_TABLET | Freq: Two times a day (BID) | ORAL | Status: DC
  Administered 2019-11-15 – 2019-11-17 (×4): 100 mg via ORAL
  Filled 2019-11-15 (×6): qty 1

## 2019-11-15 MED ORDER — ACETAMINOPHEN 325 MG PO TABS
650.0000 mg | ORAL_TABLET | Freq: Four times a day (QID) | ORAL | Status: DC | PRN
  Administered 2019-11-15: 03:00:00 650 mg via ORAL
  Filled 2019-11-15: qty 2

## 2019-11-15 NOTE — ED Notes (Signed)
tts complete and pt is showering
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
## 2019-11-15 NOTE — ED Notes (Signed)
[facility] called, states that we will watch pt overnight and reevaluate in the morning. They will speak with wife tonight also.
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
## 2019-11-15 NOTE — ED Notes (Signed)
Dinner tray ordered.

## 2019-11-15 NOTE — ED Notes (Signed)
Lunch tray ordered 

## 2019-11-15 NOTE — Progress Notes (Addendum)
Pt meets inpatient criteria per [name], NP. Referral information has been sent to the following hospitals for review:  [facility] Details  [facility] Details  [facility] Details  [facility] Details  [facility] Details  [facility] Office Details [facility] Details  [facility]  Disposition will continue to assist with inpatient placement needs.   Audree Camel, LCSW, [place] Disposition [facility]/TTS [phone]
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
## 2019-11-15 NOTE — ED Notes (Signed)
Breakfast Ordered 

## 2019-11-15 NOTE — ED Notes (Signed)
Patient stumbled out of room and was standing in front of neighbor's room staring; when asked what he was doing he states "I'm trying to stay out of his way" pt was slightly agitated but was redirected back to his room; Pt states "I don't have a room.-Monique,RN

## 2019-11-16 LAB — RAPID URINE DRUG SCREEN, HOSP PERFORMED
Amphetamines: NOT DETECTED
Barbiturates: NOT DETECTED
Benzodiazepines: POSITIVE — AB
Cocaine: NOT DETECTED
Opiates: NOT DETECTED
Tetrahydrocannabinol: NOT DETECTED

## 2019-11-16 LAB — VALPROIC ACID LEVEL: Valproic Acid Lvl: 63 ug/mL (ref 50.0–100.0)

## 2019-11-16 NOTE — ED Notes (Signed)
Pt woke - ambulated to bathroom - ate breakfast - then returned to sleeping.

## 2019-11-16 NOTE — ED Notes (Signed)
Pt ambulated to bathroom and back to room w/o difficulty.  

## 2019-11-16 NOTE — ED Notes (Addendum)
Pt spoke w/his spouse and daughter, Bethann Berkshire. Advised daughter pt states he wants to leave and go home. Daughter states this is what he does when he is at home as well. Ate dinner. Re-TTS completed. Pt ambulated to bathroom and back to room w/o difficulty.

## 2019-11-16 NOTE — ED Notes (Signed)
[place] SW advised [place] NP may be planning to assess pt so no IVC needed as of yet.

## 2019-11-16 NOTE — ED Notes (Signed)
Pt attempted to call his spouse - no answer.

## 2019-11-16 NOTE — BH Assessment (Signed)
Pt presents sitting up on side of his bed. His speech is loud. Pt states he is doing fine. Reports he thought he was coming to get his eyes checked because he can't see. Pt states he has "no idea" why he is here. Pt denies HI "I love everybody". He denies SI "I love myself too much". Pt denies feelings of paranoia. Inpt tx continues to be recommended. He will be reassessed in the morning.

## 2019-11-17 ENCOUNTER — Observation Stay (HOSPITAL_COMMUNITY)
Admission: AD | Admit: 2019-11-17 | Discharge: 2019-11-18 | Disposition: A | Discharge status: Home / Self Care | Payer: Medicare Other | Source: Intra-hospital | Attending: Psychiatry | Admitting: Psychiatry

## 2019-11-17 ENCOUNTER — Other Ambulatory Visit: Payer: Self-pay

## 2019-11-17 ENCOUNTER — Encounter (HOSPITAL_COMMUNITY): Payer: Self-pay | Admitting: Psychiatry

## 2019-11-17 DIAGNOSIS — F329 Major depressive disorder, single episode, unspecified: Secondary | ICD-10-CM | POA: Diagnosis present

## 2019-11-17 MED ORDER — TRAZODONE HCL 100 MG PO TABS
100.0000 mg | ORAL_TABLET | Freq: Every day | ORAL | Status: DC
  Administered 2019-11-17: 100 mg via ORAL
  Filled 2019-11-17: qty 1

## 2019-11-17 MED ORDER — RISPERIDONE 1 MG PO TABS
1.0000 mg | ORAL_TABLET | Freq: Two times a day (BID) | ORAL | Status: DC
  Administered 2019-11-17 – 2019-11-18 (×2): 1 mg via ORAL
  Filled 2019-11-17 (×2): qty 1

## 2019-11-17 MED ORDER — ASPIRIN EC 81 MG PO TBEC
81.0000 mg | DELAYED_RELEASE_TABLET | Freq: Every day | ORAL | Status: DC
  Administered 2019-11-18: 09:00:00 81 mg via ORAL
  Filled 2019-11-17: qty 1

## 2019-11-17 MED ORDER — AMLODIPINE BESYLATE 5 MG PO TABS
10.0000 mg | ORAL_TABLET | Freq: Every day | ORAL | Status: DC
  Administered 2019-11-18: 08:00:00 10 mg via ORAL
  Filled 2019-11-17: qty 2

## 2019-11-17 MED ORDER — LEVETIRACETAM 500 MG PO TABS
500.0000 mg | ORAL_TABLET | Freq: Two times a day (BID) | ORAL | Status: DC
  Administered 2019-11-17 – 2019-11-18 (×2): 500 mg via ORAL
  Filled 2019-11-17 (×2): qty 1

## 2019-11-17 MED ORDER — ATORVASTATIN CALCIUM 40 MG PO TABS
ORAL_TABLET | ORAL | Status: AC
  Filled 2019-11-17: qty 2

## 2019-11-17 MED ORDER — ATORVASTATIN CALCIUM 10 MG PO TABS
80.0000 mg | ORAL_TABLET | Freq: Every day | ORAL | Status: DC
  Administered 2019-11-17: 18:00:00 80 mg via ORAL

## 2019-11-17 MED ORDER — CLOPIDOGREL BISULFATE 75 MG PO TABS
75.0000 mg | ORAL_TABLET | Freq: Every day | ORAL | Status: DC
  Administered 2019-11-18: 09:00:00 75 mg via ORAL
  Filled 2019-11-17 (×4): qty 1

## 2019-11-17 MED ORDER — BUPROPION HCL ER (SR) 100 MG PO TB12
100.0000 mg | ORAL_TABLET | Freq: Two times a day (BID) | ORAL | Status: DC
  Administered 2019-11-17 – 2019-11-18 (×2): 100 mg via ORAL
  Filled 2019-11-17 (×8): qty 1

## 2019-11-17 MED ORDER — CARVEDILOL 3.125 MG PO TABS
6.2500 mg | ORAL_TABLET | Freq: Two times a day (BID) | ORAL | Status: DC
  Administered 2019-11-17 – 2019-11-18 (×2): 6.25 mg via ORAL
  Filled 2019-11-17 (×2): qty 2

## 2019-11-17 MED ORDER — MAGNESIUM OXIDE 400 (241.3 MG) MG PO TABS
400.0000 mg | ORAL_TABLET | Freq: Two times a day (BID) | ORAL | Status: DC
  Administered 2019-11-17 – 2019-11-18 (×2): 400 mg via ORAL
  Filled 2019-11-17 (×7): qty 1

## 2019-11-17 MED ORDER — ACETAMINOPHEN 325 MG PO TABS: 650.0000 mg | ORAL_TABLET | Freq: Four times a day (QID) | ORAL | Status: DC | PRN

## 2019-11-17 MED ORDER — DIVALPROEX SODIUM 500 MG PO DR TAB
1000.0000 mg | DELAYED_RELEASE_TABLET | Freq: Every day | ORAL | Status: DC
  Administered 2019-11-17: 1000 mg via ORAL
  Filled 2019-11-17: qty 2
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
## 2019-11-17 NOTE — Progress Notes (Signed)
Patient ID: Douglas Fowler, male   DOB: 09-15-1966, 53 y.o.   MRN: [id number] D: Pt in bed sleeping at onset of shift, denies AVH, and in response to if he was having thoughts of wanting to kill herself, he responded, "I love myself too much".    A: Pt given all of his medications for this shift, and is continuing to rest in bed with no signs of distress.  Q15 minute safety checks being maintained.  R: Q15 minute checks to be continued trough entire shift and any needs will be addressed as they arise.

## 2019-11-17 NOTE — ED Notes (Signed)
Safe Transport transporting to [facility] - ALL belongings - 1 labeled belongings bag - NO Valuables Envelope noted - Pt aware.

## 2019-11-17 NOTE — ED Notes (Signed)
Pt in shower.  

## 2019-11-17 NOTE — ED Notes (Signed)
Attempted to call report - advised to call back in 15 min.

## 2019-11-17 NOTE — Plan of Care (Signed)
[facility] Observation Crisis Plan  Reason for Crisis Plan:  Crisis Stabilization   Plan of Care:  Referral for Inpatient Hospitalization  Family Support:     Current Living Environment: Wife and child  Insurance:   Hospital Account    Name Acct ID Class Status Primary Coverage   Kannin, Qin [id number] [employer]        Guarantor Account (for Hospital Account [id number])    Name Relation to Pt Service Area Active? Acct Type   Tarri Fuller Self [facility] Yes Behavioral Health   Address Phone       [address], [place] [address] [phone])          Coverage Information (for Hospital Account [id number])    F/O Payor/Plan Precert #   [facility] [place] #   Dionysius, Cambray [id number]   Address [place]   [address] [place] [address] [phone]      Legal Guardian:     Primary Care Provider:  Imagene Riches, NP  Current Outpatient Providers:  None  Psychiatrist:     Counselor/Therapist:     Compliant with Medications:  Yes  Additional Information:   Marissa Calamity 12/16/20204:54 PM

## 2019-11-17 NOTE — ED Notes (Signed)
Pt is up and down going top bathroom. Pt keeps saying he needs to go home.Redirect pt back to room and told him he would be talking to counselor in the am.

## 2019-11-17 NOTE — Progress Notes (Signed)
Pt to be transported to [place]    Dr. Dwyane Dee is the accepting provider.  Dr. Meredith Pel is the attending provider.    Call report to [phone]    [facility] @ [facility] ED notified.     Pt is voluntary and will be transported to [place]  Pt may arrive as soon as possible to [facility] OBS.  Audree Camel, LCSW, [place] Disposition [facility]/TTS [phone]

## 2019-11-18 DIAGNOSIS — F3189 Other bipolar disorder: Secondary | ICD-10-CM | POA: Diagnosis not present

## 2019-11-18 MED ORDER — HYDROXYZINE HCL 25 MG PO TABS
25.0000 mg | ORAL_TABLET | Freq: Three times a day (TID) | ORAL | Status: DC | PRN
  Filled 2019-11-18: qty 1

## 2019-11-18 MED ORDER — RISPERIDONE 1 MG PO TABS: 1.0000 mg | ORAL_TABLET | Freq: Two times a day (BID) | ORAL | 0 refills | Status: DC
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
## 2019-11-18 NOTE — BH Assessment (Signed)
[place] Assessment Progress Note  Per Myles Lipps, MD, this pt does not require psychiatric hospitalization at this time.  Pt is to be discharged from the [facility] Observation Unit with outpatient referrals.  Discharge instructions include referral information for providers in [facility], pt's community of residence.  Pt's nurse, Benjamine Mola, has been notified.  Jalene Mullet, [place] Triage Specialist [phone]

## 2019-11-18 NOTE — Progress Notes (Signed)
Pt discharged to lobby. Pt was stable and appreciative at that time. All papers and prescriptions were given and valuables returned. Verbal understanding expressed. Denies SI/HI and A/VH. Pt given opportunity to express concerns and ask questions.  

## 2019-11-18 NOTE — Discharge Instructions (Signed)
For your behavioral health needs, you are advised to follow up with an outpatient provider.  Contact one of the providers listed below at your earliest opportunity to schedule an intake appointment:       [facility] [place], [place] [address]      [phone]   [facility]  [address] [place], [place] [address]  [phone]

## 2019-11-18 NOTE — Progress Notes (Signed)
CSW assisting with transportation to facilitate a safe discharge. Patient agreeable to use [employer], he marked waiver form with "X's." No additional questions or concerns.  Lyft ordered for 12:00pm. Patient's nurse informed.  Stephanie Acre, LCSW-A Clinical Social Worker

## 2020-03-03 ENCOUNTER — Emergency Department (HOSPITAL_COMMUNITY)
Admission: EM | Admit: 2020-03-03 | Discharge: 2020-03-04 | Disposition: A | Discharge status: Home / Self Care | Payer: Medicare Other | Attending: Emergency Medicine | Admitting: Emergency Medicine

## 2020-03-03 DIAGNOSIS — G40909 Epilepsy, unspecified, not intractable, without status epilepticus: Secondary | ICD-10-CM | POA: Diagnosis present

## 2020-03-03 DIAGNOSIS — Z9861 Coronary angioplasty status: Secondary | ICD-10-CM | POA: Insufficient documentation

## 2020-03-03 DIAGNOSIS — Z79899 Other long term (current) drug therapy: Secondary | ICD-10-CM | POA: Insufficient documentation

## 2020-03-03 DIAGNOSIS — F17210 Nicotine dependence, cigarettes, uncomplicated: Secondary | ICD-10-CM | POA: Insufficient documentation

## 2020-03-03 DIAGNOSIS — I119 Hypertensive heart disease without heart failure: Secondary | ICD-10-CM | POA: Diagnosis not present

## 2020-03-03 DIAGNOSIS — I2510 Atherosclerotic heart disease of native coronary artery without angina pectoris: Secondary | ICD-10-CM | POA: Diagnosis not present

## 2020-03-03 NOTE — ED Triage Notes (Signed)
Pt is from home via [employer], had a brief witnessed seizure tonight.  Pt awake and alert at this time.

## 2020-03-04 ENCOUNTER — Emergency Department (HOSPITAL_COMMUNITY): Payer: Medicare Other

## 2020-03-04 ENCOUNTER — Encounter (HOSPITAL_COMMUNITY): Payer: Self-pay

## 2020-03-04 ENCOUNTER — Other Ambulatory Visit: Payer: Self-pay

## 2020-03-04 LAB — COMPREHENSIVE METABOLIC PANEL
ALT: 28 U/L (ref 0–44)
AST: 17 U/L (ref 15–41)
Albumin: 3.7 g/dL (ref 3.5–5.0)
Alkaline Phosphatase: 215 U/L — ABNORMAL HIGH (ref 38–126)
Anion gap: 7 (ref 5–15)
BUN: 23 mg/dL — ABNORMAL HIGH (ref 6–20)
CO2: 25 mmol/L (ref 22–32)
Calcium: 8.6 mg/dL — ABNORMAL LOW (ref 8.9–10.3)
Chloride: 100 mmol/L (ref 98–111)
Creatinine, Ser: 1.12 mg/dL (ref 0.61–1.24)
GFR calc Af Amer: >60 mL/min (ref >60)
GFR calc non Af Amer: >60 mL/min (ref >60)
Glucose, Bld: 92 mg/dL (ref 70–99)
Potassium: 4.2 mmol/L (ref 3.5–5.1)
Sodium: 132 mmol/L — ABNORMAL LOW (ref 135–145)
Total Bilirubin: 0.3 mg/dL (ref 0.3–1.2)
Total Protein: 6.7 g/dL (ref 6.5–8.1)

## 2020-03-04 LAB — CBC WITH DIFFERENTIAL/PLATELET
Abs Immature Granulocytes: 0.11 10*3/uL — ABNORMAL HIGH (ref 0.00–0.07)
Basophils Absolute: 0.1 10*3/uL (ref 0.0–0.1)
Basophils Relative: 1 %
Eosinophils Absolute: 0.9 10*3/uL — ABNORMAL HIGH (ref 0.0–0.5)
Eosinophils Relative: 8 %
HCT: 35.7 % — ABNORMAL LOW (ref 39.0–52.0)
Hemoglobin: 11.5 g/dL — ABNORMAL LOW (ref 13.0–17.0)
Immature Granulocytes: 1 %
Lymphocytes Relative: 23 %
Lymphs Abs: 2.4 10*3/uL (ref 0.7–4.0)
MCH: 28.9 pg (ref 26.0–34.0)
MCHC: 32.2 g/dL (ref 30.0–36.0)
MCV: 89.7 fL (ref 80.0–100.0)
Monocytes Absolute: 1.1 10*3/uL — ABNORMAL HIGH (ref 0.1–1.0)
Monocytes Relative: 10 %
Neutro Abs: 6 10*3/uL (ref 1.7–7.7)
Neutrophils Relative %: 57 %
Platelets: 252 10*3/uL (ref 150–400)
RBC: 3.98 MIL/uL — ABNORMAL LOW (ref 4.22–5.81)
RDW: 13.1 % (ref 11.5–15.5)
WBC: 10.5 10*3/uL (ref 4.0–10.5)
nRBC: 0 % (ref 0.0–0.2)

## 2020-03-04 LAB — VALPROIC ACID LEVEL: Valproic Acid Lvl: <50 ug/mL — ABNORMAL LOW (ref 50.0–100.0)

## 2020-03-04 LAB — LACTIC ACID, PLASMA: Lactic Acid, Venous: 0.8 mmol/L (ref 0.5–1.9)

## 2020-03-04 MED ORDER — DIVALPROEX SODIUM 250 MG PO DR TAB
1000.0000 mg | DELAYED_RELEASE_TABLET | Freq: Once | ORAL | Status: AC
  Administered 2020-03-04: 1000 mg via ORAL
  Filled 2020-03-04: qty 4

## 2020-03-04 MED ORDER — DIVALPROEX SODIUM 250 MG PO DR TAB: 1000.0000 mg | DELAYED_RELEASE_TABLET | Freq: Every day | ORAL | Status: DC

## 2020-03-04 MED ORDER — LEVETIRACETAM IN NACL 500 MG/100ML IV SOLN
500.0000 mg | Freq: Once | INTRAVENOUS | Status: AC
  Administered 2020-03-04: 03:00:00 500 mg via INTRAVENOUS
  Filled 2020-03-04 (×2): qty 100

## 2020-03-04 MED ORDER — LEVETIRACETAM 500 MG PO TABS: 500.0000 mg | ORAL_TABLET | Freq: Two times a day (BID) | ORAL | Status: DC

## 2020-03-04 NOTE — ED Notes (Signed)
Spoke w/ daughter and notified her of pt being up for d/c.

## 2020-03-04 NOTE — ED Notes (Signed)
Patient taken to CT scan.

## 2020-03-04 NOTE — ED Provider Notes (Signed)
[facility] EMERGENCY DEPARTMENT Provider Note   CSN: [id number] Arrival date & time: 03/03/20  2354   Time seen 12:40 AM  History Chief Complaint  Patient presents with  . Seizures    Douglas Fowler is a 54 y.o. male.  HPI   Patient has a history of seizure disorder, he states his last seizure was last year.  He states he feels fine.  He presents via EMS for a brief witnessed seizure at home tonight.  Patient states he feels fine.  He denies any injury from his seizure tonight.  He states he is taking his medications.  He denies have a neurologist and states his primary care doctor writes his medications.  PCP Imagene Riches, NP   Past Medical History:  Diagnosis Date  . CAD (coronary artery disease)    s/p PCI with stents x 3  . Chronic alcohol abuse   . 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 Hypertension   . Seizures ([place])   . Tobacco abuse     Patient Active Problem List   Diagnosis Date Noted  . MDD (major depressive disorder) 11/17/2019  . Acute lower UTI 03/02/2019  . NSTEMI (non-ST elevated myocardial infarction) ([place]) 03/02/2019  . Elevated troponin 03/01/2019  . Chronic alcohol abuse 03/01/2019  . Acute encephalopathy 02/28/2019  . Alcohol withdrawal delirium ([place]) 12/11/2018  . Alcohol withdrawal ([place]) 12/11/2018  . Alcohol withdrawal seizure with complication, with unspecified complication ([place]) [id number]  . Essential hypertension 08/05/2018  . CAD (coronary artery disease) 08/05/2018  . Tobacco abuse 08/05/2018  . Hypokalemia   . AKI (acute kidney injury) ([place])   . Acute blood loss anemia   . CVA (cerebral vascular accident) ([place]) 08/04/2018    Past Surgical History:  Procedure Laterality Date  . CORONARY ANGIOPLASTY WITH STENT PLACEMENT     stents x 3       Family History  Family history unknown: Yes    Social History   Tobacco Use  . Smoking status: Current Every Day Smoker    Packs/day: 1.00    Years: 5.00    Pack  years: 5.00  . Smokeless tobacco: Never Used  Substance Use Topics  . Alcohol use: Yes    Comment: 1 gallon of whisky Q72 hours  . Drug use: Not Currently  lives at home  Home Medications Prior to Admission medications   Medication Sig Start Date End Date Taking? Authorizing Provider  amLODipine (NORVASC) 10 MG tablet Take 1 tablet (10 mg total) by mouth daily. 12/18/18  Yes Sheikh, Omair Latif, DO  aspirin EC 81 MG EC tablet Take 1 tablet (81 mg total) by mouth daily. 03/05/19  Yes Eulogio Bear U, DO  atorvastatin (LIPITOR) 80 MG tablet Take 1 tablet (80 mg total) by mouth daily at 6 PM. 12/18/18  Yes Sheikh, Omair Latif, DO  carvedilol (COREG) 6.25 MG tablet Take 1 tablet (6.25 mg total) by mouth 2 (two) times daily with a meal. 12/18/18  Yes Sheikh, Omair Latif, DO  clopidogrel (PLAVIX) 75 MG tablet Take 1 tablet (75 mg total) by mouth daily. 03/05/19  Yes Eulogio Bear U, DO  hydrALAZINE (APRESOLINE) 25 MG tablet Take 1 tablet (25 mg total) by mouth every 8 (eight) hours. Patient taking differently: Take 25 mg by mouth 2 (two) times daily.  03/05/19  Yes Vann, Jessica U, DO  levETIRAcetam (KEPPRA) 500 MG tablet Take 1 tablet (500 mg total) by mouth 2 (two) times daily.  12/18/18  Yes Sheikh, Omair Latif, DO  risperiDONE (RISPERDAL) 1 MG tablet Take 1 tablet (1 mg total) by mouth 2 (two) times daily. 11/18/19  Yes Sharma Covert, MD  traZODone (DESYREL) 50 MG tablet Take 1 tablet (50 mg total) by mouth at bedtime. 03/05/19  Yes Geradine Girt, DO  buPROPion (WELLBUTRIN SR) 150 MG 12 hr tablet Take 150 mg by mouth 2 (two) times daily. 10/08/19   [provider]  divalproex (DEPAKOTE) 500 MG DR tablet Take 1,000 mg by mouth at bedtime. 11/07/19   [provider]  folic acid (FOLVITE) 1 MG tablet Take 1 tablet (1 mg total) by mouth daily. 12/19/18   Raiford Noble Latif, DO  multivitamin (PROSIGHT) TABS tablet Take 1 tablet by mouth daily. Patient not taking: Reported on 02/28/2019  12/19/18   Raiford Noble Latif, DO  nicotine (NICODERM CQ - DOSED IN MG/24 HOURS) 21 mg/24hr patch Place 1 patch (21 mg total) onto the skin daily. Patient not taking: Reported on 02/28/2019 12/18/18   Raiford Noble Latif, DO  thiamine 100 MG tablet Take 1 tablet (100 mg total) by mouth daily. Patient not taking: Reported on 02/28/2019 12/19/18   Kerney Elbe, DO    Allergies    Patient has no known allergies.  Review of Systems   Review of Systems  All other systems reviewed and are negative.   Physical Exam Updated Vital Signs BP 107/90   Pulse 60   Resp (!) 9   Ht 5\' 6"  (1.676 m)   Wt 83.9 kg   SpO2 98%   BMI 29.86 kg/m   Physical Exam Vitals and nursing note reviewed.  Constitutional:      Appearance: Normal appearance. He is obese.  HENT:     Head: Normocephalic.     Right Ear: External ear normal.     Left Ear: External ear normal.     Nose: Nose normal.     Mouth/Throat:     Mouth: Mucous membranes are moist.     Pharynx: No oropharyngeal exudate or posterior oropharyngeal erythema.     Comments: Patient has some bruising of his left lower lip consistent with history of seizure. Eyes:     Extraocular Movements: Extraocular movements intact.     Conjunctiva/sclera: Conjunctivae normal.     Pupils: Pupils are equal, round, and reactive to light.  Cardiovascular:     Rate and Rhythm: Normal rate and regular rhythm.     Pulses: Normal pulses.  Pulmonary:     Effort: Pulmonary effort is normal. No respiratory distress.     Breath sounds: Normal breath sounds.  Musculoskeletal:        General: Normal range of motion.     Cervical back: Normal range of motion.  Skin:    General: Skin is warm and dry.  Neurological:     General: No focal deficit present.     Mental Status: He is alert.     Cranial Nerves: No cranial nerve deficit.  Psychiatric:        Mood and Affect: Mood normal.        Behavior: Behavior normal.        Thought Content: Thought content  normal.     ED Results / Procedures / Treatments   Labs (all labs ordered are listed, but only abnormal results are displayed) Results for orders placed or performed during the hospital encounter of 03/03/20  Comprehensive metabolic panel  Result Value Ref Range  Sodium 132 (L) 135 - 145 mmol/L   Potassium 4.2 3.5 - 5.1 mmol/L   Chloride 100 98 - 111 mmol/L   CO2 25 22 - 32 mmol/L   Glucose, Bld 92 70 - 99 mg/dL   BUN 23 (H) 6 - 20 mg/dL   Creatinine, Ser 1.12 0.61 - 1.24 mg/dL   Calcium 8.6 (L) 8.9 - 10.3 mg/dL   Total Protein 6.7 6.5 - 8.1 g/dL   Albumin 3.7 3.5 - 5.0 g/dL   AST 17 15 - 41 U/L   ALT 28 0 - 44 U/L   Alkaline Phosphatase 215 (H) 38 - 126 U/L   Total Bilirubin 0.3 0.3 - 1.2 mg/dL   GFR calc non Af Amer >60 >60 mL/min   GFR calc Af Amer >60 >60 mL/min   Anion gap 7 5 - 15  CBC with Differential  Result Value Ref Range   WBC 10.5 4.0 - 10.5 K/uL   RBC 3.98 (L) 4.22 - 5.81 MIL/uL   Hemoglobin 11.5 (L) 13.0 - 17.0 g/dL   HCT 35.7 (L) 39.0 - 52.0 %   MCV 89.7 80.0 - 100.0 fL   MCH 28.9 26.0 - 34.0 pg   MCHC 32.2 30.0 - 36.0 g/dL   RDW 13.1 11.5 - 15.5 %   Platelets 252 150 - 400 K/uL   nRBC 0.0 0.0 - 0.2 %   Neutrophils Relative % 57 %   Neutro Abs 6.0 1.7 - 7.7 K/uL   Lymphocytes Relative 23 %   Lymphs Abs 2.4 0.7 - 4.0 K/uL   Monocytes Relative 10 %   Monocytes Absolute 1.1 (H) 0.1 - 1.0 K/uL   Eosinophils Relative 8 %   Eosinophils Absolute 0.9 (H) 0.0 - 0.5 K/uL   Basophils Relative 1 %   Basophils Absolute 0.1 0.0 - 0.1 K/uL   Immature Granulocytes 1 %   Abs Immature Granulocytes 0.11 (H) 0.00 - 0.07 K/uL  Valproic acid level  Result Value Ref Range   Valproic Acid Lvl <50 (L) 50.0 - 100.0 ug/mL  Lactic acid, plasma  Result Value Ref Range   Lactic Acid, Venous 0.8 0.5 - 1.9 mmol/L   Laboratory interpretation all normal except subtherapeutic valproic acid level, mild hyponatremia, mild anemia    EKG EKG  Interpretation  Date/Time:  Saturday March 04 2020 00:05:56 EDT Ventricular Rate:  56 PR Interval:    QRS Duration: 100 QT Interval:  484 QTC Calculation: 468 R Axis:   9 Text Interpretation: Sinus rhythm Ventricular premature complex Otherwise within normal limits No significant change since last tracing 16 Nov 2019 Confirmed by Rolland Porter [phone]) on 03/04/2020 12:21:39 AM   Radiology CT Head Wo Contrast  Result Date: 03/04/2020 CLINICAL DATA:  History of multiple strokes and seizure disorder. Seizure tonight. EXAM: CT HEAD WITHOUT CONTRAST TECHNIQUE: Contiguous axial images were obtained from the base of the skull through the vertex without intravenous contrast. COMPARISON:  Head CTs dated 02/28/2019 and 05/05/2019. FINDINGS: Brain: Ventricles are stable in size and configuration. Stable chronic encephalomalacia within the RIGHT MCA and PCA distributions. Additional old infarcts scattered within the LEFT cerebral hemisphere, stable. No mass, hemorrhage, edema or other evidence of acute parenchymal abnormality. No extra-axial hemorrhage. Vascular: Chronic calcified atherosclerotic changes of the large vessels at the skull base. No unexpected hyperdense vessel. Skull: Normal. Negative for fracture or focal lesion. Sinuses/Orbits: No acute finding. Other: None. IMPRESSION: 1. No acute findings. No intracranial mass, hemorrhage or edema. 2. Chronic ischemic changes, as  detailed above. Electronically Signed   By: Franki Cabot M.D.   On: 03/04/2020 04:56    Procedures Procedures (including critical care time)  Medications Ordered in ED Medications  divalproex (DEPAKOTE) DR tablet 1,000 mg (1,000 mg Oral Given 03/04/20 0244)  levETIRAcetam (KEPPRA) IVPB 500 mg/100 mL premix (0 mg Intravenous Stopped 03/04/20 0352)    ED Course  I have reviewed the triage vital signs and the nursing notes.  Pertinent labs & imaging results that were available during my care of the patient were reviewed by me and  considered in my medical decision making (see chart for details).    MDM Rules/Calculators/A&P                      Review of his chart shows he was admitted to behavioral health on December 16 and discharged on December 17.  Per that chart patient has a history of bipolar disorder, alcohol use disorder marijuana use disorder tobacco use disorder history of cocaine use disorder benzodiazepine use disorder and generalized anxiety disorder with his prior admission before that and October 08, 2019.  In December he presented to [facility] ED with homicidal ideation and been combative and threatening to his wife.  At that time they recommended to his wife that he might need to be in some type of monitored setting to make sure he takes his medication.  Wife called nursing staff requesting patient get a head CT tonight.  He had 2 head CTs done in 2020, one in January and one in March and a MRI of the brain in March 2020.  The MRI showed new multifocal areas of acute nonhemorrhagic infarction involving multiple vascular territories compatible suggesting a central embolic source, remote encephalomalacia of the right MCA and PCA territories and remote lacunar infarcts of the basal ganglia.   Care everywhere neurology note from [facility] December 21, 2019 Cerebrovascular accident (CVA), unspecified mechanism ([place]) - Ambulatory Referral To Neurology - ZIO PATCH MONITOR 3 TO 14 DAYS 14 days; [facility]    Patient Instructions  -please call with updated medication list so we can decide what seizure drug to use or to continue. Call and ask to talk to [facility] to updated the medications -continue plavix 75mg  daily and atorvastatin -heart monitor to at least evaluate for atrial fibrillation, can discuss need for anticoagulation if you have it.  -please quit smoking -keep blood pressure and blood sugars under best control possible with primary care -continue Keppra 500mg  twice daily  for seizure and Depakote 1000mg  daily for mood, but please call as above to let us know what the doses are, if not on the [place] we not restart it because of possible alcohol withdraw causing the seizure. If taking it now, continue until next visit and we will discuss stopping at that time. Keppra can worsen agitation, so if this continues let me know and we can stop it sooner.  -memory problems are likely from the stroke, will monitor over time.  -follow up in 6 months, sooner if needed.   Follow-up: Return in about 6 months (around 06/19/2020).   Risks, benefits, and alternatives of the medications and treatment plan prescribed today were discussed, and patient expressed understanding. Plan follow-up as discussed or as needed if any worsening symptoms or change in condition.   Zollie Scale, MD 12/21/2019, 1:07 PM    It also appears patient had a cardiology appointment on April 1 at [facility] however  there is no documentation as to what happened.  Patient states he was unaware of that appointment, he will follow through.  Please note patient had no agitation or aggressive behavior during his ED visit.  He has been totally cooperative and pleasant. Patient even thanked me after we discussed all his test results and that he would be going home.   Final Clinical Impression(s) / ED Diagnoses Final diagnoses:  Seizure disorder [facility])    Rx / DC Orders ED Discharge Orders    None     Plan discharge  Rolland Porter, MD, Barbette Or, MD 03/04/20 [phone]

## 2020-03-04 NOTE — Discharge Instructions (Addendum)
Please contact your neurologist, Dr Erik Obey, to let them know you had another small seizure and came to the ED.  When I review his note from January he said that if you have a lot of agitation they may want to stop the Keppra.  You will need to discuss that with him.  Continue your Depakote, your level tonight was below therapeutic.

## 2020-03-04 NOTE — ED Notes (Signed)
Patient's daughter called and stated that patient had been acting abnormal at home over the last few weeks. According to the daughter [facility] diagnosed patient with dementia and daughter states patient has been confused, acting altered and has had periods of aggression towards his family. Family stated that they have been trying to get patient to see a psychiatrist but patient refuses to do so. Family also states that they would like a head CT preformed on the patient. Family advised by RN that diagnostic tests such as CT scan are at the doctors descression and that the attending physician will evaluate based on the reason for ED visit. Family states that patient did take and has been taking all his medications as prescribed.

## 2020-03-04 NOTE — ED Notes (Signed)
Ph call to daughter, Loma Sousa, no answer  Ph call to wife, vm left

## 2020-03-09 LAB — LEVETIRACETAM LEVEL: Levetiracetam Lvl: 24.1 ug/mL (ref 10.0–40.0)

## 2020-04-03 DIAGNOSIS — I2510 Atherosclerotic heart disease of native coronary artery without angina pectoris: Secondary | ICD-10-CM | POA: Diagnosis not present

## 2020-04-03 DIAGNOSIS — I69398 Other sequelae of cerebral infarction: Secondary | ICD-10-CM | POA: Diagnosis not present

## 2020-04-03 DIAGNOSIS — Z9181 History of falling: Secondary | ICD-10-CM | POA: Diagnosis not present

## 2020-04-03 DIAGNOSIS — I252 Old myocardial infarction: Secondary | ICD-10-CM | POA: Diagnosis not present

## 2020-04-03 DIAGNOSIS — G43909 Migraine, unspecified, not intractable, without status migrainosus: Secondary | ICD-10-CM | POA: Diagnosis not present

## 2020-04-03 DIAGNOSIS — H9190 Unspecified hearing loss, unspecified ear: Secondary | ICD-10-CM | POA: Diagnosis not present

## 2020-04-06 DIAGNOSIS — I69398 Other sequelae of cerebral infarction: Secondary | ICD-10-CM | POA: Diagnosis not present

## 2020-04-06 DIAGNOSIS — G43909 Migraine, unspecified, not intractable, without status migrainosus: Secondary | ICD-10-CM | POA: Diagnosis not present

## 2020-04-06 DIAGNOSIS — Z9181 History of falling: Secondary | ICD-10-CM | POA: Diagnosis not present

## 2020-04-06 DIAGNOSIS — H9190 Unspecified hearing loss, unspecified ear: Secondary | ICD-10-CM | POA: Diagnosis not present

## 2020-04-06 DIAGNOSIS — I252 Old myocardial infarction: Secondary | ICD-10-CM | POA: Diagnosis not present

## 2020-04-06 DIAGNOSIS — I2510 Atherosclerotic heart disease of native coronary artery without angina pectoris: Secondary | ICD-10-CM | POA: Diagnosis not present

## 2020-04-07 DIAGNOSIS — I6340 Cerebral infarction due to embolism of unspecified cerebral artery: Secondary | ICD-10-CM | POA: Diagnosis not present

## 2020-04-07 DIAGNOSIS — H548 Legal blindness, as defined in USA: Secondary | ICD-10-CM | POA: Diagnosis not present

## 2020-04-07 DIAGNOSIS — I10 Essential (primary) hypertension: Secondary | ICD-10-CM | POA: Diagnosis not present

## 2020-04-07 DIAGNOSIS — I2510 Atherosclerotic heart disease of native coronary artery without angina pectoris: Secondary | ICD-10-CM | POA: Diagnosis not present

## 2020-04-10 DIAGNOSIS — Z9181 History of falling: Secondary | ICD-10-CM | POA: Diagnosis not present

## 2020-04-10 DIAGNOSIS — G43909 Migraine, unspecified, not intractable, without status migrainosus: Secondary | ICD-10-CM | POA: Diagnosis not present

## 2020-04-10 DIAGNOSIS — I69398 Other sequelae of cerebral infarction: Secondary | ICD-10-CM | POA: Diagnosis not present

## 2020-04-10 DIAGNOSIS — I2510 Atherosclerotic heart disease of native coronary artery without angina pectoris: Secondary | ICD-10-CM | POA: Diagnosis not present

## 2020-04-10 DIAGNOSIS — I252 Old myocardial infarction: Secondary | ICD-10-CM | POA: Diagnosis not present

## 2020-04-10 DIAGNOSIS — H9190 Unspecified hearing loss, unspecified ear: Secondary | ICD-10-CM | POA: Diagnosis not present

## 2020-04-11 DIAGNOSIS — H9190 Unspecified hearing loss, unspecified ear: Secondary | ICD-10-CM | POA: Diagnosis not present

## 2020-04-11 DIAGNOSIS — G43909 Migraine, unspecified, not intractable, without status migrainosus: Secondary | ICD-10-CM | POA: Diagnosis not present

## 2020-04-11 DIAGNOSIS — I252 Old myocardial infarction: Secondary | ICD-10-CM | POA: Diagnosis not present

## 2020-04-11 DIAGNOSIS — Z9181 History of falling: Secondary | ICD-10-CM | POA: Diagnosis not present

## 2020-04-11 DIAGNOSIS — I2510 Atherosclerotic heart disease of native coronary artery without angina pectoris: Secondary | ICD-10-CM | POA: Diagnosis not present

## 2020-04-11 DIAGNOSIS — I69398 Other sequelae of cerebral infarction: Secondary | ICD-10-CM | POA: Diagnosis not present

## 2020-04-12 DIAGNOSIS — I252 Old myocardial infarction: Secondary | ICD-10-CM | POA: Diagnosis not present

## 2020-04-12 DIAGNOSIS — Z9181 History of falling: Secondary | ICD-10-CM | POA: Diagnosis not present

## 2020-04-12 DIAGNOSIS — I69398 Other sequelae of cerebral infarction: Secondary | ICD-10-CM | POA: Diagnosis not present

## 2020-04-12 DIAGNOSIS — I2510 Atherosclerotic heart disease of native coronary artery without angina pectoris: Secondary | ICD-10-CM | POA: Diagnosis not present

## 2020-04-12 DIAGNOSIS — H9190 Unspecified hearing loss, unspecified ear: Secondary | ICD-10-CM | POA: Diagnosis not present

## 2020-04-12 DIAGNOSIS — G43909 Migraine, unspecified, not intractable, without status migrainosus: Secondary | ICD-10-CM | POA: Diagnosis not present

## 2020-04-13 DIAGNOSIS — Z9181 History of falling: Secondary | ICD-10-CM | POA: Diagnosis not present

## 2020-04-13 DIAGNOSIS — G43909 Migraine, unspecified, not intractable, without status migrainosus: Secondary | ICD-10-CM | POA: Diagnosis not present

## 2020-04-13 DIAGNOSIS — I252 Old myocardial infarction: Secondary | ICD-10-CM | POA: Diagnosis not present

## 2020-04-13 DIAGNOSIS — I2510 Atherosclerotic heart disease of native coronary artery without angina pectoris: Secondary | ICD-10-CM | POA: Diagnosis not present

## 2020-04-13 DIAGNOSIS — I69398 Other sequelae of cerebral infarction: Secondary | ICD-10-CM | POA: Diagnosis not present

## 2020-04-13 DIAGNOSIS — H9190 Unspecified hearing loss, unspecified ear: Secondary | ICD-10-CM | POA: Diagnosis not present

## 2020-04-18 DIAGNOSIS — G43909 Migraine, unspecified, not intractable, without status migrainosus: Secondary | ICD-10-CM | POA: Diagnosis not present

## 2020-04-18 DIAGNOSIS — I252 Old myocardial infarction: Secondary | ICD-10-CM | POA: Diagnosis not present

## 2020-04-18 DIAGNOSIS — H9190 Unspecified hearing loss, unspecified ear: Secondary | ICD-10-CM | POA: Diagnosis not present

## 2020-04-18 DIAGNOSIS — Z9181 History of falling: Secondary | ICD-10-CM | POA: Diagnosis not present

## 2020-04-18 DIAGNOSIS — I2510 Atherosclerotic heart disease of native coronary artery without angina pectoris: Secondary | ICD-10-CM | POA: Diagnosis not present

## 2020-04-18 DIAGNOSIS — I69398 Other sequelae of cerebral infarction: Secondary | ICD-10-CM | POA: Diagnosis not present

## 2020-04-19 DIAGNOSIS — Z9181 History of falling: Secondary | ICD-10-CM | POA: Diagnosis not present

## 2020-04-19 DIAGNOSIS — I252 Old myocardial infarction: Secondary | ICD-10-CM | POA: Diagnosis not present

## 2020-04-19 DIAGNOSIS — I2510 Atherosclerotic heart disease of native coronary artery without angina pectoris: Secondary | ICD-10-CM | POA: Diagnosis not present

## 2020-04-19 DIAGNOSIS — H9190 Unspecified hearing loss, unspecified ear: Secondary | ICD-10-CM | POA: Diagnosis not present

## 2020-04-19 DIAGNOSIS — I69398 Other sequelae of cerebral infarction: Secondary | ICD-10-CM | POA: Diagnosis not present

## 2020-04-19 DIAGNOSIS — G43909 Migraine, unspecified, not intractable, without status migrainosus: Secondary | ICD-10-CM | POA: Diagnosis not present

## 2020-04-22 DIAGNOSIS — I252 Old myocardial infarction: Secondary | ICD-10-CM | POA: Diagnosis not present

## 2020-04-22 DIAGNOSIS — Z9181 History of falling: Secondary | ICD-10-CM | POA: Diagnosis not present

## 2020-04-22 DIAGNOSIS — I69398 Other sequelae of cerebral infarction: Secondary | ICD-10-CM | POA: Diagnosis not present

## 2020-04-22 DIAGNOSIS — I2510 Atherosclerotic heart disease of native coronary artery without angina pectoris: Secondary | ICD-10-CM | POA: Diagnosis not present

## 2020-04-22 DIAGNOSIS — H9190 Unspecified hearing loss, unspecified ear: Secondary | ICD-10-CM | POA: Diagnosis not present

## 2020-04-22 DIAGNOSIS — G43909 Migraine, unspecified, not intractable, without status migrainosus: Secondary | ICD-10-CM | POA: Diagnosis not present

## 2020-05-08 DIAGNOSIS — R350 Frequency of micturition: Secondary | ICD-10-CM | POA: Diagnosis not present

## 2020-05-08 DIAGNOSIS — I10 Essential (primary) hypertension: Secondary | ICD-10-CM | POA: Diagnosis not present

## 2020-05-09 DIAGNOSIS — M6281 Muscle weakness (generalized): Secondary | ICD-10-CM | POA: Diagnosis not present

## 2020-05-11 ENCOUNTER — Encounter (HOSPITAL_COMMUNITY): Payer: Self-pay | Admitting: Emergency Medicine

## 2020-05-11 ENCOUNTER — Inpatient Hospital Stay (HOSPITAL_COMMUNITY)
Admission: AD | Admit: 2020-05-11 | Discharge: 2020-05-14 | DRG: 092 | Disposition: A | Discharge status: Home Health | Payer: Medicare Other | Attending: Internal Medicine | Admitting: Internal Medicine

## 2020-05-11 ENCOUNTER — Emergency Department (HOSPITAL_COMMUNITY): Payer: Medicare Other

## 2020-05-11 DIAGNOSIS — I10 Essential (primary) hypertension: Secondary | ICD-10-CM | POA: Diagnosis present

## 2020-05-11 DIAGNOSIS — R569 Unspecified convulsions: Secondary | ICD-10-CM | POA: Diagnosis present

## 2020-05-11 DIAGNOSIS — I69354 Hemiplegia and hemiparesis following cerebral infarction affecting left non-dominant side: Secondary | ICD-10-CM

## 2020-05-11 DIAGNOSIS — Z79899 Other long term (current) drug therapy: Secondary | ICD-10-CM

## 2020-05-11 DIAGNOSIS — F17210 Nicotine dependence, cigarettes, uncomplicated: Secondary | ICD-10-CM | POA: Diagnosis present

## 2020-05-11 DIAGNOSIS — R270 Ataxia, unspecified: Secondary | ICD-10-CM | POA: Diagnosis not present

## 2020-05-11 DIAGNOSIS — I2510 Atherosclerotic heart disease of native coronary artery without angina pectoris: Secondary | ICD-10-CM | POA: Diagnosis present

## 2020-05-11 DIAGNOSIS — F1011 Alcohol abuse, in remission: Secondary | ICD-10-CM | POA: Diagnosis present

## 2020-05-11 DIAGNOSIS — J939 Pneumothorax, unspecified: Secondary | ICD-10-CM | POA: Diagnosis not present

## 2020-05-11 DIAGNOSIS — Z7982 Long term (current) use of aspirin: Secondary | ICD-10-CM | POA: Diagnosis not present

## 2020-05-11 DIAGNOSIS — Z7902 Long term (current) use of antithrombotics/antiplatelets: Secondary | ICD-10-CM | POA: Diagnosis not present

## 2020-05-11 DIAGNOSIS — I69398 Other sequelae of cerebral infarction: Secondary | ICD-10-CM

## 2020-05-11 DIAGNOSIS — R42 Dizziness and giddiness: Secondary | ICD-10-CM | POA: Diagnosis not present

## 2020-05-11 DIAGNOSIS — G459 Transient cerebral ischemic attack, unspecified: Secondary | ICD-10-CM | POA: Diagnosis not present

## 2020-05-11 DIAGNOSIS — H5440 Blindness, one eye, unspecified eye: Secondary | ICD-10-CM | POA: Diagnosis not present

## 2020-05-11 DIAGNOSIS — D649 Anemia, unspecified: Secondary | ICD-10-CM | POA: Diagnosis present

## 2020-05-11 DIAGNOSIS — G9340 Encephalopathy, unspecified: Secondary | ICD-10-CM | POA: Diagnosis not present

## 2020-05-11 DIAGNOSIS — Z20822 Contact with and (suspected) exposure to covid-19: Secondary | ICD-10-CM | POA: Diagnosis not present

## 2020-05-11 DIAGNOSIS — R296 Repeated falls: Secondary | ICD-10-CM | POA: Diagnosis not present

## 2020-05-11 DIAGNOSIS — G9389 Other specified disorders of brain: Secondary | ICD-10-CM | POA: Diagnosis present

## 2020-05-11 DIAGNOSIS — Z955 Presence of coronary angioplasty implant and graft: Secondary | ICD-10-CM | POA: Diagnosis not present

## 2020-05-11 DIAGNOSIS — G901 Familial dysautonomia [Riley-Day]: Principal | ICD-10-CM

## 2020-05-11 DIAGNOSIS — I129 Hypertensive chronic kidney disease with stage 1 through stage 4 chronic kidney disease, or unspecified chronic kidney disease: Secondary | ICD-10-CM | POA: Diagnosis not present

## 2020-05-11 DIAGNOSIS — M25551 Pain in right hip: Secondary | ICD-10-CM | POA: Diagnosis not present

## 2020-05-11 DIAGNOSIS — F319 Bipolar disorder, unspecified: Secondary | ICD-10-CM | POA: Diagnosis present

## 2020-05-11 DIAGNOSIS — I6300 Cerebral infarction due to thrombosis of unspecified precerebral artery: Secondary | ICD-10-CM | POA: Diagnosis not present

## 2020-05-11 DIAGNOSIS — N182 Chronic kidney disease, stage 2 (mild): Secondary | ICD-10-CM | POA: Diagnosis present

## 2020-05-11 DIAGNOSIS — W19XXXA Unspecified fall, initial encounter: Secondary | ICD-10-CM

## 2020-05-11 DIAGNOSIS — R404 Transient alteration of awareness: Secondary | ICD-10-CM | POA: Diagnosis not present

## 2020-05-11 DIAGNOSIS — S3993XA Unspecified injury of pelvis, initial encounter: Secondary | ICD-10-CM | POA: Diagnosis not present

## 2020-05-11 DIAGNOSIS — Z23 Encounter for immunization: Secondary | ICD-10-CM

## 2020-05-11 DIAGNOSIS — R531 Weakness: Secondary | ICD-10-CM | POA: Diagnosis not present

## 2020-05-11 DIAGNOSIS — G9349 Other encephalopathy: Secondary | ICD-10-CM | POA: Diagnosis not present

## 2020-05-11 DIAGNOSIS — I639 Cerebral infarction, unspecified: Secondary | ICD-10-CM | POA: Diagnosis present

## 2020-05-11 DIAGNOSIS — R519 Headache, unspecified: Secondary | ICD-10-CM | POA: Diagnosis not present

## 2020-05-11 DIAGNOSIS — R400 Somnolence: Secondary | ICD-10-CM | POA: Diagnosis not present

## 2020-05-11 LAB — URINALYSIS, ROUTINE W REFLEX MICROSCOPIC
Bilirubin Urine: NEGATIVE
Glucose, UA: NEGATIVE mg/dL
Hgb urine dipstick: NEGATIVE
Ketones, ur: 5 mg/dL — AB
Leukocytes,Ua: NEGATIVE
Nitrite: NEGATIVE
Protein, ur: NEGATIVE mg/dL
Specific Gravity, Urine: 1.026 (ref 1.005–1.030)
pH: 5 (ref 5.0–8.0)

## 2020-05-11 LAB — CBC
HCT: 37 % — ABNORMAL LOW (ref 39.0–52.0)
Hemoglobin: 11.9 g/dL — ABNORMAL LOW (ref 13.0–17.0)
MCH: 28.9 pg (ref 26.0–34.0)
MCHC: 32.2 g/dL (ref 30.0–36.0)
MCV: 89.8 fL (ref 80.0–100.0)
Platelets: 257 10*3/uL (ref 150–400)
RBC: 4.12 MIL/uL — ABNORMAL LOW (ref 4.22–5.81)
RDW: 13.2 % (ref 11.5–15.5)
WBC: 9.5 10*3/uL (ref 4.0–10.5)
nRBC: 0 % (ref 0.0–0.2)

## 2020-05-11 LAB — BASIC METABOLIC PANEL
Anion gap: 6 (ref 5–15)
BUN: 23 mg/dL — ABNORMAL HIGH (ref 6–20)
CO2: 28 mmol/L (ref 22–32)
Calcium: 8.7 mg/dL — ABNORMAL LOW (ref 8.9–10.3)
Chloride: 106 mmol/L (ref 98–111)
Creatinine, Ser: 1.53 mg/dL — ABNORMAL HIGH (ref 0.61–1.24)
GFR calc Af Amer: 58 mL/min — ABNORMAL LOW (ref >60)
GFR calc non Af Amer: 50 mL/min — ABNORMAL LOW (ref >60)
Glucose, Bld: 105 mg/dL — ABNORMAL HIGH (ref 70–99)
Potassium: 4.5 mmol/L (ref 3.5–5.1)
Sodium: 140 mmol/L (ref 135–145)

## 2020-05-11 MED ORDER — SODIUM CHLORIDE 0.9 % IV BOLUS
1000.0000 mL | Freq: Once | INTRAVENOUS | Status: AC
  Administered 2020-05-11: 1000 mL via INTRAVENOUS

## 2020-05-11 NOTE — ED Provider Notes (Signed)
[facility] DEPT Provider Note   CSN: [id number] Arrival date & time: 05/11/20  1955     History Chief Complaint  Patient presents with  . Fatigue    Douglas Fowler is a 54 y.o. male.  54 yo M with a chief complaints of generalized fatigue.  This been going on for about 48 hours now.  Family states that typically he is able to ambulate under his own power and is normally coherent about where he goes throughout the house.  Unfortunately it seems he has been more confused and has been going to the wrong rooms and he is required significant assistance to ambulate.  He has fallen 3 times in the past 12 hours.  No known injury from the fall.  They deny recent medication change deny head injury deny cough congestion or fever denies nausea vomiting or diarrhea.  He has had decreased oral intake in the past 48 hours.  The history is provided by the patient.  Illness Severity:  Severe Onset quality:  Sudden Duration:  3 days Timing:  Constant Progression:  Worsening Chronicity:  New Associated symptoms: no abdominal pain, no chest pain, no congestion, no diarrhea, no fever, no headaches, no myalgias, no rash, no shortness of breath and no vomiting        Past Medical History:  Diagnosis Date  . CAD (coronary artery disease)    s/p PCI with stents x 3  . Chronic alcohol abuse   . CVA (cerebral vascular accident) ([place])    multiple  . Hypertension   . Seizures ([place])   . Tobacco abuse     Patient Active Problem List   Diagnosis Date Noted  . MDD (major depressive disorder) 11/17/2019  . Acute lower UTI 03/02/2019  . NSTEMI (non-ST elevated myocardial infarction) ([place]) 03/02/2019  . Elevated troponin 03/01/2019  . Chronic alcohol abuse 03/01/2019  . Acute encephalopathy 02/28/2019  . Alcohol withdrawal delirium ([place]) 12/11/2018  . Alcohol withdrawal ([place]) 12/11/2018  . Alcohol withdrawal seizure with complication, with unspecified complication  ([place]) 40/98/1191  . Essential hypertension 08/05/2018  . CAD (coronary artery disease) 08/05/2018  . Tobacco abuse 08/05/2018  . Hypokalemia   . AKI (acute kidney injury) ([place])   . Acute blood loss anemia   . CVA (cerebral vascular accident) ([place]) 08/04/2018    Past Surgical History:  Procedure Laterality Date  . CORONARY ANGIOPLASTY WITH STENT PLACEMENT     stents x 3       Family History  Family history unknown: Yes    Social History   Tobacco Use  . Smoking status: Current Every Day Smoker    Packs/day: 1.00    Years: 5.00    Pack years: 5.00  . Smokeless tobacco: Never Used  Substance Use Topics  . Alcohol use: Yes    Comment: 1 gallon of whisky Q72 hours  . Drug use: Not Currently    Home Medications Prior to Admission medications   Medication Sig Start Date End Date Taking? Authorizing Provider  amLODipine (NORVASC) 10 MG tablet Take 1 tablet (10 mg total) by mouth daily. 12/18/18   Raiford Noble Latif, DO  aspirin EC 81 MG EC tablet Take 1 tablet (81 mg total) by mouth daily. 03/05/19   Geradine Girt, DO  atorvastatin (LIPITOR) 80 MG tablet Take 1 tablet (80 mg total) by mouth daily at 6 PM. 12/18/18   Raiford Noble Latif, DO  buPROPion [facility] SR) 150 MG 12 hr tablet Take  150 mg by mouth 2 (two) times daily. 10/08/19   [provider]  carvedilol (COREG) 6.25 MG tablet Take 1 tablet (6.25 mg total) by mouth 2 (two) times daily with a meal. 12/18/18   Raiford Noble Latif, DO  clopidogrel (PLAVIX) 75 MG tablet Take 1 tablet (75 mg total) by mouth daily. 03/05/19   Geradine Girt, DO  divalproex (DEPAKOTE) 500 MG DR tablet Take 1,000 mg by mouth at bedtime. 11/07/19   [provider]  folic acid (FOLVITE) 1 MG tablet Take 1 tablet (1 mg total) by mouth daily. 12/19/18   Raiford Noble Latif, DO  hydrALAZINE (APRESOLINE) 25 MG tablet Take 1 tablet (25 mg total) by mouth every 8 (eight) hours. Patient taking differently: Take 25 mg by mouth 2 (two)  times daily.  03/05/19   Geradine Girt, DO  levETIRAcetam (KEPPRA) 500 MG tablet Take 1 tablet (500 mg total) by mouth 2 (two) times daily. 12/18/18   Raiford Noble Latif, DO  multivitamin (PROSIGHT) TABS tablet Take 1 tablet by mouth daily. Patient not taking: Reported on 02/28/2019 12/19/18   Raiford Noble Latif, DO  nicotine (NICODERM CQ - DOSED IN MG/24 HOURS) 21 mg/24hr patch Place 1 patch (21 mg total) onto the skin daily. Patient not taking: Reported on 02/28/2019 12/18/18   Raiford Noble Latif, DO  risperiDONE (RISPERDAL) 1 MG tablet Take 1 tablet (1 mg total) by mouth 2 (two) times daily. 11/18/19   Sharma Covert, MD  thiamine 100 MG tablet Take 1 tablet (100 mg total) by mouth daily. Patient not taking: Reported on 02/28/2019 12/19/18   Raiford Noble Latif, DO  traZODone (DESYREL) 50 MG tablet Take 1 tablet (50 mg total) by mouth at bedtime. 03/05/19   Geradine Girt, DO    Allergies    Patient has no known allergies.  Review of Systems   Review of Systems  Constitutional: Positive for appetite change. Negative for chills and fever.  HENT: Negative for congestion and facial swelling.   Eyes: Negative for discharge and visual disturbance.  Respiratory: Negative for shortness of breath.   Cardiovascular: Negative for chest pain and palpitations.  Gastrointestinal: Negative for abdominal pain, diarrhea and vomiting.  Musculoskeletal: Negative for arthralgias and myalgias.  Skin: Negative for color change and rash.  Neurological: Positive for weakness (generalized). Negative for tremors, syncope and headaches.  Psychiatric/Behavioral: Negative for confusion and dysphoric mood.    Physical Exam Updated Vital Signs BP (!) 155/77   Pulse 62   Temp 98.6 F (37 C) (Oral)   Resp 16   SpO2 96%   Physical Exam Vitals and nursing note reviewed.  Constitutional:      Appearance: He is well-developed.  HENT:     Head: Normocephalic and atraumatic.  Eyes:     Pupils: Pupils are  equal, round, and reactive to light.  Neck:     Vascular: No JVD.  Cardiovascular:     Rate and Rhythm: Normal rate and regular rhythm.     Heart sounds: No murmur heard.  No friction rub. No gallop.   Pulmonary:     Effort: No respiratory distress.     Breath sounds: No wheezing.  Abdominal:     General: There is no distension.     Tenderness: There is no abdominal tenderness. There is no guarding or rebound.  Musculoskeletal:        General: Normal range of motion.     Cervical back: Normal range of motion and  neck supple.  Skin:    Coloration: Skin is not pale.     Findings: No rash.  Neurological:     Mental Status: He is alert and oriented to person, place, and time.     Comments: Rotary nystagmus.  Decreased shoulder shrug on the left.  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  Psychiatric:        Behavior: Behavior normal.     ED Results / Procedures / Treatments   Labs (all labs ordered are listed, but only abnormal results are displayed) Labs Reviewed  BASIC METABOLIC PANEL - Abnormal; Notable for the following components:      Result Value   Glucose, Bld 105 (*)    BUN 23 (*)    Creatinine, Ser 1.53 (*)    Calcium 8.7 (*)    GFR calc non Af Amer 50 (*)    GFR calc Af Amer 58 (*)    All other components within normal limits  CBC - Abnormal; Notable for the following components:   RBC 4.12 (*)    Hemoglobin 11.9 (*)    HCT 37.0 (*)    All other components within normal limits  URINALYSIS, ROUTINE W REFLEX MICROSCOPIC - Abnormal; Notable for the following components:   Color, Urine AMBER (*)    Ketones, ur 5 (*)    All other components within normal limits  RESP PANEL BY RT PCR (RSV, FLU A&B, COVID)  SARS CORONAVIRUS 2 BY RT PCR (HOSPITAL ORDER, [facility])    EKG EKG Interpretation  Date/Time:  Thursday May 11 2020 21:58:56 EDT Ventricular Rate:  62 PR Interval:    QRS Duration: 87 QT  Interval:  453 QTC Calculation: 460 R Axis:     Text Interpretation: Sinus rhythm Low voltage, precordial leads isolated flipped t wave in aVL seen on prior though not most recent Otherwise no significant change Confirmed by Deno Etienne [id number]) on 05/11/2020 10:09:49 PM   Radiology CT Head Wo Contrast  Result Date: 05/11/2020 CLINICAL DATA:  54 year old male with headache and lethargy x2 days. EXAM: CT HEAD WITHOUT CONTRAST TECHNIQUE: Contiguous axial images were obtained from the base of the skull through the vertex without intravenous contrast. COMPARISON:  Head CT 03/04/2020.  Brain MRI 03/01/2019 and earlier. FINDINGS: Brain: Widespread abnormal gray-white matter differentiation in the right hemisphere compatible with multifocal confluent areas of encephalomalacia appears stable since April. Moderate similar multifocal encephalomalacia in the left hemisphere also appears stable. There is some bilateral deep white matter involvement although the deep gray nuclei and brainstem remain relatively spared. Small chronic infarct in the left cerebellum. No midline shift, mass effect, or evidence of intracranial mass lesion. No acute intracranial hemorrhage identified. Stable ventricle size and configuration. Vascular: Extensive Calcified atherosclerosis at the skull base. Skull: No acute osseous abnormality identified. Sinuses/Orbits: Visualized paranasal sinuses and mastoids are stable and well pneumatized. Other: Rightward gaze deviation. Visualized scalp soft tissues are within normal limits. IMPRESSION: 1. No acute intracranial abnormality identified. 2. Stable extensive bilateral hemispheric encephalomalacia since April. Electronically Signed   By: Genevie Ann M.D.   On: 05/11/2020 21:54   DG Chest Port 1 View  Result Date: 05/11/2020 CLINICAL DATA:  Weakness. EXAM: PORTABLE CHEST 1 VIEW COMPARISON:  None. FINDINGS: There is no evidence of acute infiltrate, pleural effusion or pneumothorax. The heart size  and mediastinal contours are within normal limits. The visualized skeletal structures are unremarkable. IMPRESSION: No active disease. Electronically Signed  By: Virgina Norfolk M.D.   On: 05/11/2020 21:54    Procedures Procedures (including critical care time)  Medications Ordered in ED Medications  sodium chloride 0.9 % bolus 1,000 mL (0 mLs Intravenous Stopped 05/11/20 2328)    ED Course  I have reviewed the triage vital signs and the nursing notes.  Pertinent labs & imaging results that were available during my care of the patient were reviewed by me and considered in my medical decision making (see chart for details).    MDM Rules/Calculators/A&P                          54 yo M with a chief complaints of generalized weakness and some confusion.  Going on for about 3 days now.  Been to the point where family is having trouble taking care of him at home.  Will obtain a CT scan of the head lab work chest x-ray bolus of IV fluids reassess.  No obvious causes identified of the patient's weakness on the evaluation done here.  I have reviewed the patient's records and it appears he had a stroke in March of last year.  At that time his presentation was very similar.  Will discuss with neurology.  I discussed the case with Dr. Lorraine Lax, neurology he did felt it would be reasonable to repeat an MRI of the brain.  As the patient has an acute change in his mental status and weakness over the course of the past couple days will discuss with the hospitalist for admission.  The patients results and plan were reviewed and discussed.   Any x-rays performed were independently reviewed by myself.   Differential diagnosis were considered with the presenting HPI.  Medications  sodium chloride 0.9 % bolus 1,000 mL (0 mLs Intravenous Stopped 05/11/20 2328)    Vitals:   05/11/20 2006 05/11/20 2215 05/11/20 2245 05/11/20 2300  BP: 125/83 (!) 191/87 (!) 183/99 (!) 155/77  Pulse: 62 61 62 62  Resp: 18  13 13 16   Temp: 98.6 F (37 C)     TempSrc: Oral     SpO2: 98% 97% 98% 96%    Final diagnoses:  Transient alteration of awareness    Admission/ observation were discussed with the admitting physician, patient and/or family and they are comfortable with the plan.   Final Clinical Impression(s) / ED Diagnoses Final diagnoses:  Transient alteration of awareness    Rx / DC Orders ED Discharge Orders    None       Deno Etienne, DO 05/12/20 0041

## 2020-05-11 NOTE — ED Notes (Signed)
Pt ambulated out into the hallway, stand by assist. Says he uses a cane normally to walk

## 2020-05-11 NOTE — ED Triage Notes (Signed)
Per EMS, patient from home, family reports increased lethargy x2 days. Denies pain. Hx dementia. A&Ox3. Baseline per family.

## 2020-05-11 NOTE — ED Notes (Signed)
Patient aware that we need urine sample for testing, unable at this time. Pt given instruction on providing urine sample when able to do so.   

## 2020-05-12 ENCOUNTER — Encounter (HOSPITAL_COMMUNITY): Payer: Self-pay | Admitting: Internal Medicine

## 2020-05-12 ENCOUNTER — Other Ambulatory Visit: Payer: Self-pay

## 2020-05-12 ENCOUNTER — Observation Stay (HOSPITAL_COMMUNITY): Payer: Medicare Other

## 2020-05-12 DIAGNOSIS — I10 Essential (primary) hypertension: Secondary | ICD-10-CM

## 2020-05-12 DIAGNOSIS — R519 Headache, unspecified: Secondary | ICD-10-CM | POA: Diagnosis not present

## 2020-05-12 DIAGNOSIS — R270 Ataxia, unspecified: Secondary | ICD-10-CM | POA: Diagnosis not present

## 2020-05-12 DIAGNOSIS — R531 Weakness: Secondary | ICD-10-CM | POA: Diagnosis not present

## 2020-05-12 DIAGNOSIS — I6300 Cerebral infarction due to thrombosis of unspecified precerebral artery: Secondary | ICD-10-CM

## 2020-05-12 DIAGNOSIS — G9340 Encephalopathy, unspecified: Secondary | ICD-10-CM

## 2020-05-12 DIAGNOSIS — M25551 Pain in right hip: Secondary | ICD-10-CM | POA: Diagnosis not present

## 2020-05-12 DIAGNOSIS — S3993XA Unspecified injury of pelvis, initial encounter: Secondary | ICD-10-CM | POA: Diagnosis not present

## 2020-05-12 LAB — COMPREHENSIVE METABOLIC PANEL
ALT: 20 U/L (ref 0–44)
AST: 16 U/L (ref 15–41)
Albumin: 3.7 g/dL (ref 3.5–5.0)
Alkaline Phosphatase: 224 U/L — ABNORMAL HIGH (ref 38–126)
Anion gap: 11 (ref 5–15)
BUN: 20 mg/dL (ref 6–20)
CO2: 23 mmol/L (ref 22–32)
Calcium: 8.4 mg/dL — ABNORMAL LOW (ref 8.9–10.3)
Chloride: 105 mmol/L (ref 98–111)
Creatinine, Ser: 1.29 mg/dL — ABNORMAL HIGH (ref 0.61–1.24)
GFR calc Af Amer: >60 mL/min (ref >60)
GFR calc non Af Amer: >60 mL/min (ref >60)
Glucose, Bld: 109 mg/dL — ABNORMAL HIGH (ref 70–99)
Potassium: 4 mmol/L (ref 3.5–5.1)
Sodium: 139 mmol/L (ref 135–145)
Total Bilirubin: 0.5 mg/dL (ref 0.3–1.2)
Total Protein: 6.4 g/dL — ABNORMAL LOW (ref 6.5–8.1)

## 2020-05-12 LAB — CBC WITH DIFFERENTIAL/PLATELET
Abs Immature Granulocytes: 0.05 10*3/uL (ref 0.00–0.07)
Basophils Absolute: 0 10*3/uL (ref 0.0–0.1)
Basophils Relative: 1 %
Eosinophils Absolute: 0.4 10*3/uL (ref 0.0–0.5)
Eosinophils Relative: 5 %
HCT: 36.2 % — ABNORMAL LOW (ref 39.0–52.0)
Hemoglobin: 11.7 g/dL — ABNORMAL LOW (ref 13.0–17.0)
Immature Granulocytes: 1 %
Lymphocytes Relative: 32 %
Lymphs Abs: 2.5 10*3/uL (ref 0.7–4.0)
MCH: 28.7 pg (ref 26.0–34.0)
MCHC: 32.3 g/dL (ref 30.0–36.0)
MCV: 88.9 fL (ref 80.0–100.0)
Monocytes Absolute: 0.8 10*3/uL (ref 0.1–1.0)
Monocytes Relative: 10 %
Neutro Abs: 4 10*3/uL (ref 1.7–7.7)
Neutrophils Relative %: 51 %
Platelets: 235 10*3/uL (ref 150–400)
RBC: 4.07 MIL/uL — ABNORMAL LOW (ref 4.22–5.81)
RDW: 13.2 % (ref 11.5–15.5)
WBC: 7.8 10*3/uL (ref 4.0–10.5)
nRBC: 0 % (ref 0.0–0.2)

## 2020-05-12 LAB — RAPID URINE DRUG SCREEN, HOSP PERFORMED
Amphetamines: NOT DETECTED
Barbiturates: NOT DETECTED
Benzodiazepines: POSITIVE — AB
Cocaine: NOT DETECTED
Opiates: NOT DETECTED
Tetrahydrocannabinol: POSITIVE — AB

## 2020-05-12 LAB — SARS CORONAVIRUS 2 BY RT PCR (HOSPITAL ORDER, PERFORMED IN ~~LOC~~ HOSPITAL LAB): SARS Coronavirus 2: NEGATIVE

## 2020-05-12 LAB — MAGNESIUM: Magnesium: 2.1 mg/dL (ref 1.7–2.4)

## 2020-05-12 LAB — VALPROIC ACID LEVEL: Valproic Acid Lvl: <10 ug/mL — ABNORMAL LOW (ref 50.0–100.0)

## 2020-05-12 LAB — AMMONIA: Ammonia: 26 umol/L (ref 9–35)

## 2020-05-12 LAB — TSH: TSH: 2.03 u[IU]/mL (ref 0.350–4.500)

## 2020-05-12 LAB — HIV ANTIBODY (ROUTINE TESTING W REFLEX): HIV Screen 4th Generation wRfx: NONREACTIVE

## 2020-05-12 MED ORDER — ONDANSETRON HCL 4 MG/2ML IJ SOLN: 4.0000 mg | Freq: Four times a day (QID) | INTRAMUSCULAR | Status: DC | PRN

## 2020-05-12 MED ORDER — BUPROPION HCL ER (SR) 150 MG PO TB12: 150.0000 mg | ORAL_TABLET | Freq: Two times a day (BID) | ORAL | Status: DC

## 2020-05-12 MED ORDER — RISPERIDONE 1 MG PO TABS
1.0000 mg | ORAL_TABLET | Freq: Two times a day (BID) | ORAL | Status: DC
  Administered 2020-05-12 – 2020-05-14 (×5): 1 mg via ORAL
  Filled 2020-05-12 (×5): qty 1

## 2020-05-12 MED ORDER — ENOXAPARIN SODIUM 40 MG/0.4ML ~~LOC~~ SOLN
40.0000 mg | SUBCUTANEOUS | Status: DC
  Administered 2020-05-12 – 2020-05-14 (×3): 40 mg via SUBCUTANEOUS
  Filled 2020-05-12 (×3): qty 0.4

## 2020-05-12 MED ORDER — LEVETIRACETAM 500 MG PO TABS
500.0000 mg | ORAL_TABLET | Freq: Two times a day (BID) | ORAL | Status: DC
  Administered 2020-05-12 – 2020-05-14 (×5): 500 mg via ORAL
  Filled 2020-05-12 (×5): qty 1

## 2020-05-12 MED ORDER — LORAZEPAM 2 MG/ML IJ SOLN: 0.2500 mg | Freq: Once | INTRAMUSCULAR | Status: AC

## 2020-05-12 MED ORDER — ONDANSETRON HCL 4 MG PO TABS: 4.0000 mg | ORAL_TABLET | Freq: Four times a day (QID) | ORAL | Status: DC | PRN

## 2020-05-12 MED ORDER — LORAZEPAM 2 MG/ML IJ SOLN
INTRAMUSCULAR | Status: AC
  Administered 2020-05-12: 0.25 mg via INTRAVENOUS
  Filled 2020-05-12: qty 1

## 2020-05-12 MED ORDER — CARVEDILOL 3.125 MG PO TABS
3.1250 mg | ORAL_TABLET | Freq: Two times a day (BID) | ORAL | Status: DC
  Administered 2020-05-13 – 2020-05-14 (×3): 3.125 mg via ORAL
  Filled 2020-05-12 (×3): qty 1

## 2020-05-12 MED ORDER — TRAZODONE HCL 100 MG PO TABS
50.0000 mg | ORAL_TABLET | Freq: Every day | ORAL | Status: DC
  Administered 2020-05-12 – 2020-05-13 (×2): 50 mg via ORAL
  Filled 2020-05-12 (×2): qty 1

## 2020-05-12 MED ORDER — ATORVASTATIN CALCIUM 40 MG PO TABS
80.0000 mg | ORAL_TABLET | Freq: Every day | ORAL | Status: DC
  Administered 2020-05-12 – 2020-05-13 (×2): 80 mg via ORAL
  Filled 2020-05-12 (×2): qty 2

## 2020-05-12 MED ORDER — FOLIC ACID 1 MG PO TABS
1.0000 mg | ORAL_TABLET | Freq: Every day | ORAL | Status: DC
  Administered 2020-05-12 – 2020-05-14 (×3): 1 mg via ORAL
  Filled 2020-05-12 (×3): qty 1

## 2020-05-12 MED ORDER — ASPIRIN EC 81 MG PO TBEC
81.0000 mg | DELAYED_RELEASE_TABLET | Freq: Every day | ORAL | Status: DC
  Administered 2020-05-12 – 2020-05-14 (×3): 81 mg via ORAL
  Filled 2020-05-12 (×3): qty 1

## 2020-05-12 MED ORDER — CLOPIDOGREL BISULFATE 75 MG PO TABS
75.0000 mg | ORAL_TABLET | Freq: Every day | ORAL | Status: DC
  Administered 2020-05-12 – 2020-05-14 (×3): 75 mg via ORAL
  Filled 2020-05-12 (×3): qty 1

## 2020-05-12 MED ORDER — HYDRALAZINE HCL 20 MG/ML IJ SOLN: 10.0000 mg | INTRAMUSCULAR | Status: DC | PRN

## 2020-05-12 MED ORDER — THIAMINE HCL 100 MG PO TABS
100.0000 mg | ORAL_TABLET | Freq: Every day | ORAL | Status: DC
  Administered 2020-05-12 – 2020-05-14 (×3): 100 mg via ORAL
  Filled 2020-05-12 (×3): qty 1

## 2020-05-12 MED ORDER — SODIUM CHLORIDE 0.9 % IV SOLN
500.0000 mL | Freq: Once | INTRAVENOUS | Status: AC
  Administered 2020-05-12: 500 mL via INTRAVENOUS

## 2020-05-12 MED ORDER — PNEUMOCOCCAL VAC POLYVALENT 25 MCG/0.5ML IJ INJ
0.5000 mL | INJECTION | INTRAMUSCULAR | Status: AC
  Administered 2020-05-13: 0.5 mL via INTRAMUSCULAR
  Filled 2020-05-12: qty 0.5

## 2020-05-12 MED ORDER — CARVEDILOL 6.25 MG PO TABS
6.2500 mg | ORAL_TABLET | Freq: Two times a day (BID) | ORAL | Status: DC
  Administered 2020-05-12: 6.25 mg via ORAL
  Filled 2020-05-12: qty 1

## 2020-05-12 MED ORDER — DIVALPROEX SODIUM 500 MG PO DR TAB: 1000.0000 mg | DELAYED_RELEASE_TABLET | Freq: Every day | ORAL | Status: DC

## 2020-05-12 NOTE — ED Notes (Signed)
Patient transported to MRI 
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
## 2020-05-12 NOTE — Plan of Care (Signed)
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
## 2020-05-12 NOTE — Progress Notes (Signed)
54 yo M presenting with weakness and falls. pMN admission. See H&P for details. MRI brain negative. PT/OT rec HHPT/OT. Orthostatic positive, but hypertensive as well. He is also brady on coreg. Let's get him some ted hose. Get him a little bit of fluids. Repeat orthostatics in AM. Otherwise, continue as per H&P.   Physical Exam: General: 54 y.o. male resting in bed in NAD Cardiovascular: brady, +S1, S2, no m/g/r, equal pulses throughout Respiratory: CTABL, no w/r/r, normal WOB GI: BS+, NDNT, no masses noted, no organomegaly noted MSK: No e/c/c Neuro: A&O x 3, residual left side weakness from previous stroke Psyc: Appropriate interaction and affect, calm/cooperative  Jonnie Finner, DO

## 2020-05-13 DIAGNOSIS — I129 Hypertensive chronic kidney disease with stage 1 through stage 4 chronic kidney disease, or unspecified chronic kidney disease: Secondary | ICD-10-CM | POA: Diagnosis present

## 2020-05-13 DIAGNOSIS — Z23 Encounter for immunization: Secondary | ICD-10-CM | POA: Diagnosis present

## 2020-05-13 DIAGNOSIS — G901 Familial dysautonomia [Riley-Day]: Secondary | ICD-10-CM | POA: Diagnosis present

## 2020-05-13 DIAGNOSIS — R569 Unspecified convulsions: Secondary | ICD-10-CM | POA: Diagnosis present

## 2020-05-13 DIAGNOSIS — H5440 Blindness, one eye, unspecified eye: Secondary | ICD-10-CM | POA: Diagnosis present

## 2020-05-13 DIAGNOSIS — Z20822 Contact with and (suspected) exposure to covid-19: Secondary | ICD-10-CM | POA: Diagnosis present

## 2020-05-13 DIAGNOSIS — N182 Chronic kidney disease, stage 2 (mild): Secondary | ICD-10-CM | POA: Diagnosis present

## 2020-05-13 DIAGNOSIS — I69354 Hemiplegia and hemiparesis following cerebral infarction affecting left non-dominant side: Secondary | ICD-10-CM | POA: Diagnosis not present

## 2020-05-13 DIAGNOSIS — Z7982 Long term (current) use of aspirin: Secondary | ICD-10-CM | POA: Diagnosis not present

## 2020-05-13 DIAGNOSIS — F1011 Alcohol abuse, in remission: Secondary | ICD-10-CM | POA: Diagnosis present

## 2020-05-13 DIAGNOSIS — Z7902 Long term (current) use of antithrombotics/antiplatelets: Secondary | ICD-10-CM | POA: Diagnosis not present

## 2020-05-13 DIAGNOSIS — I2510 Atherosclerotic heart disease of native coronary artery without angina pectoris: Secondary | ICD-10-CM | POA: Diagnosis present

## 2020-05-13 DIAGNOSIS — F319 Bipolar disorder, unspecified: Secondary | ICD-10-CM | POA: Diagnosis present

## 2020-05-13 DIAGNOSIS — G9340 Encephalopathy, unspecified: Secondary | ICD-10-CM | POA: Diagnosis not present

## 2020-05-13 DIAGNOSIS — D649 Anemia, unspecified: Secondary | ICD-10-CM | POA: Diagnosis present

## 2020-05-13 DIAGNOSIS — I6300 Cerebral infarction due to thrombosis of unspecified precerebral artery: Secondary | ICD-10-CM | POA: Diagnosis not present

## 2020-05-13 DIAGNOSIS — Z79899 Other long term (current) drug therapy: Secondary | ICD-10-CM | POA: Diagnosis not present

## 2020-05-13 DIAGNOSIS — Z955 Presence of coronary angioplasty implant and graft: Secondary | ICD-10-CM | POA: Diagnosis not present

## 2020-05-13 DIAGNOSIS — F17210 Nicotine dependence, cigarettes, uncomplicated: Secondary | ICD-10-CM | POA: Diagnosis present

## 2020-05-13 DIAGNOSIS — R404 Transient alteration of awareness: Secondary | ICD-10-CM | POA: Diagnosis present

## 2020-05-13 DIAGNOSIS — I10 Essential (primary) hypertension: Secondary | ICD-10-CM | POA: Diagnosis not present

## 2020-05-13 DIAGNOSIS — R296 Repeated falls: Secondary | ICD-10-CM | POA: Diagnosis present

## 2020-05-13 DIAGNOSIS — G9349 Other encephalopathy: Secondary | ICD-10-CM | POA: Diagnosis present

## 2020-05-13 DIAGNOSIS — I69398 Other sequelae of cerebral infarction: Secondary | ICD-10-CM | POA: Diagnosis not present

## 2020-05-13 MED ORDER — HYDRALAZINE HCL 10 MG PO TABS
10.0000 mg | ORAL_TABLET | Freq: Two times a day (BID) | ORAL | Status: DC
  Administered 2020-05-13 – 2020-05-14 (×3): 10 mg via ORAL
  Filled 2020-05-13 (×3): qty 1

## 2020-05-13 NOTE — Plan of Care (Signed)

## 2020-05-13 NOTE — Plan of Care (Signed)
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
## 2020-05-14 DIAGNOSIS — N182 Chronic kidney disease, stage 2 (mild): Secondary | ICD-10-CM

## 2020-05-14 DIAGNOSIS — R296 Repeated falls: Secondary | ICD-10-CM

## 2020-05-14 DIAGNOSIS — F319 Bipolar disorder, unspecified: Secondary | ICD-10-CM

## 2020-05-14 DIAGNOSIS — R42 Dizziness and giddiness: Secondary | ICD-10-CM

## 2020-05-14 LAB — COMPREHENSIVE METABOLIC PANEL
ALT: 21 U/L (ref 0–44)
AST: 16 U/L (ref 15–41)
Albumin: 3.6 g/dL (ref 3.5–5.0)
Alkaline Phosphatase: 242 U/L — ABNORMAL HIGH (ref 38–126)
Anion gap: 9 (ref 5–15)
BUN: 22 mg/dL — ABNORMAL HIGH (ref 6–20)
CO2: 22 mmol/L (ref 22–32)
Calcium: 8.4 mg/dL — ABNORMAL LOW (ref 8.9–10.3)
Chloride: 107 mmol/L (ref 98–111)
Creatinine, Ser: 1.31 mg/dL — ABNORMAL HIGH (ref 0.61–1.24)
GFR calc Af Amer: >60 mL/min (ref >60)
GFR calc non Af Amer: >60 mL/min (ref >60)
Glucose, Bld: 94 mg/dL (ref 70–99)
Potassium: 3.9 mmol/L (ref 3.5–5.1)
Sodium: 138 mmol/L (ref 135–145)
Total Bilirubin: 0.7 mg/dL (ref 0.3–1.2)
Total Protein: 6.4 g/dL — ABNORMAL LOW (ref 6.5–8.1)

## 2020-05-14 LAB — CBC WITH DIFFERENTIAL/PLATELET
Abs Immature Granulocytes: 0.03 10*3/uL (ref 0.00–0.07)
Basophils Absolute: 0.1 10*3/uL (ref 0.0–0.1)
Basophils Relative: 0 %
Eosinophils Absolute: 0.4 10*3/uL (ref 0.0–0.5)
Eosinophils Relative: 3 %
HCT: 35.4 % — ABNORMAL LOW (ref 39.0–52.0)
Hemoglobin: 11.7 g/dL — ABNORMAL LOW (ref 13.0–17.0)
Immature Granulocytes: 0 %
Lymphocytes Relative: 19 %
Lymphs Abs: 2.1 10*3/uL (ref 0.7–4.0)
MCH: 29 pg (ref 26.0–34.0)
MCHC: 33.1 g/dL (ref 30.0–36.0)
MCV: 87.8 fL (ref 80.0–100.0)
Monocytes Absolute: 1.1 10*3/uL — ABNORMAL HIGH (ref 0.1–1.0)
Monocytes Relative: 10 %
Neutro Abs: 7.7 10*3/uL (ref 1.7–7.7)
Neutrophils Relative %: 68 %
Platelets: 227 10*3/uL (ref 150–400)
RBC: 4.03 MIL/uL — ABNORMAL LOW (ref 4.22–5.81)
RDW: 13 % (ref 11.5–15.5)
WBC: 11.4 10*3/uL — ABNORMAL HIGH (ref 4.0–10.5)
nRBC: 0 % (ref 0.0–0.2)

## 2020-05-14 LAB — VITAMIN B1: Vitamin B1 (Thiamine): 150.1 nmol/L (ref 66.5–200.0)

## 2020-05-14 LAB — MAGNESIUM: Magnesium: 1.9 mg/dL (ref 1.7–2.4)

## 2020-05-14 MED ORDER — CARVEDILOL 3.125 MG PO TABS: 3.1250 mg | ORAL_TABLET | Freq: Two times a day (BID) | ORAL | 0 refills | Status: DC

## 2020-05-14 NOTE — Plan of Care (Signed)
  Problem: Activity: Goal: Risk for activity intolerance will decrease Outcome: Adequate for Discharge   Problem: Nutrition: Goal: Adequate nutrition will be maintained Outcome: Adequate for Discharge   Problem: Safety: Goal: Ability to remain free from injury will improve Outcome: Adequate for Discharge   

## 2020-05-14 NOTE — Progress Notes (Signed)
Discharge instruction given to wife at bedside and updated changes with medicines. Wife verbalized understanding.

## 2020-05-14 NOTE — TOC Initial Note (Signed)
Transition of Care [facility]) - Initial/Assessment Note    Patient Details  Name: Douglas Fowler MRN: [id number] Date of Birth: 1965-12-05  Transition of Care (TOC) CM/SW Contact:    Joaquin Courts, RN Phone Number: 05/14/2020, 11:39 AM  Clinical Narrative:                 Patient set up with [facility] for HHPT/OT.  [employer] to deliver rolling walker to bedside for home use.  Expected Discharge Plan: [place] Barriers to Discharge: No Barriers Identified   Patient Goals and CMS Choice Patient states their goals for this hospitalization and ongoing recovery are:: to go home today CMS Medicare.gov Compare Post Acute Care list provided to:: Patient Choice offered to / list presented to : Patient  Expected Discharge Plan and Services Expected Discharge Plan: [place]   Discharge Planning Services: CM Consult Post Acute Care Choice: [place] arrangements for the past 2 months: Single Family Home                 DME Arranged: Walker rolling DME Agency: [employer] Date DME Agency Contacted: 05/14/20 Time DME Agency Contacted: [phone] Representative spoke with at DME Agency: [place]: PT, OT [place] Agency: [place] ([place]) Date [place]: 05/14/20 Time [place]: [id number]    Prior Living Arrangements/Services Living arrangements for the past 2 months: [place]   Patient language and need for interpreter reviewed:: Yes Do you feel safe going back to the place where you live?: Yes      Need for Family Participation in Patient Care: Yes (Comment) Care giver support system in place?: Yes (comment)   Criminal Activity/Legal Involvement Pertinent to Current Situation/Hospitalization: No - Comment as needed  Activities of Daily Living Home Assistive Devices/Equipment: Cane (specify quad or straight), Shower chair with back, Blood pressure cuff, Walker (specify type) ADL Screening (condition at  time of admission) Patient's cognitive ability adequate to safely complete daily activities?: Yes Is the patient deaf or have difficulty hearing?: Yes Does the patient have difficulty seeing, even when wearing glasses/contacts?: Yes Does the patient have difficulty concentrating, remembering, or making decisions?: Yes Patient able to express need for assistance with ADLs?: Yes Does the patient have difficulty dressing or bathing?: Yes Independently performs ADLs?: No Communication: Independent Dressing (OT): Independent Grooming: Appropriate for developmental age Feeding: Appropriate for developmental age Bathing: Needs assistance Is this a change from baseline?: Pre-admission baseline Toileting: Needs assistance Is this a change from baseline?: Pre-admission baseline In/Out Bed: Needs assistance, Independent with device (comment) Is this a change from baseline?: Pre-admission baseline Walks in Home: Independent with device (comment), Needs assistance Is this a change from baseline?: Pre-admission baseline Does the patient have difficulty walking or climbing stairs?: Yes Weakness of Legs: Left Weakness of Arms/Hands: Left  Permission Sought/Granted                  Emotional Assessment Appearance:: Appears stated age Attitude/Demeanor/Rapport: Engaged Affect (typically observed): Accepting Orientation: : Oriented to Self, Oriented to Place, Oriented to  Time, Oriented to Situation   Psych Involvement: No (comment)  Admission diagnosis:  Transient alteration of awareness [R40.4] Fall [W19.XXXA] Acute encephalopathy [G93.40] Dysautonomia ([place]) [G90.1] Patient Active Problem List   Diagnosis Date Noted  . Dysautonomia ([place]) 05/13/2020  . MDD (major depressive disorder) 11/17/2019  . Acute lower UTI 03/02/2019  . NSTEMI (non-ST elevated myocardial infarction) ([place]) 03/02/2019  . Elevated troponin  03/01/2019  . Chronic alcohol abuse 03/01/2019  . Acute encephalopathy  02/28/2019  . Alcohol withdrawal delirium ([place]) 12/11/2018  . Alcohol withdrawal ([place]) 12/11/2018  . Alcohol withdrawal seizure with complication, with unspecified complication ([place]) [id number]/[id number]  . Essential hypertension 08/05/2018  . CAD (coronary artery disease) 08/05/2018  . Tobacco abuse 08/05/2018  . Hypokalemia   . AKI (acute kidney injury) ([place])   . Acute blood loss anemia   . CVA (cerebral vascular accident) ([place]) 08/04/2018   PCP:  Curly Rim, MD Pharmacy:   [facility] [address] [place] [address] [place] [address] Phone: [phone] Fax: [phone]     Social Determinants of Health (SDOH) Interventions    Readmission Risk Interventions No flowsheet data found.
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
## 2020-05-15 DIAGNOSIS — I951 Orthostatic hypotension: Secondary | ICD-10-CM | POA: Diagnosis not present

## 2020-05-15 DIAGNOSIS — I2510 Atherosclerotic heart disease of native coronary artery without angina pectoris: Secondary | ICD-10-CM | POA: Diagnosis not present

## 2020-05-15 DIAGNOSIS — I10 Essential (primary) hypertension: Secondary | ICD-10-CM | POA: Diagnosis not present

## 2020-05-15 DIAGNOSIS — I6340 Cerebral infarction due to embolism of unspecified cerebral artery: Secondary | ICD-10-CM | POA: Diagnosis not present

## 2020-05-16 DIAGNOSIS — G459 Transient cerebral ischemic attack, unspecified: Secondary | ICD-10-CM | POA: Diagnosis not present

## 2020-05-16 DIAGNOSIS — Z09 Encounter for follow-up examination after completed treatment for conditions other than malignant neoplasm: Secondary | ICD-10-CM | POA: Diagnosis not present

## 2020-05-16 DIAGNOSIS — I951 Orthostatic hypotension: Secondary | ICD-10-CM | POA: Diagnosis not present

## 2020-05-16 DIAGNOSIS — I10 Essential (primary) hypertension: Secondary | ICD-10-CM | POA: Diagnosis not present

## 2020-05-17 DIAGNOSIS — G8194 Hemiplegia, unspecified affecting left nondominant side: Secondary | ICD-10-CM | POA: Diagnosis not present

## 2020-05-17 DIAGNOSIS — I69398 Other sequelae of cerebral infarction: Secondary | ICD-10-CM | POA: Diagnosis not present

## 2020-05-17 DIAGNOSIS — I10 Essential (primary) hypertension: Secondary | ICD-10-CM | POA: Diagnosis not present

## 2020-05-17 DIAGNOSIS — I6340 Cerebral infarction due to embolism of unspecified cerebral artery: Secondary | ICD-10-CM | POA: Diagnosis not present

## 2020-05-19 DIAGNOSIS — I129 Hypertensive chronic kidney disease with stage 1 through stage 4 chronic kidney disease, or unspecified chronic kidney disease: Secondary | ICD-10-CM | POA: Diagnosis not present

## 2020-05-19 DIAGNOSIS — I69354 Hemiplegia and hemiparesis following cerebral infarction affecting left non-dominant side: Secondary | ICD-10-CM | POA: Diagnosis not present

## 2020-05-19 DIAGNOSIS — G901 Familial dysautonomia [Riley-Day]: Secondary | ICD-10-CM | POA: Diagnosis not present

## 2020-05-19 DIAGNOSIS — D500 Iron deficiency anemia secondary to blood loss (chronic): Secondary | ICD-10-CM | POA: Diagnosis not present

## 2020-05-19 DIAGNOSIS — N183 Chronic kidney disease, stage 3 unspecified: Secondary | ICD-10-CM | POA: Diagnosis not present

## 2020-05-19 DIAGNOSIS — H543 Unqualified visual loss, both eyes: Secondary | ICD-10-CM | POA: Diagnosis not present

## 2020-05-19 DIAGNOSIS — I951 Orthostatic hypotension: Secondary | ICD-10-CM | POA: Diagnosis not present

## 2020-05-19 DIAGNOSIS — R296 Repeated falls: Secondary | ICD-10-CM | POA: Diagnosis not present

## 2020-05-19 DIAGNOSIS — I2510 Atherosclerotic heart disease of native coronary artery without angina pectoris: Secondary | ICD-10-CM | POA: Diagnosis not present

## 2020-05-19 DIAGNOSIS — I69398 Other sequelae of cerebral infarction: Secondary | ICD-10-CM | POA: Diagnosis not present

## 2020-05-19 DIAGNOSIS — G9340 Encephalopathy, unspecified: Secondary | ICD-10-CM | POA: Diagnosis not present

## 2020-05-22 DIAGNOSIS — I2510 Atherosclerotic heart disease of native coronary artery without angina pectoris: Secondary | ICD-10-CM | POA: Diagnosis not present

## 2020-05-22 DIAGNOSIS — H543 Unqualified visual loss, both eyes: Secondary | ICD-10-CM | POA: Diagnosis not present

## 2020-05-22 DIAGNOSIS — G901 Familial dysautonomia [Riley-Day]: Secondary | ICD-10-CM | POA: Diagnosis not present

## 2020-05-22 DIAGNOSIS — I69354 Hemiplegia and hemiparesis following cerebral infarction affecting left non-dominant side: Secondary | ICD-10-CM | POA: Diagnosis not present

## 2020-05-22 DIAGNOSIS — N183 Chronic kidney disease, stage 3 unspecified: Secondary | ICD-10-CM | POA: Diagnosis not present

## 2020-05-22 DIAGNOSIS — I129 Hypertensive chronic kidney disease with stage 1 through stage 4 chronic kidney disease, or unspecified chronic kidney disease: Secondary | ICD-10-CM | POA: Diagnosis not present

## 2020-05-22 DIAGNOSIS — I951 Orthostatic hypotension: Secondary | ICD-10-CM | POA: Diagnosis not present

## 2020-05-22 DIAGNOSIS — I69398 Other sequelae of cerebral infarction: Secondary | ICD-10-CM | POA: Diagnosis not present

## 2020-05-22 DIAGNOSIS — R296 Repeated falls: Secondary | ICD-10-CM | POA: Diagnosis not present

## 2020-05-22 DIAGNOSIS — G9340 Encephalopathy, unspecified: Secondary | ICD-10-CM | POA: Diagnosis not present

## 2020-05-22 DIAGNOSIS — D500 Iron deficiency anemia secondary to blood loss (chronic): Secondary | ICD-10-CM | POA: Diagnosis not present

## 2020-05-25 DIAGNOSIS — I10 Essential (primary) hypertension: Secondary | ICD-10-CM | POA: Diagnosis not present

## 2020-05-25 DIAGNOSIS — I6340 Cerebral infarction due to embolism of unspecified cerebral artery: Secondary | ICD-10-CM | POA: Diagnosis not present

## 2020-05-25 DIAGNOSIS — G901 Familial dysautonomia [Riley-Day]: Secondary | ICD-10-CM | POA: Diagnosis not present

## 2020-05-26 DIAGNOSIS — G9340 Encephalopathy, unspecified: Secondary | ICD-10-CM | POA: Diagnosis not present

## 2020-05-26 DIAGNOSIS — G901 Familial dysautonomia [Riley-Day]: Secondary | ICD-10-CM | POA: Diagnosis not present

## 2020-05-26 DIAGNOSIS — H543 Unqualified visual loss, both eyes: Secondary | ICD-10-CM | POA: Diagnosis not present

## 2020-05-26 DIAGNOSIS — D500 Iron deficiency anemia secondary to blood loss (chronic): Secondary | ICD-10-CM | POA: Diagnosis not present

## 2020-05-26 DIAGNOSIS — I951 Orthostatic hypotension: Secondary | ICD-10-CM | POA: Diagnosis not present

## 2020-05-26 DIAGNOSIS — I2510 Atherosclerotic heart disease of native coronary artery without angina pectoris: Secondary | ICD-10-CM | POA: Diagnosis not present

## 2020-05-26 DIAGNOSIS — I69354 Hemiplegia and hemiparesis following cerebral infarction affecting left non-dominant side: Secondary | ICD-10-CM | POA: Diagnosis not present

## 2020-05-26 DIAGNOSIS — R296 Repeated falls: Secondary | ICD-10-CM | POA: Diagnosis not present

## 2020-05-26 DIAGNOSIS — I69398 Other sequelae of cerebral infarction: Secondary | ICD-10-CM | POA: Diagnosis not present

## 2020-05-26 DIAGNOSIS — I129 Hypertensive chronic kidney disease with stage 1 through stage 4 chronic kidney disease, or unspecified chronic kidney disease: Secondary | ICD-10-CM | POA: Diagnosis not present

## 2020-05-26 DIAGNOSIS — N183 Chronic kidney disease, stage 3 unspecified: Secondary | ICD-10-CM | POA: Diagnosis not present

## 2020-06-01 DIAGNOSIS — N183 Chronic kidney disease, stage 3 unspecified: Secondary | ICD-10-CM | POA: Diagnosis not present

## 2020-06-01 DIAGNOSIS — I2510 Atherosclerotic heart disease of native coronary artery without angina pectoris: Secondary | ICD-10-CM | POA: Diagnosis not present

## 2020-06-01 DIAGNOSIS — I69354 Hemiplegia and hemiparesis following cerebral infarction affecting left non-dominant side: Secondary | ICD-10-CM | POA: Diagnosis not present

## 2020-06-01 DIAGNOSIS — D500 Iron deficiency anemia secondary to blood loss (chronic): Secondary | ICD-10-CM | POA: Diagnosis not present

## 2020-06-01 DIAGNOSIS — R296 Repeated falls: Secondary | ICD-10-CM | POA: Diagnosis not present

## 2020-06-01 DIAGNOSIS — G9340 Encephalopathy, unspecified: Secondary | ICD-10-CM | POA: Diagnosis not present

## 2020-06-01 DIAGNOSIS — I129 Hypertensive chronic kidney disease with stage 1 through stage 4 chronic kidney disease, or unspecified chronic kidney disease: Secondary | ICD-10-CM | POA: Diagnosis not present

## 2020-06-01 DIAGNOSIS — I69398 Other sequelae of cerebral infarction: Secondary | ICD-10-CM | POA: Diagnosis not present

## 2020-06-01 DIAGNOSIS — H543 Unqualified visual loss, both eyes: Secondary | ICD-10-CM | POA: Diagnosis not present

## 2020-06-01 DIAGNOSIS — G901 Familial dysautonomia [Riley-Day]: Secondary | ICD-10-CM | POA: Diagnosis not present

## 2020-06-01 DIAGNOSIS — I951 Orthostatic hypotension: Secondary | ICD-10-CM | POA: Diagnosis not present

## 2020-06-02 DIAGNOSIS — I69398 Other sequelae of cerebral infarction: Secondary | ICD-10-CM | POA: Diagnosis not present

## 2020-06-02 DIAGNOSIS — I2510 Atherosclerotic heart disease of native coronary artery without angina pectoris: Secondary | ICD-10-CM | POA: Diagnosis not present

## 2020-06-02 DIAGNOSIS — H543 Unqualified visual loss, both eyes: Secondary | ICD-10-CM | POA: Diagnosis not present

## 2020-06-02 DIAGNOSIS — D500 Iron deficiency anemia secondary to blood loss (chronic): Secondary | ICD-10-CM | POA: Diagnosis not present

## 2020-06-02 DIAGNOSIS — I951 Orthostatic hypotension: Secondary | ICD-10-CM | POA: Diagnosis not present

## 2020-06-02 DIAGNOSIS — N183 Chronic kidney disease, stage 3 unspecified: Secondary | ICD-10-CM | POA: Diagnosis not present

## 2020-06-02 DIAGNOSIS — R296 Repeated falls: Secondary | ICD-10-CM | POA: Diagnosis not present

## 2020-06-02 DIAGNOSIS — G901 Familial dysautonomia [Riley-Day]: Secondary | ICD-10-CM | POA: Diagnosis not present

## 2020-06-02 DIAGNOSIS — I129 Hypertensive chronic kidney disease with stage 1 through stage 4 chronic kidney disease, or unspecified chronic kidney disease: Secondary | ICD-10-CM | POA: Diagnosis not present

## 2020-06-02 DIAGNOSIS — I69354 Hemiplegia and hemiparesis following cerebral infarction affecting left non-dominant side: Secondary | ICD-10-CM | POA: Diagnosis not present

## 2020-06-02 DIAGNOSIS — G9340 Encephalopathy, unspecified: Secondary | ICD-10-CM | POA: Diagnosis not present

## 2020-07-12 DIAGNOSIS — R1110 Vomiting, unspecified: Secondary | ICD-10-CM | POA: Diagnosis not present

## 2020-07-12 DIAGNOSIS — R05 Cough: Secondary | ICD-10-CM | POA: Diagnosis not present

## 2020-07-12 DIAGNOSIS — J40 Bronchitis, not specified as acute or chronic: Secondary | ICD-10-CM | POA: Diagnosis not present

## 2020-07-22 ENCOUNTER — Encounter (HOSPITAL_COMMUNITY): Payer: Self-pay

## 2020-07-22 ENCOUNTER — Inpatient Hospital Stay (HOSPITAL_COMMUNITY)
Admission: EM | Admit: 2020-07-22 | Discharge: 2020-07-25 | DRG: 824 | Disposition: A | Discharge status: Home / Self Care | Payer: Medicare Other | Attending: Internal Medicine | Admitting: Internal Medicine

## 2020-07-22 ENCOUNTER — Emergency Department (HOSPITAL_COMMUNITY): Payer: Medicare Other

## 2020-07-22 ENCOUNTER — Other Ambulatory Visit: Payer: Self-pay

## 2020-07-22 DIAGNOSIS — Z905 Acquired absence of kidney: Secondary | ICD-10-CM

## 2020-07-22 DIAGNOSIS — F17210 Nicotine dependence, cigarettes, uncomplicated: Secondary | ICD-10-CM | POA: Diagnosis present

## 2020-07-22 DIAGNOSIS — N1330 Unspecified hydronephrosis: Secondary | ICD-10-CM | POA: Diagnosis not present

## 2020-07-22 DIAGNOSIS — R350 Frequency of micturition: Secondary | ICD-10-CM | POA: Diagnosis not present

## 2020-07-22 DIAGNOSIS — C799 Secondary malignant neoplasm of unspecified site: Secondary | ICD-10-CM | POA: Diagnosis not present

## 2020-07-22 DIAGNOSIS — F1010 Alcohol abuse, uncomplicated: Secondary | ICD-10-CM | POA: Diagnosis present

## 2020-07-22 DIAGNOSIS — C7951 Secondary malignant neoplasm of bone: Secondary | ICD-10-CM | POA: Diagnosis present

## 2020-07-22 DIAGNOSIS — I214 Non-ST elevation (NSTEMI) myocardial infarction: Secondary | ICD-10-CM | POA: Diagnosis present

## 2020-07-22 DIAGNOSIS — I252 Old myocardial infarction: Secondary | ICD-10-CM

## 2020-07-22 DIAGNOSIS — R32 Unspecified urinary incontinence: Secondary | ICD-10-CM | POA: Diagnosis present

## 2020-07-22 DIAGNOSIS — N131 Hydronephrosis with ureteral stricture, not elsewhere classified: Secondary | ICD-10-CM | POA: Diagnosis present

## 2020-07-22 DIAGNOSIS — Z9079 Acquired absence of other genital organ(s): Secondary | ICD-10-CM | POA: Diagnosis not present

## 2020-07-22 DIAGNOSIS — Z79899 Other long term (current) drug therapy: Secondary | ICD-10-CM | POA: Diagnosis not present

## 2020-07-22 DIAGNOSIS — N320 Bladder-neck obstruction: Secondary | ICD-10-CM | POA: Diagnosis present

## 2020-07-22 DIAGNOSIS — F0390 Unspecified dementia without behavioral disturbance: Secondary | ICD-10-CM | POA: Diagnosis present

## 2020-07-22 DIAGNOSIS — Z8673 Personal history of transient ischemic attack (TIA), and cerebral infarction without residual deficits: Secondary | ICD-10-CM

## 2020-07-22 DIAGNOSIS — Z20822 Contact with and (suspected) exposure to covid-19: Secondary | ICD-10-CM | POA: Diagnosis not present

## 2020-07-22 DIAGNOSIS — N179 Acute kidney failure, unspecified: Secondary | ICD-10-CM | POA: Diagnosis present

## 2020-07-22 DIAGNOSIS — I2510 Atherosclerotic heart disease of native coronary artery without angina pectoris: Secondary | ICD-10-CM | POA: Diagnosis not present

## 2020-07-22 DIAGNOSIS — I129 Hypertensive chronic kidney disease with stage 1 through stage 4 chronic kidney disease, or unspecified chronic kidney disease: Secondary | ICD-10-CM | POA: Diagnosis not present

## 2020-07-22 DIAGNOSIS — Z955 Presence of coronary angioplasty implant and graft: Secondary | ICD-10-CM

## 2020-07-22 DIAGNOSIS — N289 Disorder of kidney and ureter, unspecified: Secondary | ICD-10-CM | POA: Diagnosis not present

## 2020-07-22 DIAGNOSIS — N183 Chronic kidney disease, stage 3 unspecified: Secondary | ICD-10-CM | POA: Diagnosis present

## 2020-07-22 DIAGNOSIS — C7911 Secondary malignant neoplasm of bladder: Secondary | ICD-10-CM | POA: Diagnosis not present

## 2020-07-22 DIAGNOSIS — G4700 Insomnia, unspecified: Secondary | ICD-10-CM | POA: Diagnosis present

## 2020-07-22 DIAGNOSIS — C775 Secondary and unspecified malignant neoplasm of intrapelvic lymph nodes: Principal | ICD-10-CM | POA: Diagnosis present

## 2020-07-22 DIAGNOSIS — N139 Obstructive and reflux uropathy, unspecified: Secondary | ICD-10-CM

## 2020-07-22 DIAGNOSIS — G40909 Epilepsy, unspecified, not intractable, without status epilepticus: Secondary | ICD-10-CM | POA: Diagnosis present

## 2020-07-22 DIAGNOSIS — Z8546 Personal history of malignant neoplasm of prostate: Secondary | ICD-10-CM

## 2020-07-22 DIAGNOSIS — I10 Essential (primary) hypertension: Secondary | ICD-10-CM | POA: Diagnosis present

## 2020-07-22 DIAGNOSIS — Z720 Tobacco use: Secondary | ICD-10-CM | POA: Diagnosis present

## 2020-07-22 DIAGNOSIS — N3289 Other specified disorders of bladder: Secondary | ICD-10-CM | POA: Diagnosis not present

## 2020-07-22 DIAGNOSIS — N138 Other obstructive and reflux uropathy: Secondary | ICD-10-CM | POA: Diagnosis not present

## 2020-07-22 DIAGNOSIS — I700 Atherosclerosis of aorta: Secondary | ICD-10-CM | POA: Diagnosis not present

## 2020-07-22 LAB — CBC WITH DIFFERENTIAL/PLATELET
Abs Immature Granulocytes: 0.05 10*3/uL (ref 0.00–0.07)
Basophils Absolute: 0.1 10*3/uL (ref 0.0–0.1)
Basophils Relative: 1 %
Eosinophils Absolute: 0.4 10*3/uL (ref 0.0–0.5)
Eosinophils Relative: 5 %
HCT: 36.9 % — ABNORMAL LOW (ref 39.0–52.0)
Hemoglobin: 11.9 g/dL — ABNORMAL LOW (ref 13.0–17.0)
Immature Granulocytes: 1 %
Lymphocytes Relative: 23 %
Lymphs Abs: 2 10*3/uL (ref 0.7–4.0)
MCH: 28.5 pg (ref 26.0–34.0)
MCHC: 32.2 g/dL (ref 30.0–36.0)
MCV: 88.3 fL (ref 80.0–100.0)
Monocytes Absolute: 0.7 10*3/uL (ref 0.1–1.0)
Monocytes Relative: 8 %
Neutro Abs: 5.4 10*3/uL (ref 1.7–7.7)
Neutrophils Relative %: 62 %
Platelets: 247 10*3/uL (ref 150–400)
RBC: 4.18 MIL/uL — ABNORMAL LOW (ref 4.22–5.81)
RDW: 14.8 % (ref 11.5–15.5)
WBC: 8.7 10*3/uL (ref 4.0–10.5)
nRBC: 0 % (ref 0.0–0.2)

## 2020-07-22 LAB — BASIC METABOLIC PANEL
Anion gap: 16 — ABNORMAL HIGH (ref 5–15)
BUN: 29 mg/dL — ABNORMAL HIGH (ref 6–20)
CO2: 21 mmol/L — ABNORMAL LOW (ref 22–32)
Calcium: 8.7 mg/dL — ABNORMAL LOW (ref 8.9–10.3)
Chloride: 105 mmol/L (ref 98–111)
Creatinine, Ser: 1.99 mg/dL — ABNORMAL HIGH (ref 0.61–1.24)
GFR calc Af Amer: 42 mL/min — ABNORMAL LOW (ref >60)
GFR calc non Af Amer: 36 mL/min — ABNORMAL LOW (ref >60)
Glucose, Bld: 157 mg/dL — ABNORMAL HIGH (ref 70–99)
Potassium: 4.7 mmol/L (ref 3.5–5.1)
Sodium: 142 mmol/L (ref 135–145)

## 2020-07-22 LAB — SARS CORONAVIRUS 2 BY RT PCR (HOSPITAL ORDER, PERFORMED IN ~~LOC~~ HOSPITAL LAB): SARS Coronavirus 2: NEGATIVE

## 2020-07-22 MED ORDER — HYDROCODONE-ACETAMINOPHEN 5-325 MG PO TABS
1.0000 | ORAL_TABLET | ORAL | Status: DC | PRN
  Administered 2020-07-23: 1 via ORAL
  Administered 2020-07-24 – 2020-07-25 (×3): 2 via ORAL
  Filled 2020-07-22: qty 1
  Filled 2020-07-22 (×3): qty 2

## 2020-07-22 MED ORDER — DIVALPROEX SODIUM 250 MG PO DR TAB
500.0000 mg | DELAYED_RELEASE_TABLET | Freq: Two times a day (BID) | ORAL | Status: DC
  Administered 2020-07-22 – 2020-07-25 (×6): 500 mg via ORAL
  Filled 2020-07-22 (×6): qty 2

## 2020-07-22 MED ORDER — ADULT MULTIVITAMIN W/MINERALS CH
1.0000 | ORAL_TABLET | Freq: Every day | ORAL | Status: DC
  Administered 2020-07-22 – 2020-07-25 (×4): 1 via ORAL
  Filled 2020-07-22 (×4): qty 1

## 2020-07-22 MED ORDER — FOLIC ACID 1 MG PO TABS: 1.0000 mg | ORAL_TABLET | Freq: Every day | ORAL | Status: DC

## 2020-07-22 MED ORDER — THIAMINE HCL 100 MG PO TABS: 100.0000 mg | ORAL_TABLET | Freq: Every day | ORAL | Status: DC

## 2020-07-22 MED ORDER — PROMETHAZINE HCL 25 MG PO TABS: 12.5000 mg | ORAL_TABLET | Freq: Four times a day (QID) | ORAL | Status: DC | PRN

## 2020-07-22 MED ORDER — TRAZODONE HCL 50 MG PO TABS
50.0000 mg | ORAL_TABLET | Freq: Every day | ORAL | Status: DC
  Administered 2020-07-22 – 2020-07-24 (×3): 50 mg via ORAL
  Filled 2020-07-22 (×3): qty 1

## 2020-07-22 MED ORDER — LEVETIRACETAM 500 MG PO TABS
500.0000 mg | ORAL_TABLET | Freq: Two times a day (BID) | ORAL | Status: DC
  Administered 2020-07-22 – 2020-07-25 (×6): 500 mg via ORAL
  Filled 2020-07-22 (×6): qty 1

## 2020-07-22 MED ORDER — SODIUM CHLORIDE 0.9 % IV BOLUS
1000.0000 mL | Freq: Once | INTRAVENOUS | Status: AC
  Administered 2020-07-22: 1000 mL via INTRAVENOUS

## 2020-07-22 MED ORDER — RISPERIDONE 1 MG PO TABS
1.0000 mg | ORAL_TABLET | Freq: Two times a day (BID) | ORAL | Status: DC
  Administered 2020-07-22 – 2020-07-25 (×6): 1 mg via ORAL
  Filled 2020-07-22 (×6): qty 1

## 2020-07-22 NOTE — H&P (Signed)
Douglas Fowler is an 54 y.o. male.   Chief Complaint: Urinary frequency and weakness. HPI: The patient is a 55 yr old man who is unable to participate in history due to a previous stroke. The patient has been needing to urinate frequently overnight.   He carries a past medical history significant for nephrectomy due to prostate cancer, seizure disorder, HTN, CAD, NSTEMI, AKI, alcohol use disorder, dementia, nicotine dependence, insomnia, and a stroke.   In the ED the patient was found to have an elevation of his creatinine at 1.99 above his baseline of 1.3-1.4. CT of his abdomen and pelvis demonstrated: 1. Findings most consistent with metastatic prostate cancer. There is soft tissue nodularity at the base of the bladder, as well as multiple enlarged lymph nodes throughout the retroperitoneum. Diffuse skeletal sclerotic metastases are noted. 2. High-grade right-sided obstructive uropathy without evidence of urinary tract calculus. The obstruction is likely due to the soft tissue mass is seen at the base of the bladder due to local recurrence of prostate cancer. However, cystoscopy could be performed to assess for uroepithelial process as well as allowed for decompression and stent placement. 3.  Aortic Atherosclerosis (ICD10-I70.0).  Dr. Gloriann Loan of Urology was consulted. He expressed concern that this could represent recurrence of prostate cancer.  [facility] were consulted to admit the patient for further evaluation and treatment. The patient will be admitted and he will be kept NPO overnight for an anticipated procedure tomorrow.  Past Medical History:  Diagnosis Date  . CAD (coronary artery disease)    s/p PCI with stents x 3  . Chronic alcohol abuse   . CVA (cerebral vascular accident) ([place])    multiple  . Hypertension   . Seizures ([place])   . Tobacco abuse     Past Surgical History:  Procedure Laterality Date  . CORONARY ANGIOPLASTY WITH STENT PLACEMENT     stents x 3     Family History  Family history unknown: Yes   Social History:  reports that he has been smoking. He has a 5.00 pack-year smoking history. He has never used smokeless tobacco. He reports previous alcohol use. He reports current drug use. Drug: Marijuana. (Not in a hospital admission)   Allergies: No Known Allergies  Review of systems not obtained due to patient factors.  General appearance: alert, cooperative and no distress Head: Normocephalic, without obvious abnormality, atraumatic Eyes: conjunctivae/corneas clear. PERRL, EOM's intact. Fundi benign. Throat: lips, mucosa, and tongue normal; teeth and gums normal Neck: no adenopathy, no carotid bruit, no JVD, supple, symmetrical, trachea midline and thyroid not enlarged, symmetric, no tenderness/mass/nodules Resp: No increased work of breathing. No wheezes, rales, or rhonchi. No tactile fremitus. Chest wall: no tenderness Cardio: regular rate and rhythm, S1, S2 normal, no murmur, click, rub or gallop GI: soft, non-tender; bowel sounds normal; no masses,  no organomegaly Extremities: extremities normal, atraumatic, no cyanosis or edema Pulses: 2+ and symmetric Skin: Skin color, texture, turgor normal. No rashes or lesions Lymph nodes: Cervical, supraclavicular, and axillary nodes normal. Neurologic: Grossly normal  Results for orders placed or performed during the hospital encounter of 07/22/20 (from the past 48 hour(s))  CBC with Differential     Status: Abnormal   Collection Time: 07/22/20  1:38 PM  Result Value Ref Range   WBC 8.7 4.0 - 10.5 K/uL   RBC 4.18 (L) 4.22 - 5.81 MIL/uL   Hemoglobin 11.9 (L) 13.0 - 17.0 g/dL   HCT 36.9 (L) 39 - 52 %  MCV 88.3 80.0 - 100.0 fL   MCH 28.5 26.0 - 34.0 pg   MCHC 32.2 30.0 - 36.0 g/dL   RDW 14.8 11.5 - 15.5 %   Platelets 247 150 - 400 K/uL   nRBC 0.0 0.0 - 0.2 %   Neutrophils Relative % 62 %   Neutro Abs 5.4 1.7 - 7.7 K/uL   Lymphocytes Relative 23 %   Lymphs Abs 2.0 0.7 - 4.0  K/uL   Monocytes Relative 8 %   Monocytes Absolute 0.7 0 - 1 K/uL   Eosinophils Relative 5 %   Eosinophils Absolute 0.4 0 - 0 K/uL   Basophils Relative 1 %   Basophils Absolute 0.1 0 - 0 K/uL   Immature Granulocytes 1 %   Abs Immature Granulocytes 0.05 0.00 - 0.07 K/uL    Comment: Performed at [facility], [place] [address] [place], [place] [address]  Basic metabolic panel     Status: Abnormal   Collection Time: 07/22/20  1:38 PM  Result Value Ref Range   Sodium 142 135 - 145 mmol/L   Potassium 4.7 3.5 - 5.1 mmol/L   Chloride 105 98 - 111 mmol/L   CO2 21 (L) 22 - 32 mmol/L   Glucose, Bld 157 (H) 70 - 99 mg/dL    Comment: Glucose reference range applies only to samples taken after fasting for at least 8 hours.   BUN 29 (H) 6 - 20 mg/dL   Creatinine, Ser 1.99 (H) 0.61 - 1.24 mg/dL   Calcium 8.7 (L) 8.9 - 10.3 mg/dL   GFR calc non Af Amer 36 (L) >60 mL/min   GFR calc Af Amer 42 (L) >60 mL/min   Anion gap 16 (H) 5 - 15    Comment: Performed at [facility], [place] [address], [place], [place] [address]   @[email or web address] @  Blood pressure 127/68, pulse 93, temperature 97.9 F (36.6 C), resp. rate 18, height 5\' 6"  (1.676 m), weight 95.3 kg, SpO2 98 %.   Assessment/Plan Problem  Acute Urinary Obstruction  Nstemi (Non-St Elevated Myocardial Infarction) ([facility])  Chronic Alcohol Abuse  Essential Hypertension  Cad (Coronary Artery Disease)  Tobacco Abuse  Aki (Acute Kidney Injury) ([facility])   Acute urinary obstruction: Urology has been consulted. The patient will be kept overnight for possible procedure with urology in the am. Will place foley for any distress from obstruction.  CAD/History of NSTEMI: Noted. The patient will be monitored on telemetry. Plavix held due to procedure tomorrow.  AKI: Due to obstruction. Continue IV fluids. Monitor electrolytes, creatinine and volume status.  Essential Hypertension: Monitor. The patient is currently normotensive.  Coreg recently stopped due to hypotension and dizziness.  Chronic alcohol abuse: CIWA protocol.  Seizure Disorder: Continue Keppra as at home.  Tobacco abuse: Nicotine patch.  Dementia: Noted.   I have seen and examined this patient myself. I have spent 76 minutes in his evaluation and care.  DVT Prophylaxis: SCD's CODE STATUS: Full Code Family Communication: Spouse is at bedside Disposition: Status is: Inpatient  Remains inpatient appropriate because:Inpatient level of care appropriate due to severity of illness  Dispo: The patient is from: Home              Anticipated d/c is to: Home              Anticipated d/c date is: 2 days              Patient currently is not medically stable to d/c.  Liddy Deam 07/22/2020, 5:13 PM

## 2020-07-22 NOTE — ED Triage Notes (Signed)
Pt has a hx of prostate and kidney cancer. Pt had kidney and prostate removed 10 years ago. Per wife, pt has had frequent urination every 10 min x2 days. Pt is also c/o back pain for several days. Per wife urine has a foul odor. Pt is A&Ox4.

## 2020-07-22 NOTE — ED Provider Notes (Signed)
Physical Exam  BP 127/68   Pulse 93   Temp 97.9 F (36.6 C)   Resp 18   Ht 5\' 6"  (1.676 m)   Wt 95.3 kg   SpO2 98%   BMI 33.89 kg/m   Physical Exam Vitals and nursing note reviewed.  Constitutional:      General: He is not in acute distress.    Appearance: He is well-developed. He is not diaphoretic.  HENT:     Head: Normocephalic and atraumatic.  Eyes:     General: No scleral icterus.    Conjunctiva/sclera: Conjunctivae normal.  Pulmonary:     Effort: Pulmonary effort is normal. No respiratory distress.  Musculoskeletal:     Cervical back: Normal range of motion.  Skin:    Findings: No rash.  Neurological:     Mental Status: He is alert.     ED Course/Procedures   Clinical Course as of Jul 23 1639  Sat Jul 22, 2020  1504 10 male with a history of nephrectomy presenting to the emergency department with urinary frequency and weakness.  Patient is a poor historian due to prior strokes.  His wife provides abdominal history.  She reports he has been urinating more frequently than normal, although he has some urinary incontinence at baseline.  The patient is not in any distress on exam.  He has no leukocytosis or fever.  BMP is notable for some elevation in his creatinine, today 1.99, with an elevated BUN.  His wife does report he has been eating and drinking poorly the past few days.  We will give him a liter of fluid and recheck his BMP.  He is also pending a CT scan to look for evidence of urinary obstruction or kidney stone.  He is pending a UA as well to look for signs of infection.   [MT]  1505 If his entire work-up is unremarkable and his BMP shows some improvement after fluids, he can be discharged with encouragement to continue drinking lots of fluids at home.  I stressed this to his wife.     [MT]  0626 NPO after mn.   [HK]    Clinical Course User Index [HK] Delia Heady, PA-C [MT] Langston Masker, Carola Rhine, MD    Procedures  MDM   Care of patient assumed from PA  [facility] at 3:30 PM.  Agree with history, physical exam and plan.  See their note for further details.  Briefly, 54 y.o. male with PMH/PSH as below who presents with urinary frequency since yesterday.  Wife reports passing small amounts of urine frequently every 10 minutes or so since yesterday.  Since being here in the ER in the waiting room, he has not passed any urine for the past 2-1/2 hours.  No dysuria or hematuria.  He is status post nephrectomy and prostatectomy related to prostate cancer about 10 years ago.  Negative Covid test on 07/12/2020. Lab work significant for BMP showing creatinine of 1.99 which is slightly higher than his baseline.  Elevated anion gap of 16.  Past Medical History:  Diagnosis Date  . CAD (coronary artery disease)    s/p PCI with stents x 3  . Chronic alcohol abuse   . CVA (cerebral vascular accident) ([place])    multiple  . Hypertension   . Seizures ([place])   . Tobacco abuse    Past Surgical History:  Procedure Laterality Date  . CORONARY ANGIOPLASTY WITH STENT PLACEMENT     stents x 3  Current Plan: Give IV fluids, CT renal stone study to evaluate for structural cause of symptoms.  We will need to recheck BMP after fluid bolus to ensure improvement.   MDM/ED Course:  3:52 PM CT renal stone study shows findings consistent with metastatic prostate cancer and possible bladder mass causing obstruction. Bladder scan shows 60 cc of urine without evidence of distention.  Will need to consult urology for recommendations.  4:08 PM Spoke to Dr. Gloriann Loan of urology.  Reviewed imaging.  Recommend that he be admitted for possible stenting in the morning due to his history of nephrectomy with CT findings. Give abx if signs of infection. Informed patient and wife of these findings.  Patient does not want to stay but is agreeable when I explained risks of leaving.  4:41 PM Will admit to medicine service.   Consults: Urology- Dr. Gloriann Loan Hospitalist- Dr.  Benny Lennert  Significant labs/images: CT Renal Stone Study  Result Date: 07/22/2020 CLINICAL DATA:  Left renal cell carcinoma status post nephrectomy, lethargy, right flank pain EXAM: CT ABDOMEN AND PELVIS WITHOUT CONTRAST TECHNIQUE: Multidetector CT imaging of the abdomen and pelvis was performed following the standard protocol without IV contrast. COMPARISON:  02/18/2018 FINDINGS: Lower chest: Hypoventilatory changes are seen at the lung bases. No acute pleural or parenchymal lung disease. Hepatobiliary: No focal liver abnormality is seen. No gallstones, gallbladder wall thickening, or biliary dilatation. Pancreas: Unremarkable. No pancreatic ductal dilatation or surrounding inflammatory changes. Spleen: Normal in size without focal abnormality. Adrenals/Urinary Tract: Left kidney is surgically absent. Left adrenal has also likely been surgically removed. There is high-grade right-sided obstructive uropathy without evidence of radiopaque calculus. Dilated ureter extends to the right UVJ. There is eccentric bladder wall thickening near the right UVJ, though evaluation of the bladder is limited due to under distension. Cystoscopy is recommended for further evaluation. Stomach/Bowel: No bowel obstruction or ileus. Normal appendix right lower quadrant. No bowel wall thickening or inflammatory change. Vascular/Lymphatic: Extensive atherosclerosis is seen throughout the aorta and its distal branches. There are abnormal lymph nodes throughout the retroperitoneum, increased in number. Largest anterior to the right crus of the diaphragm image 23 measures 11 mm in short axis. Lymphadenopathy extends along the right pelvic sidewall, largest node along the right iliac chain image 65 measuring 15 mm in short axis. Findings favor metastatic disease. There is mild fat stranding in the retroperitoneum and along the right pelvic sidewall. Reproductive: Prostate is surgically absent. Other: There is no free fluid or free gas. No  abdominal wall hernia. Musculoskeletal: Sclerotic lesions are seen throughout the spine, thoracic cage, bony pelvis, and proximal femurs consistent with diffuse bony metastases. No acute displaced fracture. Reconstructed images demonstrate no additional findings. IMPRESSION: 1. Findings most consistent with metastatic prostate cancer. There is soft tissue nodularity at the base of the bladder, as well as multiple enlarged lymph nodes throughout the retroperitoneum. Diffuse skeletal sclerotic metastases are noted. 2. High-grade right-sided obstructive uropathy without evidence of urinary tract calculus. The obstruction is likely due to the soft tissue mass is seen at the base of the bladder due to local recurrence of prostate cancer. However, cystoscopy could be performed to assess for uroepithelial process as well as allowed for decompression and stent placement. 3.  Aortic Atherosclerosis (ICD10-I70.0). Electronically Signed   By: Randa Ngo M.D.   On: 07/22/2020 15:33    I personally reviewed and interpreted all labs.  All imaging, if done today, including plain films, CT scans, and ultrasounds, independently reviewed by me, and  interpretations confirmed via formal radiology reads.  The plan for this patient was discussed with Dr. Tyrone Nine, who voiced agreement and who oversaw evaluation and treatment of this patient.  Portions of this note were generated with [employer]. Dictation errors may occur despite best attempts at proofreading.      Delia Heady, PA-C 07/22/20 [employer], [place], DO 07/22/20 1704

## 2020-07-22 NOTE — ED Provider Notes (Signed)
[facility] DEPT Provider Note   CSN: [id number] Arrival date & time: 07/22/20  1208     History Chief Complaint  Patient presents with  . Urinary Frequency    Douglas Fowler is a 54 y.o. male with history of coronary artery disease, alcohol use, hypertension, tobacco abuse, CVA, seizures presents with wife for evaluation of acute onset urinary frequency noted yesterday.  Wife notes that the patient passed small amounts of urine frequently, every 10 minutes or so.  She states that he is incontinent of urine and wears a depends but sometimes is able to ambulate to the bathroom to urinate as well.  Wife notes he has not had any urine output since he has been here (about 2-1/2 hours). The patient denies dysuria and wife has not noted any hematuria.  No fevers, shortness of breath, chest pain, diarrhea.  He did complain of back pain to his wife but denies any to me at this time.  Wife notes that the patient is several years status post nephrectomy and prostatectomy in [place] related to prostate cancer.  Wife notes that they did go to urgent care on 07/12/2020 for nausea, vomiting, and cough where the patient tested negative for Covid. Has not had any persistent vomiting but has had decreased oral intake over the last couple of days.  The history is provided by the patient and the spouse.       Past Medical History:  Diagnosis Date  . CAD (coronary artery disease)    s/p PCI with stents x 3  . Chronic alcohol abuse   . CVA (cerebral vascular accident) ([place])    multiple  . Hypertension   . Seizures ([place])   . Tobacco abuse     Patient Active Problem List   Diagnosis Date Noted  . Dysautonomia ([place]) 05/13/2020  . MDD (major depressive disorder) 11/17/2019  . Acute lower UTI 03/02/2019  . NSTEMI (non-ST elevated myocardial infarction) ([place]) 03/02/2019  . Elevated troponin 03/01/2019  . Chronic alcohol abuse 03/01/2019  . Acute  encephalopathy 02/28/2019  . Alcohol withdrawal delirium ([place]) 12/11/2018  . Alcohol withdrawal ([place]) 12/11/2018  . Alcohol withdrawal seizure with complication, with unspecified complication ([place]) 21/30/8657  . Essential hypertension 08/05/2018  . CAD (coronary artery disease) 08/05/2018  . Tobacco abuse 08/05/2018  . Hypokalemia   . AKI (acute kidney injury) ([place])   . Acute blood loss anemia   . CVA (cerebral vascular accident) ([place]) 08/04/2018    Past Surgical History:  Procedure Laterality Date  . CORONARY ANGIOPLASTY WITH STENT PLACEMENT     stents x 3       Family History  Family history unknown: Yes    Social History   Tobacco Use  . Smoking status: Current Every Day Smoker    Packs/day: 1.00    Years: 5.00    Pack years: 5.00  . Smokeless tobacco: Never Used  Substance Use Topics  . Alcohol use: Not Currently    Comment: 1 gallon of whisky Q72 hours  . Drug use: Yes    Types: Marijuana    Home Medications Prior to Admission medications   Medication Sig Start Date End Date Taking? Authorizing Provider  aspirin EC 81 MG EC tablet Take 1 tablet (81 mg total) by mouth daily. Patient not taking: Reported on 05/12/2020 03/05/19   Geradine Girt, DO  atorvastatin (LIPITOR) 80 MG tablet Take 1 tablet (80 mg total) by mouth daily at 6 PM. 12/18/18  Sheikh, Omair Latif, DO  buPROPion [facility] SR) 150 MG 12 hr tablet Take 150 mg by mouth 2 (two) times daily. Patient not taking: Reported on 05/12/2020 10/08/19   [provider]  carvedilol (COREG) 3.125 MG tablet Take 1 tablet (3.125 mg total) by mouth 2 (two) times daily with a meal. 05/14/20 06/13/20  Cherylann Ratel A, DO  clopidogrel (PLAVIX) 75 MG tablet Take 1 tablet (75 mg total) by mouth daily. 03/05/19   Geradine Girt, DO  folic acid (FOLVITE) 1 MG tablet Take 1 tablet (1 mg total) by mouth daily. Patient not taking: Reported on 05/12/2020 12/19/18   Raiford Noble Latif, DO  levETIRAcetam (KEPPRA) 500 MG  tablet Take 1 tablet (500 mg total) by mouth 2 (two) times daily. 12/18/18   Raiford Noble Latif, DO  multivitamin (PROSIGHT) TABS tablet Take 1 tablet by mouth daily. Patient not taking: Reported on 02/28/2019 12/19/18   Raiford Noble Latif, DO  Omega-3 Fatty Acids (FISH OIL) 1000 MG CAPS Take 1,000 mg by mouth daily.    [provider]  risperiDONE (RISPERDAL) 1 MG tablet Take 1 tablet (1 mg total) by mouth 2 (two) times daily. 11/18/19   Sharma Covert, MD  thiamine 100 MG tablet Take 1 tablet (100 mg total) by mouth daily. Patient not taking: Reported on 02/28/2019 12/19/18   Raiford Noble Latif, DO  traZODone (DESYREL) 50 MG tablet Take 1 tablet (50 mg total) by mouth at bedtime. 03/05/19   Geradine Girt, DO    Allergies    Patient has no known allergies.  Review of Systems   Review of Systems  Constitutional: Negative for chills and fever.  Respiratory: Negative for shortness of breath.   Cardiovascular: Negative for chest pain.  Gastrointestinal: Negative for abdominal pain.  Genitourinary: Positive for frequency. Negative for hematuria.  Musculoskeletal: Positive for back pain.    Physical Exam Updated Vital Signs BP 127/68   Pulse 93   Temp 97.9 F (36.6 C)   Resp 18   Ht 5\' 6"  (1.676 m)   Wt 95.3 kg   SpO2 98%   BMI 33.89 kg/m   Physical Exam Vitals and nursing note reviewed.  Constitutional:      General: He is not in acute distress.    Appearance: He is well-developed.  HENT:     Head: Normocephalic and atraumatic.  Eyes:     General:        Right eye: No discharge.        Left eye: No discharge.     Conjunctiva/sclera: Conjunctivae normal.  Neck:     Vascular: No JVD.     Trachea: No tracheal deviation.  Cardiovascular:     Rate and Rhythm: Normal rate and regular rhythm.  Pulmonary:     Effort: Pulmonary effort is normal.  Abdominal:     General: Bowel sounds are normal. There is no distension.     Palpations: Abdomen is soft.      Tenderness: There is no abdominal tenderness. There is no right CVA tenderness, left CVA tenderness, guarding or rebound.  Musculoskeletal:     Cervical back: Neck supple.     Comments: No midline spine tenderness, no paralumbar muscle tenderness.  No deformity, crepitus, or step off noted  Skin:    General: Skin is warm and dry.     Findings: No erythema.  Neurological:     Mental Status: He is alert.  Psychiatric:  Behavior: Behavior normal.     ED Results / Procedures / Treatments   Labs (all labs ordered are listed, but only abnormal results are displayed) Labs Reviewed  CBC WITH DIFFERENTIAL/PLATELET - Abnormal; Notable for the following components:      Result Value   RBC 4.18 (*)    Hemoglobin 11.9 (*)    HCT 36.9 (*)    All other components within normal limits  BASIC METABOLIC PANEL - Abnormal; Notable for the following components:   CO2 21 (*)    Glucose, Bld 157 (*)    BUN 29 (*)    Creatinine, Ser 1.99 (*)    Calcium 8.7 (*)    GFR calc non Af Amer 36 (*)    GFR calc Af Amer 42 (*)    Anion gap 16 (*)    All other components within normal limits  URINE CULTURE  URINALYSIS, ROUTINE W REFLEX MICROSCOPIC    EKG None  Radiology No results found.  Procedures Procedures (including critical care time)  Medications Ordered in ED Medications  sodium chloride 0.9 % bolus 1,000 mL (has no administration in time range)    ED Course  I have reviewed the triage vital signs and the nursing notes.  Pertinent labs & imaging results that were available during my care of the patient were reviewed by me and considered in my medical decision making (see chart for details).  Clinical Course as of Jul 22 1513  Sat Jul 22, 2020  1504 45 male with a history of nephrectomy presenting to the emergency department with urinary frequency and weakness.  Patient is a poor historian due to prior strokes.  His wife provides abdominal history.  She reports he has been  urinating more frequently than normal, although he has some urinary incontinence at baseline.  The patient is not in any distress on exam.  He has no leukocytosis or fever.  BMP is notable for some elevation in his creatinine, today 1.99, with an elevated BUN.  His wife does report he has been eating and drinking poorly the past few days.  We will give him a liter of fluid and recheck his BMP.  He is also pending a CT scan to look for evidence of urinary obstruction or kidney stone.  He is pending a UA as well to look for signs of infection.   [MT]  1505 If his entire work-up is unremarkable and his BMP shows some improvement after fluids, he can be discharged with encouragement to continue drinking lots of fluids at home.  I stressed this to his wife.     [MT]    Clinical Course User Index [MT] Trifan, Carola Rhine, MD   MDM Rules/Calculators/A&P                          Patient presents brought in by wife for evaluation of urinary frequency, decreased urine output. Recently had an illness and which he had some nausea and vomiting as well as a cough. Tested negative for Covid. He is status post nephrectomy several years ago. He is afebrile in the ED, vital signs are stable. He is nontoxic in appearance. He complained of back pain to the wife but he has no midline spine tenderness on examination and no CVA tenderness. Abdomen is soft and nontender. He has not been able to urinate while in the ED yet. We will plan to obtain basic labs, CT renal stone study, UA and  urine culture and reassess.  Lab work reviewed and interpreted by myself shows no leukocytosis, stable anemia, worsening kidney function with slight elevation in BUN and creatinine as compared to lab work from May 14, 2020. Anion gap is also elevated here, likely in the setting of dehydration. He is nonseptic in appearance. We will give IV fluid bolus. On review of the patient's renal stone study, he does not appear to be retaining urine. He does  have a solitary kidney.  3:14 PM Signed out care to oncoming provider PA [facility]. Pending results of renal stone study, IV fluid bolus and urinalysis. Will plan to recheck BMP after fluid bolus to assess for improvement in renal function and elevated anion gap. If labs improved and patient is tolerating p.o. in the ED he will likely be stable for discharge home with close outpatient PCP or urology follow-up. Doubt acute surgical abdominal pathology at this time.   Final Clinical Impression(s) / ED Diagnoses Final diagnoses:  Renal insufficiency  Urinary frequency    Rx / DC Orders ED Discharge Orders    None       Debroah Baller 07/22/20 1515    Wyvonnia Dusky, MD 07/22/20 [phone]

## 2020-07-23 ENCOUNTER — Inpatient Hospital Stay (HOSPITAL_COMMUNITY): Payer: Medicare Other | Admitting: Registered Nurse

## 2020-07-23 ENCOUNTER — Inpatient Hospital Stay (HOSPITAL_COMMUNITY): Payer: Medicare Other

## 2020-07-23 ENCOUNTER — Encounter (HOSPITAL_COMMUNITY)
Admission: EM | Disposition: A | Discharge status: Home / Self Care | Payer: Self-pay | Source: Home / Self Care | Attending: Internal Medicine

## 2020-07-23 HISTORY — PX: CYSTOSCOPY WITH STENT PLACEMENT: SHX5790

## 2020-07-23 LAB — BASIC METABOLIC PANEL
Anion gap: 8 (ref 5–15)
BUN: 27 mg/dL — ABNORMAL HIGH (ref 6–20)
CO2: 23 mmol/L (ref 22–32)
Calcium: 8.4 mg/dL — ABNORMAL LOW (ref 8.9–10.3)
Chloride: 108 mmol/L (ref 98–111)
Creatinine, Ser: 1.64 mg/dL — ABNORMAL HIGH (ref 0.61–1.24)
GFR calc Af Amer: 53 mL/min — ABNORMAL LOW (ref >60)
GFR calc non Af Amer: 46 mL/min — ABNORMAL LOW (ref >60)
Glucose, Bld: 116 mg/dL — ABNORMAL HIGH (ref 70–99)
Potassium: 3.9 mmol/L (ref 3.5–5.1)
Sodium: 139 mmol/L (ref 135–145)

## 2020-07-23 LAB — URINALYSIS, ROUTINE W REFLEX MICROSCOPIC
Bilirubin Urine: NEGATIVE
Glucose, UA: NEGATIVE mg/dL
Hgb urine dipstick: NEGATIVE
Ketones, ur: NEGATIVE mg/dL
Leukocytes,Ua: NEGATIVE
Nitrite: NEGATIVE
Protein, ur: NEGATIVE mg/dL
Specific Gravity, Urine: 1.011 (ref 1.005–1.030)
pH: 7 (ref 5.0–8.0)

## 2020-07-23 LAB — CBC
HCT: 32 % — ABNORMAL LOW (ref 39.0–52.0)
Hemoglobin: 10.3 g/dL — ABNORMAL LOW (ref 13.0–17.0)
MCH: 28.4 pg (ref 26.0–34.0)
MCHC: 32.2 g/dL (ref 30.0–36.0)
MCV: 88.2 fL (ref 80.0–100.0)
Platelets: 203 10*3/uL (ref 150–400)
RBC: 3.63 MIL/uL — ABNORMAL LOW (ref 4.22–5.81)
RDW: 14.6 % (ref 11.5–15.5)
WBC: 6.4 10*3/uL (ref 4.0–10.5)
nRBC: 0 % (ref 0.0–0.2)

## 2020-07-23 LAB — PSA: Prostatic Specific Antigen: 365.81 ng/mL — ABNORMAL HIGH (ref 0.00–4.00)

## 2020-07-23 SURGERY — CYSTOSCOPY, WITH STENT INSERTION
Anesthesia: General | Site: Ureter | Laterality: Right

## 2020-07-23 MED ORDER — CEFAZOLIN SODIUM-DEXTROSE 2-4 GM/100ML-% IV SOLN: 2.0000 g | INTRAVENOUS | Status: DC

## 2020-07-23 MED ORDER — CHLORHEXIDINE GLUCONATE CLOTH 2 % EX PADS
6.0000 | MEDICATED_PAD | Freq: Every day | CUTANEOUS | Status: DC
  Administered 2020-07-23 – 2020-07-25 (×3): 6 via TOPICAL

## 2020-07-23 MED ORDER — PROPOFOL 10 MG/ML IV BOLUS
INTRAVENOUS | Status: AC
  Filled 2020-07-23: qty 20

## 2020-07-23 MED ORDER — ONDANSETRON HCL 4 MG/2ML IJ SOLN
INTRAMUSCULAR | Status: DC | PRN
  Administered 2020-07-23: 4 mg via INTRAVENOUS

## 2020-07-23 MED ORDER — LACTATED RINGERS IV SOLN: INTRAVENOUS | Status: DC | PRN

## 2020-07-23 MED ORDER — ROCURONIUM BROMIDE 10 MG/ML (PF) SYRINGE
PREFILLED_SYRINGE | INTRAVENOUS | Status: AC
  Filled 2020-07-23: qty 10

## 2020-07-23 MED ORDER — LIDOCAINE 2% (20 MG/ML) 5 ML SYRINGE
INTRAMUSCULAR | Status: AC
  Filled 2020-07-23: qty 5

## 2020-07-23 MED ORDER — IOHEXOL 300 MG/ML  SOLN
INTRAMUSCULAR | Status: DC | PRN
  Administered 2020-07-23: 16 mL

## 2020-07-23 MED ORDER — LIDOCAINE 2% (20 MG/ML) 5 ML SYRINGE
INTRAMUSCULAR | Status: DC | PRN
  Administered 2020-07-23: 40 mg via INTRAVENOUS

## 2020-07-23 MED ORDER — SUGAMMADEX SODIUM 200 MG/2ML IV SOLN
INTRAVENOUS | Status: DC | PRN
  Administered 2020-07-23: 200 mg via INTRAVENOUS

## 2020-07-23 MED ORDER — SODIUM CHLORIDE 0.9 % IR SOLN
Status: DC | PRN
  Administered 2020-07-23: 6000 mL

## 2020-07-23 MED ORDER — FENTANYL CITRATE (PF) 100 MCG/2ML IJ SOLN
INTRAMUSCULAR | Status: AC
  Filled 2020-07-23: qty 2

## 2020-07-23 MED ORDER — STERILE WATER FOR IRRIGATION IR SOLN
Status: DC | PRN
  Administered 2020-07-23: 1000 mL

## 2020-07-23 MED ORDER — ONDANSETRON HCL 4 MG/2ML IJ SOLN
INTRAMUSCULAR | Status: AC
  Filled 2020-07-23: qty 2

## 2020-07-23 MED ORDER — ROCURONIUM BROMIDE 10 MG/ML (PF) SYRINGE
PREFILLED_SYRINGE | INTRAVENOUS | Status: DC | PRN
  Administered 2020-07-23: 60 mg via INTRAVENOUS

## 2020-07-23 MED ORDER — PROPOFOL 10 MG/ML IV BOLUS
INTRAVENOUS | Status: DC | PRN
  Administered 2020-07-23: 140 mg via INTRAVENOUS

## 2020-07-23 MED ORDER — FENTANYL CITRATE (PF) 100 MCG/2ML IJ SOLN
INTRAMUSCULAR | Status: DC | PRN
Start: 2020-07-23 — End: 2020-07-23
  Administered 2020-07-23 (×3): 50 ug via INTRAVENOUS

## 2020-07-23 MED ORDER — CEFAZOLIN SODIUM-DEXTROSE 2-3 GM-%(50ML) IV SOLR
INTRAVENOUS | Status: DC | PRN
  Administered 2020-07-23: 2 g via INTRAVENOUS

## 2020-07-23 MED ORDER — CEFAZOLIN SODIUM-DEXTROSE 2-4 GM/100ML-% IV SOLN
INTRAVENOUS | Status: AC
  Filled 2020-07-23: qty 100

## 2020-07-23 SURGICAL SUPPLY — 28 items
BAG URINE DRAIN 2000ML AR STRL (UROLOGICAL SUPPLIES) ×4 IMPLANT
BAG URO CATCHER STRL LF (MISCELLANEOUS) ×4 IMPLANT
BALLN NEPHROSTOMY (BALLOONS) ×4
BALLOON NEPHROSTOMY (BALLOONS) ×2 IMPLANT
CATH FOLEY 2W COUNCIL 20FR 5CC (CATHETERS) ×4 IMPLANT
CATH FOLEY 2WAY SLVR  5CC 18FR (CATHETERS)
CATH FOLEY 2WAY SLVR 5CC 18FR (CATHETERS) IMPLANT
CATH INTERMIT  6FR 70CM (CATHETERS) ×4 IMPLANT
CLOTH BEACON ORANGE TIMEOUT ST (SAFETY) ×4 IMPLANT
ELECT REM PT RETURN 15FT ADLT (MISCELLANEOUS) ×4 IMPLANT
GLOVE BIO SURGEON STRL SZ7.5 (GLOVE) ×4 IMPLANT
GOWN STRL REUS W/TWL LRG LVL3 (GOWN DISPOSABLE) ×8 IMPLANT
GOWN STRL REUS W/TWL XL LVL3 (GOWN DISPOSABLE) ×4 IMPLANT
GUIDEWIRE STR DUAL SENSOR (WIRE) ×4 IMPLANT
KIT BALLN UROMAX 15FX4 (MISCELLANEOUS) ×2 IMPLANT
KIT BALLN UROMAX 26 75X4 (MISCELLANEOUS) ×2
KIT TURNOVER KIT A (KITS) IMPLANT
LOOP CUT BIPOLAR 24F LRG (ELECTROSURGICAL) IMPLANT
MANIFOLD NEPTUNE II (INSTRUMENTS) ×4 IMPLANT
PACK CYSTO (CUSTOM PROCEDURE TRAY) ×4 IMPLANT
PENCIL SMOKE EVACUATOR (MISCELLANEOUS) IMPLANT
PLUG CATH AND CAP STER (CATHETERS) IMPLANT
STENT URET 6FRX24 CONTOUR (STENTS) ×4 IMPLANT
SYR TOOMEY IRRIG 70ML (MISCELLANEOUS)
SYRINGE TOOMEY IRRIG 70ML (MISCELLANEOUS) IMPLANT
TUBING CONNECTING 10 (TUBING) ×3 IMPLANT
TUBING CONNECTING 10' (TUBING) ×1
TUBING UROLOGY SET (TUBING) ×4 IMPLANT

## 2020-07-23 NOTE — Transfer of Care (Signed)
Immediate Anesthesia Transfer of Care Note  Patient: Douglas Fowler  Procedure(s) Performed: CYSTOSCOPY WITH RIGHT RETROGRADE AND STENT PLACEMENT, AND URETHRAL DILATION (Right Ureter)  Patient Location: PACU  Anesthesia Type:General  Level of Consciousness: awake, alert , oriented and patient cooperative  Airway & Oxygen Therapy: Patient Spontanous Breathing and Patient connected to face mask oxygen  Post-op Assessment: Report given to RN, Post -op Vital signs reviewed and stable and Patient moving all extremities  Post vital signs: Reviewed and stable  Last Vitals:  Vitals Value Taken Time  BP    Temp    Pulse 82 07/23/20 0956  Resp 14 07/23/20 0956  SpO2 100 % 07/23/20 0956  Vitals shown include unvalidated device data.  Last Pain:  Vitals:   07/23/20 0800  TempSrc:   PainSc: Asleep         Complications: No complications documented.

## 2020-07-23 NOTE — Anesthesia Procedure Notes (Signed)
Procedure Name: Intubation Date/Time: 07/23/2020 8:56 AM Performed by: Victoriano Lain, CRNA Pre-anesthesia Checklist: Emergency Drugs available, Patient identified, Suction available, Patient being monitored and Timeout performed Patient Re-evaluated:Patient Re-evaluated prior to induction Oxygen Delivery Method: Circle system utilized Preoxygenation: Pre-oxygenation with 100% oxygen Induction Type: IV induction Ventilation: Mask ventilation without difficulty Laryngoscope Size: Mac and 4 Grade View: Grade I Tube type: Oral Tube size: 7.5 mm Number of attempts: 1 Airway Equipment and Method: Stylet Placement Confirmation: ETT inserted through vocal cords under direct vision,  positive ETCO2 and breath sounds checked- equal and bilateral Secured at: 22 cm Tube secured with: Tape Dental Injury: Teeth and Oropharynx as per pre-operative assessment

## 2020-07-23 NOTE — Progress Notes (Signed)
Patient taken to OR. No procedure orders placed, OR RNs aware consent was not obtained. Patient oriented to place (hospital only), person and time, but did not remember why he was here or that he was to have surgery, Wife Enzo Bi) contacted prior to going to OR to speak to patient. OR RN aware they may need to obtain consent from wife also.

## 2020-07-23 NOTE — Consult Note (Signed)
H&P Physician requesting consult: Ava Swayze  Chief Complaint: Right hydronephrosis  History of Present Illness: 54 year old male with a history of stroke and therefore unable to provide adequate history and unable to consent for himself.  His wife provides history.  He had a prostatectomy and left-sided nephrectomy about 10 years ago.  He presented with mild acute renal insufficiency as well as urinary frequency and urgency.  He had a CT of the abdomen and pelvis that revealed sclerotic lesions throughout the spine, thoracic cage, bony pelvis, proximal femurs consistent with metastatic prostate cancer.  He also had diffuse lymphadenopathy.  There was evidence of locally advanced tumor in the bladder.  Lastly, there was right-sided hydroureteronephrosis consistent with moderate obstruction.  Patient today has no complaints but provides minimal history himself due to the above.  According to his wife, he has not been followed for his prostate cancer.  PSA has been ordered.  Past Medical History:  Diagnosis Date  . CAD (coronary artery disease)    s/p PCI with stents x 3  . Chronic alcohol abuse   . CVA (cerebral vascular accident) ([place])    multiple  . Hypertension   . Seizures ([place])   . Tobacco abuse    Past Surgical History:  Procedure Laterality Date  . CORONARY ANGIOPLASTY WITH STENT PLACEMENT     stents x 3    Home Medications:  Medications Prior to Admission  Medication Sig Dispense Refill Last Dose  . amLODipine (NORVASC) 10 MG tablet Take 10 mg by mouth daily.   07/21/2020 at Unknown time  . atorvastatin (LIPITOR) 80 MG tablet Take 1 tablet (80 mg total) by mouth daily at 6 PM. 30 tablet 0 07/21/2020 at Unknown time  . clopidogrel (PLAVIX) 75 MG tablet Take 1 tablet (75 mg total) by mouth daily.   07/21/2020 at Unknown time  . divalproex (DEPAKOTE) 500 MG DR tablet Take 500 mg by mouth 2 (two) times daily.   07/21/2020 at Unknown time  . levETIRAcetam (KEPPRA) 500 MG tablet Take 1  tablet (500 mg total) by mouth 2 (two) times daily. 60 tablet 0 07/21/2020 at Unknown time  . Multiple Vitamins-Minerals (ONE-A-DAY 50 PLUS PO) Take 1 tablet by mouth daily.   07/21/2020 at Unknown time  . Omega-3 Fatty Acids (FISH OIL) 1000 MG CAPS Take 1,000 mg by mouth daily.   07/21/2020 at Unknown time  . ondansetron (ZOFRAN-ODT) 8 MG disintegrating tablet Take 8 mg by mouth 3 (three) times daily.   07/21/2020 at Unknown time  . risperiDONE (RISPERDAL) 1 MG tablet Take 1 tablet (1 mg total) by mouth 2 (two) times daily. 60 tablet 0 07/21/2020 at Unknown time  . traZODone (DESYREL) 50 MG tablet Take 1 tablet (50 mg total) by mouth at bedtime.   07/21/2020 at Unknown time  . aspirin EC 81 MG EC tablet Take 1 tablet (81 mg total) by mouth daily. (Patient not taking: Reported on 05/12/2020)   Not Taking at Unknown time  . buPROPion (WELLBUTRIN SR) 150 MG 12 hr tablet Take 150 mg by mouth 2 (two) times daily. (Patient not taking: Reported on 05/12/2020)   Not Taking at Unknown time  . folic acid (FOLVITE) 1 MG tablet Take 1 tablet (1 mg total) by mouth daily. (Patient not taking: Reported on 05/12/2020) 30 tablet 0 Not Taking at Unknown time  . multivitamin (PROSIGHT) TABS tablet Take 1 tablet by mouth daily. (Patient not taking: Reported on 02/28/2019) 30 each 0   . thiamine 100  MG tablet Take 1 tablet (100 mg total) by mouth daily. (Patient not taking: Reported on 02/28/2019) 30 tablet 0 Not Taking at Unknown time   Allergies: No Known Allergies  Family History  Family history unknown: Yes   Social History:  reports that he has been smoking. He has a 5.00 pack-year smoking history. He has never used smokeless tobacco. He reports previous alcohol use. He reports current drug use. Drug: Marijuana.  ROS: A complete review of systems was performed.  All systems are negative except for pertinent findings as noted. ROS   Physical Exam:  Vital signs in last 24 hours: Temp:  [97.3 F (36.3 C)-97.9 F (36.6  C)] 97.6 F (36.4 C) (08/22 0623) Pulse Rate:  [61-93] 61 (08/22 0623) Resp:  [15-22] 16 (08/22 6378) BP: (127-174)/(68-109) 162/83 (08/22 5885) SpO2:  [94 %-99 %] 95 % (08/22 0277) Weight:  [90.6 kg-95.3 kg] 90.6 kg (08/21 2127) General:  Alert and in no acute distress HEENT: Normocephalic, atraumatic Neck: No JVD or lymphadenopathy Cardiovascular: Regular rate and rhythm Lungs: Regular rate and effort Abdomen: Soft, nontender, nondistended, no abdominal masses Back: No CVA tenderness Extremities: No edema Neurologic: Grossly intact  Laboratory Data:  Results for orders placed or performed during the hospital encounter of 07/22/20 (from the past 24 hour(s))  Urinalysis, Routine w reflex microscopic Urine, Clean Catch     Status: Abnormal   Collection Time: 07/22/20 12:28 PM  Result Value Ref Range   Color, Urine STRAW (A) YELLOW   APPearance CLEAR CLEAR   Specific Gravity, Urine 1.011 1.005 - 1.030   pH 7.0 5.0 - 8.0   Glucose, UA NEGATIVE NEGATIVE mg/dL   Hgb urine dipstick NEGATIVE NEGATIVE   Bilirubin Urine NEGATIVE NEGATIVE   Ketones, ur NEGATIVE NEGATIVE mg/dL   Protein, ur NEGATIVE NEGATIVE mg/dL   Nitrite NEGATIVE NEGATIVE   Leukocytes,Ua NEGATIVE NEGATIVE  CBC with Differential     Status: Abnormal   Collection Time: 07/22/20  1:38 PM  Result Value Ref Range   WBC 8.7 4.0 - 10.5 K/uL   RBC 4.18 (L) 4.22 - 5.81 MIL/uL   Hemoglobin 11.9 (L) 13.0 - 17.0 g/dL   HCT 36.9 (L) 39 - 52 %   MCV 88.3 80.0 - 100.0 fL   MCH 28.5 26.0 - 34.0 pg   MCHC 32.2 30.0 - 36.0 g/dL   RDW 14.8 11.5 - 15.5 %   Platelets 247 150 - 400 K/uL   nRBC 0.0 0.0 - 0.2 %   Neutrophils Relative % 62 %   Neutro Abs 5.4 1.7 - 7.7 K/uL   Lymphocytes Relative 23 %   Lymphs Abs 2.0 0.7 - 4.0 K/uL   Monocytes Relative 8 %   Monocytes Absolute 0.7 0 - 1 K/uL   Eosinophils Relative 5 %   Eosinophils Absolute 0.4 0 - 0 K/uL   Basophils Relative 1 %   Basophils Absolute 0.1 0 - 0 K/uL   Immature  Granulocytes 1 %   Abs Immature Granulocytes 0.05 0.00 - 0.07 K/uL  Basic metabolic panel     Status: Abnormal   Collection Time: 07/22/20  1:38 PM  Result Value Ref Range   Sodium 142 135 - 145 mmol/L   Potassium 4.7 3.5 - 5.1 mmol/L   Chloride 105 98 - 111 mmol/L   CO2 21 (L) 22 - 32 mmol/L   Glucose, Bld 157 (H) 70 - 99 mg/dL   BUN 29 (H) 6 - 20 mg/dL   Creatinine, Ser  1.99 (H) 0.61 - 1.24 mg/dL   Calcium 8.7 (L) 8.9 - 10.3 mg/dL   GFR calc non Af Amer 36 (L) >60 mL/min   GFR calc Af Amer 42 (L) >60 mL/min   Anion gap 16 (H) 5 - 15  SARS Coronavirus 2 by RT PCR (hospital order, performed in [place] hospital lab) Nasopharyngeal Nasopharyngeal Swab     Status: None   Collection Time: 07/22/20  5:04 PM   Specimen: Nasopharyngeal Swab  Result Value Ref Range   SARS Coronavirus 2 NEGATIVE NEGATIVE  Basic metabolic panel     Status: Abnormal   Collection Time: 07/23/20  6:15 AM  Result Value Ref Range   Sodium 139 135 - 145 mmol/L   Potassium 3.9 3.5 - 5.1 mmol/L   Chloride 108 98 - 111 mmol/L   CO2 23 22 - 32 mmol/L   Glucose, Bld 116 (H) 70 - 99 mg/dL   BUN 27 (H) 6 - 20 mg/dL   Creatinine, Ser 1.64 (H) 0.61 - 1.24 mg/dL   Calcium 8.4 (L) 8.9 - 10.3 mg/dL   GFR calc non Af Amer 46 (L) >60 mL/min   GFR calc Af Amer 53 (L) >60 mL/min   Anion gap 8 5 - 15  CBC     Status: Abnormal   Collection Time: 07/23/20  6:15 AM  Result Value Ref Range   WBC 6.4 4.0 - 10.5 K/uL   RBC 3.63 (L) 4.22 - 5.81 MIL/uL   Hemoglobin 10.3 (L) 13.0 - 17.0 g/dL   HCT 32.0 (L) 39 - 52 %   MCV 88.2 80.0 - 100.0 fL   MCH 28.4 26.0 - 34.0 pg   MCHC 32.2 30.0 - 36.0 g/dL   RDW 14.6 11.5 - 15.5 %   Platelets 203 150 - 400 K/uL   nRBC 0.0 0.0 - 0.2 %   Recent Results (from the past 240 hour(s))  SARS Coronavirus 2 by RT PCR (hospital order, performed in [place] hospital lab) Nasopharyngeal Nasopharyngeal Swab     Status: None   Collection Time: 07/22/20  5:04 PM   Specimen: Nasopharyngeal  Swab  Result Value Ref Range Status   SARS Coronavirus 2 NEGATIVE NEGATIVE Final    Comment: (NOTE) SARS-CoV-2 target nucleic acids are NOT DETECTED.  The SARS-CoV-2 RNA is generally detectable in upper and lower respiratory specimens during the acute phase of infection. The lowest concentration of SARS-CoV-2 viral copies this assay can detect is 250 copies / mL. A negative result does not preclude SARS-CoV-2 infection and should not be used as the sole basis for treatment or other patient management decisions.  A negative result may occur with improper specimen collection / handling, submission of specimen other than nasopharyngeal swab, presence of viral mutation(s) within the areas targeted by this assay, and inadequate number of viral copies (<250 copies / mL). A negative result must be combined with clinical observations, patient history, and epidemiological information.  Fact Sheet for Patients:   StrictlyIdeas.no  Fact Sheet for Healthcare Providers: BankingDealers.co.za  This test is not yet approved or  cleared by the [place] FDA and has been authorized for detection and/or diagnosis of SARS-CoV-2 by FDA under an Emergency Use Authorization (EUA).  This EUA will remain in effect (meaning this test can be used) for the duration of the COVID-19 declaration under Section 564(b)(1) of the Act, 21 U.S.C. section 360bbb-3(b)(1), unless the authorization is terminated or revoked sooner.  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  Risk and benefits discussed with his wife who provides consent..  I did discuss that there is a good likelihood I will not be able to place a retrograde stent and therefore he would need a nephrostomy tube placed tomorrow.  She expressed understanding.  In regards to his prostate cancer, obtain PSA.  He would likely need to initiate androgen deprivation therapy.  Also discussed the option of bilateral orchiectomy in the future with his wife.  We will also get him set up with our advanced prostate cancer clinic.  Marton Redwood, III 07/23/2020, 8:25 AM

## 2020-07-23 NOTE — Progress Notes (Signed)
PROGRESS NOTE  Douglas Fowler QIO:[id number] DOB: 1966/01/27 DOA: 07/22/2020 PCP: Curly Rim, MD  Brief History   The patient is a 54 yr old man who is unable to participate in history due to a previous stroke. The patient has been needing to urinate frequently overnight.   He carries a past medical history significant for nephrectomy due to prostate cancer, seizure disorder, HTN, CAD, NSTEMI, AKI, alcohol use disorder, dementia, nicotine dependence, insomnia, and a stroke.   In the ED the patient was found to have an elevation of his creatinine at 1.99 above his baseline of 1.3-1.4. CT of his abdomen and pelvis demonstrated: 1. Findings most consistent with metastatic prostate cancer. There is soft tissue nodularity at the base of the bladder, as well as multiple enlarged lymph nodes throughout the retroperitoneum. Diffuse skeletal sclerotic metastases are noted. 2. High-grade right-sided obstructive uropathy without evidence of urinary tract calculus. The obstruction is likely due to the soft tissue mass is seen at the base of the bladder due to local recurrence of prostate cancer. However, cystoscopy could be performed to assess for uroepithelial process as well as allowed for decompression and stent placement. 3. Aortic Atherosclerosis (ICD10-I70.0).  Dr. Gloriann Loan of Urology was consulted. He expressed concern that this could represent recurrence of prostate cancer.  [facility] were consulted to admit the patient for further evaluation and treatment. The patient will be admitted and he will be kept NPO overnight for an anticipated procedure tomorrow.  This morning the patient went for cystoscopy with balloon dilatation of bladder neck contracture, and right retrograde pyelogram with right ureteral stent placement. He has been found to have recurrent prostate cancer with locally advanced growth in the bladder and metastatic lymphadenopathy and bone metastasis.    Consultants  . Urology  Procedures  . Cystoscopy with balloon dilatation  Antibiotics   Anti-infectives (From admission, onward)   Start     Dose/Rate Route Frequency Ordered Stop   07/23/20 0930  ceFAZolin (ANCEF) IVPB 2g/100 mL premix  Status:  Discontinued        2 g 200 mL/hr over 30 Minutes Intravenous On call to O.R. 07/23/20 0843 07/23/20 1053   07/23/20 0840  ceFAZolin (ANCEF) 2-4 GM/100ML-% IVPB       Note to Pharmacy: Carleene Cooper   : cabinet override      07/23/20 0840 07/23/20 2044    .  Subjective  The patient is resting comfortably. No new complaints.  Objective   Vitals:  Vitals:   07/23/20 1240 07/23/20 1429  BP: (!) 151/81 119/85  Pulse: 70 75  Resp:  19  Temp:  97.9 F (36.6 C)  SpO2:  95%   General appearance: alert, cooperative and no distress Head: Normocephalic, without obvious abnormality, atraumatic Resp: No increased work of breathing. No wheezes, rales, or rhonchi. No tactile fremitus. Chest wall: no tenderness Cardio: regular rate and rhythm, S1, S2 normal, no murmur, click, rub or gallop GI: soft, non-tender; bowel sounds normal; no masses,  no organomegaly Extremities: extremities normal, atraumatic, no cyanosis or edema Pulses: 2+ and symmetric Skin: Skin color, texture, turgor normal. No rashes or lesions Lymph nodes: Cervical, supraclavicular, and axillary nodes normal. Neurologic: Grossly normal  I have personally reviewed the following:   Today's Data  . Vitals, BMP, CBC, PSA is 365.81  Micro Data  . CT Renal Stone Study  Scheduled Meds: . Chlorhexidine Gluconate Cloth  6 each Topical Daily  . divalproex  500 mg Oral BID  .  levETIRAcetam  500 mg Oral BID  . multivitamin with minerals  1 tablet Oral Daily  . risperiDONE  1 mg Oral BID  . traZODone  50 mg Oral QHS   Continuous Infusions: . ceFAZolin      Principal Problem:   Acute urinary obstruction Active Problems:   Essential hypertension   CAD (coronary  artery disease)   Tobacco abuse   AKI (acute kidney injury) ([place])   Chronic alcohol abuse   NSTEMI (non-ST elevated myocardial infarction) ([place])   LOS: 1 day   A & P  Acute urinary obstruction: Urology has taken the patient for cystoscopy with balloon dilatation of bladder neck contracture and right retrograde pyelogram with right ureteral stent placement. Obstruction was due to recurrent prostate cancer with locally advanced growth in the bladder and metastatic lymphadenopathy and bone metastasis. The patient will need to keep his Foley catheter for a week and then undergo a voiding trial. PSA is pending. Dr. Gloriann Loan has offered bilateral orchiectomy as an option to [place] for androgen deprivation therapy. He will probably start [place] in the hospital after Urology has seen the patient's PSA. Given his stroke and functional status the patient may not be a candidate for additional treatment to ADT.  CAD/History of NSTEMI: Noted. The patient will be monitored on telemetry. Plavix held due to procedure tomorrow.  AKI: Due to obstruction. Continue IV fluids. Monitor electrolytes, creatinine and volume status.  Essential Hypertension: Monitor. The patient is currently normotensive. Coreg recently stopped due to hypotension and dizziness.  Chronic alcohol abuse: CIWA protocol.  Seizure Disorder: Continue Keppra as at home.  Tobacco abuse: Nicotine patch.  Dementia: Noted.   I have seen and examined this patient myself. I have spent 76 minutes in his evaluation and care.  DVT Prophylaxis: SCD's CODE STATUS: Full Code Family Communication: Spouse is at bedside Disposition: Status is: Inpatient  Remains inpatient appropriate because:Inpatient level of care appropriate due to severity of illness  Dispo: The patient is from: Home  Anticipated d/c is to: Home  Anticipated d/c date is: 2 days  Patient currently is not medically stable to  d/c.  Mason Burleigh, DO [facility] Direct contact: see [email or web address]  7PM-7AM contact night coverage as above 07/23/2020, 3:17 PM  LOS: 1 day

## 2020-07-23 NOTE — Progress Notes (Signed)
Patient legally blind, confused at times, unable to remember to call RN when needing assistance. Wife has called RN both times pt's FC has needed to be hand irrigated. Wife requesting to stay at bedside at all times to assist patient with communication and needs. CN and AC Jeff aware and gave approval for wife to stay the night.

## 2020-07-23 NOTE — Progress Notes (Signed)
At 1230, patient c/o sudden pain/pressure to bladder rated 7 out of 10. FC was draining thick, dark red. FC was hand irrigated using around 180 ml sterile water. Many small and large blood clots were removed. Pt experienced immediate relief. This was repeated at 1315 d/t sudden pain again.

## 2020-07-23 NOTE — Anesthesia Preprocedure Evaluation (Signed)
Anesthesia Evaluation  Patient identified by MRN, date of birth, ID band Patient awake    Reviewed: Allergy & Precautions, NPO status , Patient's Chart, lab work & pertinent test results  Airway Mallampati: III  TM Distance: >3 FB     Dental  (+) Poor Dentition, Dental Advisory Given   Pulmonary Current Smoker,    breath sounds clear to auscultation       Cardiovascular hypertension,  Rhythm:Regular Rate:Normal     Neuro/Psych    GI/Hepatic   Endo/Other    Renal/GU      Musculoskeletal   Abdominal (+) + obese,   Peds  Hematology   Anesthesia Other Findings Answers questions appropriately, no obvious neuro deficits  Reproductive/Obstetrics                             Anesthesia Physical Anesthesia Plan  ASA: III  Anesthesia Plan: General   Post-op Pain Management:    Induction: Intravenous  PONV Risk Score and Plan: Ondansetron and Dexamethasone  Airway Management Planned: Oral ETT  Additional Equipment:   Intra-op Plan:   Post-operative Plan: Extubation in OR  Informed Consent: I have reviewed the patients History and Physical, chart, labs and discussed the procedure including the risks, benefits and alternatives for the proposed anesthesia with the patient or authorized representative who has indicated his/her understanding and acceptance.     Dental advisory given  Plan Discussed with: CRNA and Anesthesiologist  Anesthesia Plan Comments:         Anesthesia Quick Evaluation

## 2020-07-23 NOTE — Anesthesia Postprocedure Evaluation (Signed)
Anesthesia Post Note  Patient: Douglas Fowler  Procedure(s) Performed: CYSTOSCOPY WITH RIGHT RETROGRADE AND STENT PLACEMENT, AND URETHRAL DILATION (Right Ureter)     Patient location during evaluation: PACU Anesthesia Type: General Level of consciousness: awake and alert Pain management: pain level controlled Vital Signs Assessment: post-procedure vital signs reviewed and stable Respiratory status: spontaneous breathing, nonlabored ventilation, respiratory function stable and patient connected to nasal cannula oxygen Cardiovascular status: blood pressure returned to baseline and stable Postop Assessment: no apparent nausea or vomiting Anesthetic complications: no   No complications documented.  Last Vitals:  Vitals:   07/23/20 1030 07/23/20 1056  BP: (!) 145/81 (!) 180/78  Pulse: 72 68  Resp: (!) 23 16  Temp:  36.4 C  SpO2: 92% 99%    Last Pain:  Vitals:   07/23/20 1056  TempSrc: Oral  PainSc:                  Filomeno Cromley COKER

## 2020-07-23 NOTE — Op Note (Signed)
Operative Note  Preoperative diagnosis:  1.  Right hydronephrosis secondary to malignant obstruction 2.  Recurrent prostate cancer with locally advanced growth in the bladder and metastatic lymphadenopathy and bone metastasis  Postoperative diagnosis: 1.  Bladder neck contracture 2. Right hydronephrosis secondary to malignant obstruction 3.  Recurrent prostate cancer with locally advanced growth in the bladder and metastatic lymphadenopathy and bone metastasis  Procedure(s): 1.  Cystoscopy with balloon dilation of bladder neck contracture 2.  Right retrograde pyelogram with right ureteral stent placement  Surgeon: Link Snuffer, MD  Assistants: None  Anesthesia: General  Complications: None immediate  EBL: Minimal  Specimens: 1.  None  Drains/Catheters: 1.  6 x 24 double-J ureteral stent 2.  20 French council tip catheter  Intraoperative findings: 1.  Normal anterior urethra 2.  Surgically absent prostate 3.  Dense 6 French bladder neck contracture dilated to 24 [place] with balloon dilator 4.  Very dense and firm bladder neck. 5.  Bladder inspection revealed nodular growth within the bladder especially on the right trigone consistent with locally advanced prostate cancer.  I was able to locate the right ureteral orifice.  Retrograde pyelogram revealed hydroureteronephrosis.  Successful placement of 6 x 24 double-J ureteral stent  Indication: 54 year old male with a reported history of prostate cancer status post prostatectomy about 10 years ago.  He has a history of stroke so is unable to provide history for himself and history is provided by his wife.  He also has a history of left-sided nephrectomy.  He presented with urinary frequency and difficulty voiding.  CT scan was performed that revealed diffuse bone metastasis and pelvic lymphadenopathy as well as locally advanced growth in the bladder consistent with likely locally advanced prostate cancer.  Given that he had a unilateral  kidney with obstruction, decision was made to proceed with right ureteral stent placement.  Description of procedure:  The patient was identified and consent was obtained.  The patient was taken to the operating room and placed in the supine position.  The patient was placed under general anesthesia.  Perioperative antibiotics were administered.  The patient was placed in dorsal lithotomy.  Patient was prepped and draped in a standard sterile fashion and a timeout was performed.  A 21 French rigid cystoscope was advanced into the urethra.  There was a dense bladder neck contracture.  Therefore, a wire was advanced through the bladder neck contracture and into the bladder.  Placement in the bladder was confirmed with fluoroscopy.  I withdrew the scope and passed a balloon dilator over the wire and under fluoroscopic guidance I dilated the urethra up to 24 [place].  I then deflated the dilator and removed it.  I advanced the 21 French cystoscope alongside the wire and it passed into the bladder.  Movement of the scope was limited by a very firm dense bladder neck.  The wire was withdrawn.  There was nodular growth in the right trigone obscuring the view of the right ureteral orifice.  However, I was able to navigate a wire into the right ureteral orifice and up the ureter.  I advanced an open-ended ureteral catheter over the wire into the distal ureter and remove the wire.  Retrograde pyelogram was performed with findings noted above.  I then readvanced the wire up the ureter and into the kidney under fluoroscopic guidance.  The open-ended ureteral catheter was withdrawn.  I then advanced a 6 x 24 double-J ureteral stent over the wire and withdrew the wire.  Fluoroscopy confirmed  good proximal placement and direct visualization confirmed a good coil within the bladder.  Please note that the distal ureter was quite dense and it was somewhat difficult to pass the stent over the wire but I was able to do so.  I then  readvanced the wire through the scope and into the bladder and withdrew the scope.  I passed a 36 [place] council tip catheter over the wire and into the bladder and remove the wire.  10 cc of sterile water was instilled into the catheter balloon.  This concluded the operation.  Patient tolerated the procedure well and was stable postoperative.  Plan: He will need to keep his Foley catheter for 1 week.  He can undergo a voiding trial at that time.  Stent will need to be exchanged about every 3 months.  Obtain PSA.  Can likely start Graham while in the hospital but I would like to see his PSA first.  Bilateral orchiectomy in the future is a potential option for him for androgen deprivation therapy versus continuing Eligard.  He will be referred over to our advanced prostate cancer clinic.  Given his stroke and functional status, we may decide to hold off on additional treatment in addition to ADT.

## 2020-07-23 NOTE — Discharge Instructions (Signed)
[facility] [phone] Post Ureteroscopy With or Without Stent Instructions  You will need your stent replaced every 3 months.  We will further discuss metastatic prostate cancer treatment in the clinic.  Definitions:  Ureter: The duct that transports urine from the kidney to the bladder. Stent:   A plastic hollow tube that is placed into the ureter, from the kidney to the                 bladder to prevent the ureter from swelling shut.  GENERAL INSTRUCTIONS:  Despite the fact that no skin incisions were used, the area around the ureter and bladder is raw and irritated. The stent is a foreign body which will further irritate the bladder wall. This irritation is manifested by increased frequency of urination, both day and night, and by an increase in the urge to urinate. In some, the urge to urinate is present almost always. Sometimes the urge is strong enough that you may not be able to stop yourself from urinating. The only real cure is to remove the stent and then give time for the bladder wall to heal which can't be done until the danger of the ureter swelling shut has passed, which varies.  You may see some blood in your urine while the stent is in place and a few days afterwards. Do not be alarmed, even if the urine was clear for a while. Get off your feet and drink lots of fluids until clearing occurs. If you start to pass clots or don't improve, call us.  DIET: You may return to your normal diet immediately. Because of the raw surface of your bladder, alcohol, spicy foods, acid type foods and drinks with caffeine may cause irritation or frequency and should be used in moderation. To keep your urine flowing freely and to avoid constipation, drink plenty of fluids during the day ( 8-10 glasses ). Tip: Avoid cranberry juice because it is very acidic.  ACTIVITY: Your physical activity doesn't need to be restricted. However, if you are very active, you may see some blood  in your urine. We suggest that you reduce your activity under these circumstances until the bleeding has stopped.  BOWELS: It is important to keep your bowels regular during the postoperative period. Straining with bowel movements can cause bleeding. A bowel movement every other day is reasonable. Use a mild laxative if needed, such as Milk of Magnesia 2-3 tablespoons, or 2 Dulcolax tablets. Call if you continue to have problems. If you have been taking narcotics for pain, before, during or after your surgery, you may be constipated. Take a laxative if necessary.   MEDICATION: You should resume your pre-surgery medications unless told not to. You may take oxybutynin or flomax if prescribed for bladder spasms or discomfort from the stent Take pain medication as directed for pain refractory to conservative management  PROBLEMS YOU SHOULD REPORT TO [place]:  Fevers over 100.5 Fahrenheit.  Heavy bleeding, or clots ( See above notes about blood in urine ).  Inability to urinate.  Drug reactions ( hives, rash, nausea, vomiting, diarrhea ).  Severe burning or pain with urination that is not improving.

## 2020-07-24 ENCOUNTER — Encounter (HOSPITAL_COMMUNITY): Payer: Self-pay | Admitting: Urology

## 2020-07-24 LAB — CBC WITH DIFFERENTIAL/PLATELET
Abs Immature Granulocytes: 0.04 10*3/uL (ref 0.00–0.07)
Basophils Absolute: 0.1 10*3/uL (ref 0.0–0.1)
Basophils Relative: 1 %
Eosinophils Absolute: 0.3 10*3/uL (ref 0.0–0.5)
Eosinophils Relative: 3 %
HCT: 33.7 % — ABNORMAL LOW (ref 39.0–52.0)
Hemoglobin: 10.6 g/dL — ABNORMAL LOW (ref 13.0–17.0)
Immature Granulocytes: 0 %
Lymphocytes Relative: 20 %
Lymphs Abs: 1.8 10*3/uL (ref 0.7–4.0)
MCH: 28.3 pg (ref 26.0–34.0)
MCHC: 31.5 g/dL (ref 30.0–36.0)
MCV: 89.9 fL (ref 80.0–100.0)
Monocytes Absolute: 1.1 10*3/uL — ABNORMAL HIGH (ref 0.1–1.0)
Monocytes Relative: 12 %
Neutro Abs: 5.8 10*3/uL (ref 1.7–7.7)
Neutrophils Relative %: 64 %
Platelets: 193 10*3/uL (ref 150–400)
RBC: 3.75 MIL/uL — ABNORMAL LOW (ref 4.22–5.81)
RDW: 14.7 % (ref 11.5–15.5)
WBC: 9.1 10*3/uL (ref 4.0–10.5)
nRBC: 0 % (ref 0.0–0.2)

## 2020-07-24 LAB — BASIC METABOLIC PANEL
Anion gap: 8 (ref 5–15)
BUN: 21 mg/dL — ABNORMAL HIGH (ref 6–20)
CO2: 23 mmol/L (ref 22–32)
Calcium: 8.2 mg/dL — ABNORMAL LOW (ref 8.9–10.3)
Chloride: 108 mmol/L (ref 98–111)
Creatinine, Ser: 1.7 mg/dL — ABNORMAL HIGH (ref 0.61–1.24)
GFR calc Af Amer: 51 mL/min — ABNORMAL LOW (ref >60)
GFR calc non Af Amer: 44 mL/min — ABNORMAL LOW (ref >60)
Glucose, Bld: 101 mg/dL — ABNORMAL HIGH (ref 70–99)
Potassium: 4.8 mmol/L (ref 3.5–5.1)
Sodium: 139 mmol/L (ref 135–145)

## 2020-07-24 LAB — URINE CULTURE: Culture: <10000 — AB

## 2020-07-24 MED ORDER — DEGARELIX ACETATE(240 MG DOSE) 120 MG/VIAL ~~LOC~~ SOLR
240.0000 mg | Freq: Once | SUBCUTANEOUS | Status: AC
  Administered 2020-07-24: 240 mg via SUBCUTANEOUS
  Filled 2020-07-24: qty 6

## 2020-07-24 NOTE — Plan of Care (Signed)
  Problem: Education: Goal: Knowledge of General Education information will improve Description Including pain rating scale, medication(s)/side effects and non-pharmacologic comfort measures Outcome: Progressing   Problem: Nutrition: Goal: Adequate nutrition will be maintained Outcome: Progressing   Problem: Pain Managment: Goal: General experience of comfort will improve Outcome: Progressing   

## 2020-07-24 NOTE — Progress Notes (Signed)
PROGRESS NOTE  ALANTE TOLAN GDJ:[id number] DOB: [birth year] DOA: 07/22/2020 PCP: Curly Rim, MD  Brief History   The patient is a 54 yr old man who is unable to participate in history due to a previous stroke. The patient has been needing to urinate frequently overnight.   He carries a past medical history significant for nephrectomy due to prostate cancer, seizure disorder, HTN, CAD, NSTEMI, AKI, alcohol use disorder, dementia, nicotine dependence, insomnia, and a stroke.   In the ED the patient was found to have an elevation of his creatinine at 1.99 above his baseline of 1.3-1.4. CT of his abdomen and pelvis demonstrated: 1. Findings most consistent with metastatic prostate cancer. There is soft tissue nodularity at the base of the bladder, as well as multiple enlarged lymph nodes throughout the retroperitoneum. Diffuse skeletal sclerotic metastases are noted. 2. High-grade right-sided obstructive uropathy without evidence of urinary tract calculus. The obstruction is likely due to the soft tissue mass is seen at the base of the bladder due to local recurrence of prostate cancer. However, cystoscopy could be performed to assess for uroepithelial process as well as allowed for decompression and stent placement. 3. Aortic Atherosclerosis (ICD10-I70.0).  Dr. Gloriann Loan of Urology was consulted. He expressed concern that this could represent recurrence of prostate cancer.  [facility] were consulted to admit the patient for further evaluation and treatment. The patient will be admitted and he will be kept NPO overnight for an anticipated procedure tomorrow.  This morning the patient went for cystoscopy with balloon dilatation of bladder neck contracture, and right retrograde pyelogram with right ureteral stent placement. He has been found to have recurrent prostate cancer with locally advanced growth in the bladder and metastatic lymphadenopathy and bone metastasis.   Consultants   . Urology  Procedures  . Cystoscopy with balloon dilatation  Antibiotics   Anti-infectives (From admission, onward)   Start     Dose/Rate Route Frequency Ordered Stop   07/23/20 0930  ceFAZolin (ANCEF) IVPB 2g/100 mL premix  Status:  Discontinued        2 g 200 mL/hr over 30 Minutes Intravenous On call to O.R. 07/23/20 0843 07/23/20 1053   07/23/20 0840  ceFAZolin (ANCEF) 2-4 GM/100ML-% IVPB       Note to Pharmacy: Carleene Cooper   : cabinet override      07/23/20 0840 07/23/20 2044     Subjective  The patient is resting comfortably. No new complaints.  Objective   Vitals:  Vitals:   07/24/20 0625 07/24/20 1456  BP: (!) 162/93 (!) 146/76  Pulse: 74 69  Resp: 18 18  Temp: 98 F (36.7 C) 98 F (36.7 C)  SpO2: 98% 95%   General appearance: alert, cooperative and no distress Head: Normocephalic, without obvious abnormality, atraumatic Resp: No increased work of breathing. No wheezes, rales, or rhonchi. No tactile fremitus. Chest wall: no tenderness Cardio: regular rate and rhythm, S1, S2 normal, no murmur, click, rub or gallop GI: soft, non-tender; bowel sounds normal; no masses,  no organomegaly Extremities: extremities normal, atraumatic, no cyanosis or edema Pulses: 2+ and symmetric Skin: Skin color, texture, turgor normal. No rashes or lesions Lymph nodes: Cervical, supraclavicular, and axillary nodes normal. Neurologic: Grossly normal  I have personally reviewed the following:   Today's Data  . Vitals, BMP, CBC, PSA is 365.81  Micro Data  . CT Renal Stone Study  Scheduled Meds: . Chlorhexidine Gluconate Cloth  6 each Topical Daily  . divalproex  500 mg  Oral BID  . levETIRAcetam  500 mg Oral BID  . multivitamin with minerals  1 tablet Oral Daily  . risperiDONE  1 mg Oral BID  . traZODone  50 mg Oral QHS   Continuous Infusions:   Principal Problem:   Acute urinary obstruction Active Problems:   Essential hypertension   CAD (coronary artery  disease)   Tobacco abuse   AKI (acute kidney injury) ([place])   Chronic alcohol abuse   NSTEMI (non-ST elevated myocardial infarction) ([place])   LOS: 2 days   A & P  Acute urinary obstruction: Urology has taken the patient for cystoscopy with balloon dilatation of bladder neck contracture and right retrograde pyelogram with right ureteral stent placement. Obstruction was due to recurrent prostate cancer with locally advanced growth in the bladder and metastatic lymphadenopathy and bone metastasis. The patient will need to keep his Foley catheter for a week and then undergo a voiding trial. PSA is pending. Dr. Gloriann Loan has offered bilateral orchiectomy as an option to Eligard for androgen deprivation therapy. He will probably start Lucky in the hospital after Urology has seen the patient's PSA. Given his stroke and functional status the patient may not be a candidate for additional treatment to ADT. Foley is being irrigated. He is still having hematuria.  CAD/History of NSTEMI: Noted. The patient will be monitored on telemetry. Plavix held due to procedure tomorrow.  AKI: Due to obstruction. Continue IV fluids. Monitor electrolytes, creatinine and volume status.  Essential Hypertension: Monitor. The patient is currently normotensive. Coreg recently stopped due to hypotension and dizziness.  Chronic alcohol abuse: CIWA protocol.  Seizure Disorder: Continue Keppra as at home.  Tobacco abuse: Nicotine patch.  Dementia: Noted.   I have seen and examined this patient myself. I have spent 40 minutes in his evaluation and care.  DVT Prophylaxis: SCD's CODE STATUS: Full Code Family Communication: Spouse is at bedside Disposition: Status is: Inpatient  Remains inpatient appropriate because:Inpatient level of care appropriate due to severity of illness  Dispo: The patient is from: Home  Anticipated d/c is to: Home  Anticipated d/c date is: 2 days   Patient currently is not medically stable to d/c.  Kinley Dozier, DO [facility] Direct contact: see [email or web address]  7PM-7AM contact night coverage as above 07/24/2020, 6:31 PM  LOS: 1 day

## 2020-07-24 NOTE — Progress Notes (Signed)
Urology Inpatient Progress Report  Urinary frequency [R35.0] Renal insufficiency [N28.9] Urinary obstruction [N13.9] Acute urinary obstruction [N13.9]  Procedure(s): CYSTOSCOPY WITH RIGHT RETROGRADE AND STENT PLACEMENT, AND URETHRAL DILATION  1 Day Post-Op   Intv/Subj: No acute events overnight. Patient is without complaint. He is resting comfortably in bed.  PSA yesterday revealed a PSA of 365.81.  Creatinine stable and he is making good urine.  Catheter had to be flushed a couple of times for clots yesterday.  Principal Problem:   Acute urinary obstruction Active Problems:   Essential hypertension   CAD (coronary artery disease)   Tobacco abuse   AKI (acute kidney injury) ([place])   Chronic alcohol abuse   NSTEMI (non-ST elevated myocardial infarction) ([place])  Current Facility-Administered Medications  Medication Dose Route Frequency Provider Last Rate Last Admin  . Chlorhexidine Gluconate Cloth 2 % PADS 6 each  6 each Topical Daily Swayze, Ava, DO   6 each at 07/24/20 0924  . degarelix [facility]) injection 240 mg  240 mg Subcutaneous Once Marton Redwood III, MD      . divalproex (DEPAKOTE) DR tablet 500 mg  500 mg Oral BID Swayze, Ava, DO   500 mg at 07/24/20 0924  . HYDROcodone-acetaminophen (NORCO/VICODIN) 5-325 MG per tablet 1-2 tablet  1-2 tablet Oral Q4H PRN Swayze, Ava, DO   1 tablet at 07/23/20 1234  . levETIRAcetam (KEPPRA) tablet 500 mg  500 mg Oral BID Swayze, Ava, DO   500 mg at 07/24/20 0924  . multivitamin with minerals tablet 1 tablet  1 tablet Oral Daily Swayze, Ava, DO   1 tablet at 07/24/20 0924  . promethazine (PHENERGAN) tablet 12.5 mg  12.5 mg Oral Q6H PRN Swayze, Ava, DO      . risperiDONE (RISPERDAL) tablet 1 mg  1 mg Oral BID Swayze, Ava, DO   1 mg at 07/24/20 0924  . traZODone (DESYREL) tablet 50 mg  50 mg Oral QHS Swayze, Ava, DO   50 mg at 07/23/20 2056     Objective: Vital: Vitals:   07/23/20 1429 07/23/20 1828 07/23/20 2307 07/24/20 0625  BP:  119/85 (!) 155/81 120/62 (!) 162/93  Pulse: 75 72 67 74  Resp: 19 20 14 18   Temp: 97.9 F (36.6 C) 97.7 F (36.5 C) 98.2 F (36.8 C) 98 F (36.7 C)  TempSrc:  Oral Oral Oral  SpO2: 95% 97% 96% 98%  Weight:      Height:       I/Os: I/O last 3 completed shifts: In: 764 [I.V.:714; IV Piggyback:50] Out: 1400 [Urine:1400]  Physical Exam:  General: Patient is in no apparent distress Lungs: Normal respiratory effort, chest expands symmetrically. GI: The abdomen is soft and nontender without mass. Foley: 20 French Foley catheter in place draining dark red urine Ext: lower extremities symmetric  Lab Results: Recent Labs    07/22/20 1338 07/23/20 0615 07/24/20 0320  WBC 8.7 6.4 9.1  HGB 11.9* 10.3* 10.6*  HCT 36.9* 32.0* 33.7*   Recent Labs    07/22/20 1338 07/23/20 0615 07/24/20 0320  NA 142 139 139  K 4.7 3.9 4.8  CL 105 108 108  CO2 21* 23 23  GLUCOSE 157* 116* 101*  BUN 29* 27* 21*  CREATININE 1.99* 1.64* 1.70*  CALCIUM 8.7* 8.4* 8.2*   No results for input(s): LABPT, INR in the last 72 hours. No results for input(s): LABURIN in the last 72 hours. Results for orders placed or performed during the hospital encounter of 07/22/20  Urine  culture     Status: Abnormal   Collection Time: 07/22/20  1:23 PM   Specimen: Urine, Random  Result Value Ref Range Status   Specimen Description   Final    URINE, RANDOM Performed at [facility] [address] [place], [place] [address]    Special Requests   Final    NONE Performed at [facility], [place] [address], [place], [place] [address]    Culture (A)  Final    <10,000 COLONIES/mL INSIGNIFICANT GROWTH Performed at [facility] [address], [place], [place] [address]    Report Status 07/24/2020 FINAL  Final  SARS Coronavirus 2 by RT PCR (hospital order, performed in [facility] hospital lab) Nasopharyngeal Nasopharyngeal Swab     Status: None   Collection Time: 07/22/20   5:04 PM   Specimen: Nasopharyngeal Swab  Result Value Ref Range Status   SARS Coronavirus 2 NEGATIVE NEGATIVE Final    Comment: (NOTE) SARS-CoV-2 target nucleic acids are NOT DETECTED.  The SARS-CoV-2 RNA is generally detectable in upper and lower respiratory specimens during the acute phase of infection. The lowest concentration of SARS-CoV-2 viral copies this assay can detect is 250 copies / mL. A negative result does not preclude SARS-CoV-2 infection and should not be used as the sole basis for treatment or other patient management decisions.  A negative result may occur with improper specimen collection / handling, submission of specimen other than nasopharyngeal swab, presence of viral mutation(s) within the areas targeted by this assay, and inadequate number of viral copies (<250 copies / mL). A negative result must be combined with clinical observations, patient history, and epidemiological information.  Fact Sheet for Patients:   StrictlyIdeas.no  Fact Sheet for Healthcare Providers: BankingDealers.co.za  This test is not yet approved or  cleared by the [place] FDA and has been authorized for detection and/or diagnosis of SARS-CoV-2 by FDA under an Emergency Use Authorization (EUA).  This EUA will remain in effect (meaning this test can be used) for the duration of the COVID-19 declaration under Section 564(b)(1) of the Act, 21 U.S.C. section 360bbb-3(b)(1), unless the authorization is terminated or revoked sooner.  Performed at [facility], [place] [address] [place], [place] [address]     Studies/Results: DG C-Arm 1-60 Min-No Report  Result Date: 07/23/2020 Fluoroscopy was utilized by the requesting physician.  No radiographic interpretation.   CT Renal Stone Study  Result Date: 07/22/2020 CLINICAL DATA:  Left renal cell carcinoma status post nephrectomy, lethargy, right flank pain EXAM: CT  ABDOMEN AND PELVIS WITHOUT CONTRAST TECHNIQUE: Multidetector CT imaging of the abdomen and pelvis was performed following the standard protocol without IV contrast. COMPARISON:  02/18/2018 FINDINGS: Lower chest: Hypoventilatory changes are seen at the lung bases. No acute pleural or parenchymal lung disease. Hepatobiliary: No focal liver abnormality is seen. No gallstones, gallbladder wall thickening, or biliary dilatation. Pancreas: Unremarkable. No pancreatic ductal dilatation or surrounding inflammatory changes. Spleen: Normal in size without focal abnormality. Adrenals/Urinary Tract: Left kidney is surgically absent. Left adrenal has also likely been surgically removed. There is high-grade right-sided obstructive uropathy without evidence of radiopaque calculus. Dilated ureter extends to the right UVJ. There is eccentric bladder wall thickening near the right UVJ, though evaluation of the bladder is limited due to under distension. Cystoscopy is recommended for further evaluation. Stomach/Bowel: No bowel obstruction or ileus. Normal appendix right lower quadrant. No bowel wall thickening or inflammatory change. Vascular/Lymphatic: Extensive atherosclerosis is seen  throughout the aorta and its distal branches. There are abnormal lymph nodes throughout the retroperitoneum, increased in number. Largest anterior to the right crus of the diaphragm image 23 measures 11 mm in short axis. Lymphadenopathy extends along the right pelvic sidewall, largest node along the right iliac chain image 65 measuring 15 mm in short axis. Findings favor metastatic disease. There is mild fat stranding in the retroperitoneum and along the right pelvic sidewall. Reproductive: Prostate is surgically absent. Other: There is no free fluid or free gas. No abdominal wall hernia. Musculoskeletal: Sclerotic lesions are seen throughout the spine, thoracic cage, bony pelvis, and proximal femurs consistent with diffuse bony metastases. No acute  displaced fracture. Reconstructed images demonstrate no additional findings. IMPRESSION: 1. Findings most consistent with metastatic prostate cancer. There is soft tissue nodularity at the base of the bladder, as well as multiple enlarged lymph nodes throughout the retroperitoneum. Diffuse skeletal sclerotic metastases are noted. 2. High-grade right-sided obstructive uropathy without evidence of urinary tract calculus. The obstruction is likely due to the soft tissue mass is seen at the base of the bladder due to local recurrence of prostate cancer. However, cystoscopy could be performed to assess for uroepithelial process as well as allowed for decompression and stent placement. 3.  Aortic Atherosclerosis (ICD10-I70.0). Electronically Signed   By: Randa Ngo M.D.   On: 07/22/2020 15:33    Assessment: Metastatic prostate cancer Secondary bone metastasis Secondary malignant pelvic lymphadenopathy Hydronephrosis secondary to malignant extrinsic compression Bladder neck contracture  Procedure(s): CYSTOSCOPY WITH RIGHT RETROGRADE AND STENT PLACEMENT, AND URETHRAL DILATION, 1 Day Post-Op  doing well.  Plan: Continue Foley catheter.  I put an order for nursing to irrigate the catheter every 4 hours until clear.  Urine should clear with time.  Expect to have some intermittent gross hematuria with the stent in place.  He will need to keep the catheter for a total of 1 week.  I will arrange for outpatient voiding trial.  I ordered Firmagon.  I discussed androgen deprivation therapy with him and his wife.  He will need a 63-month Lupron in 1 month.   Link Snuffer, MD Urology 07/24/2020, 1:40 PM

## 2020-07-25 MED ORDER — HYDROCODONE-ACETAMINOPHEN 5-325 MG PO TABS: 1.0000 | ORAL_TABLET | ORAL | 0 refills | Status: DC | PRN

## 2020-07-25 NOTE — Progress Notes (Signed)
PROGRESS NOTE  GEOFFERY AULTMAN PPI:[id number] DOB: 22-May-1966 DOA: 07/22/2020 PCP: Curly Rim, MD  Brief History   The patient is a 54 yr old man who is unable to participate in history due to a previous stroke. The patient has been needing to urinate frequently overnight.   He carries a past medical history significant for nephrectomy due to prostate cancer, seizure disorder, HTN, CAD, NSTEMI, AKI, alcohol use disorder, dementia, nicotine dependence, insomnia, and a stroke.   In the ED the patient was found to have an elevation of his creatinine at 1.99 above his baseline of 1.3-1.4. CT of his abdomen and pelvis demonstrated: 1. Findings most consistent with metastatic prostate cancer. There is soft tissue nodularity at the base of the bladder, as well as multiple enlarged lymph nodes throughout the retroperitoneum. Diffuse skeletal sclerotic metastases are noted. 2. High-grade right-sided obstructive uropathy without evidence of urinary tract calculus. The obstruction is likely due to the soft tissue mass is seen at the base of the bladder due to local recurrence of prostate cancer. However, cystoscopy could be performed to assess for uroepithelial process as well as allowed for decompression and stent placement. 3. Aortic Atherosclerosis (ICD10-I70.0).  Dr. Gloriann Loan of Urology was consulted. He expressed concern that this could represent recurrence of prostate cancer.  [facility] were consulted to admit the patient for further evaluation and treatment. The patient will be admitted and he will be kept NPO overnight for an anticipated procedure tomorrow.  On 07/23/2020 the patient went for cystoscopy with balloon dilatation of bladder neck contracture, and right retrograde pyelogram with right ureteral stent placement. He has been found to have recurrent prostate cancer with locally advanced growth in the bladder and metastatic lymphadenopathy and bone metastasis.   Consultants   . Urology  Procedures  . Cystoscopy with balloon dilatation  Antibiotics   Anti-infectives (From admission, onward)   Start     Dose/Rate Route Frequency Ordered Stop   07/23/20 0930  ceFAZolin (ANCEF) IVPB 2g/100 mL premix  Status:  Discontinued        2 g 200 mL/hr over 30 Minutes Intravenous On call to O.R. 07/23/20 0843 07/23/20 1053   07/23/20 0840  ceFAZolin (ANCEF) 2-4 GM/100ML-% IVPB       Note to Pharmacy: Carleene Cooper   : cabinet override      07/23/20 0840 07/23/20 2044     Subjective  The patient is resting comfortably. No new complaints.  Objective   Vitals:  Vitals:   07/25/20 0643 07/25/20 1230  BP: 124/62 128/71  Pulse: 61 63  Resp: 18 12  Temp: 98.3 F (36.8 C) 98.1 F (36.7 C)  SpO2: 96% 98%   General appearance: alert, cooperative and no distress Head: Normocephalic, without obvious abnormality, atraumatic Resp: No increased work of breathing. No wheezes, rales, or rhonchi. No tactile fremitus. Chest wall: no tenderness Cardio: regular rate and rhythm, S1, S2 normal, no murmur, click, rub or gallop GI: soft, non-tender; bowel sounds normal; no masses,  no organomegaly Extremities: extremities normal, atraumatic, no cyanosis or edema Pulses: 2+ and symmetric Skin: Skin color, texture, turgor normal. No rashes or lesions Lymph nodes: Cervical, supraclavicular, and axillary nodes normal. Neurologic: Grossly normal  I have personally reviewed the following:   Today's Data  . Vitals, BMP, CBC  Micro Data  . CT Renal Stone Study  Scheduled Meds: . Chlorhexidine Gluconate Cloth  6 each Topical Daily  . divalproex  500 mg Oral BID  . levETIRAcetam  500 mg Oral BID  . multivitamin with minerals  1 tablet Oral Daily  . risperiDONE  1 mg Oral BID  . traZODone  50 mg Oral QHS   Continuous Infusions:   Principal Problem:   Acute urinary obstruction Active Problems:   Essential hypertension   CAD (coronary artery disease)   Tobacco  abuse   AKI (acute kidney injury) ([place])   Chronic alcohol abuse   NSTEMI (non-ST elevated myocardial infarction) ([place])   LOS: 3 days   A & P  Acute urinary obstruction: Urology has taken the patient for cystoscopy with balloon dilatation of bladder neck contracture and right retrograde pyelogram with right ureteral stent placement. Obstruction was due to recurrent prostate cancer with locally advanced growth in the bladder and metastatic lymphadenopathy and bone metastasis. The patient will need to keep his Foley catheter for a week and then undergo a voiding trial. PSA is pending. Dr. Gloriann Loan has offered bilateral orchiectomy as an option to Eligard for androgen deprivation therapy. He will probably start [facility] in the hospital after Urology has seen the patient's PSA. Given his stroke and functional status the patient may not be a candidate for additional treatment to ADT. Foley is being irrigated. He is still having hematuria.  CAD/History of NSTEMI: Noted. The patient will be monitored on telemetry. Plavix held due to procedure tomorrow.  AKI: Due to obstruction. Continue IV fluids. Monitor electrolytes, creatinine and volume status. Creatinine has been creeping up since cystoscopy and stent. Monitor. Avoid nephrotoxic medications and hypotension.   Essential Hypertension: Monitor. The patient is currently normotensive. Coreg recently stopped due to hypotension and dizziness.  Chronic alcohol abuse: CIWA protocol.  Seizure Disorder: Continue Keppra as at home.  Tobacco abuse: Nicotine patch.  Dementia: Noted.   I have seen and examined this patient myself. I have spent 32 minutes in his evaluation and care.  DVT Prophylaxis: SCD's CODE STATUS: Full Code Family Communication: None available. Disposition: Status is: Inpatient  Remains inpatient appropriate because:Inpatient level of care appropriate due to severity of illness  Dispo: The patient is from: Home   Anticipated d/c is to: Home  Anticipated d/c date is: 2 days  Patient currently is not medically stable to d/c.  Lajarvis Italiano, DO [facility] Direct contact: see [email or web address]  7PM-7AM contact night coverage as above 07/25/2020, 3:46 PM  LOS: 1 day

## 2020-07-25 NOTE — Progress Notes (Signed)
Urology Inpatient Progress Report  Urinary frequency [R35.0] Renal insufficiency [N28.9] Urinary obstruction [N13.9] Acute urinary obstruction [N13.9]   Procedure(s): CYSTOSCOPY WITH RIGHT RETROGRADE AND STENT PLACEMENT, AND URETHRAL DILATION  2 Days Post-Op   Intv/Subj: No acute events overnight. Patient is without complaint.  Principal Problem:   Acute urinary obstruction Active Problems:   Essential hypertension   CAD (coronary artery disease)   Tobacco abuse   AKI (acute kidney injury) ([place])   Chronic alcohol abuse   NSTEMI (non-ST elevated myocardial infarction) ([place])  Current Facility-Administered Medications  Medication Dose Route Frequency Provider Last Rate Last Admin  . Chlorhexidine Gluconate Cloth 2 % PADS 6 each  6 each Topical Daily Swayze, Ava, DO   6 each at 07/25/20 1128  . divalproex (DEPAKOTE) DR tablet 500 mg  500 mg Oral BID Swayze, Ava, DO   500 mg at 07/25/20 1127  . HYDROcodone-acetaminophen (NORCO/VICODIN) 5-325 MG per tablet 1-2 tablet  1-2 tablet Oral Q4H PRN Swayze, Ava, DO   2 tablet at 07/25/20 1127  . levETIRAcetam (KEPPRA) tablet 500 mg  500 mg Oral BID Swayze, Ava, DO   500 mg at 07/25/20 1127  . multivitamin with minerals tablet 1 tablet  1 tablet Oral Daily Swayze, Ava, DO   1 tablet at 07/25/20 1127  . promethazine (PHENERGAN) tablet 12.5 mg  12.5 mg Oral Q6H PRN Swayze, Ava, DO      . risperiDONE (RISPERDAL) tablet 1 mg  1 mg Oral BID Swayze, Ava, DO   1 mg at 07/25/20 1127  . traZODone (DESYREL) tablet 50 mg  50 mg Oral QHS Swayze, Ava, DO   50 mg at 07/24/20 2212     Objective: Vital: Vitals:   07/24/20 1456 07/24/20 2159 07/25/20 0643 07/25/20 1230  BP: (!) 146/76 (!) 168/77 124/62 128/71  Pulse: 69 73 61 63  Resp: 18 14 18 12   Temp: 98 F (36.7 C) 98.6 F (37 C) 98.3 F (36.8 C) 98.1 F (36.7 C)  TempSrc: Oral   Oral  SpO2: 95% 96% 96% 98%  Weight:      Height:       I/Os: I/O last 3 completed shifts: In: 51  [P.O.:480; Other:300] Out: 1450 [Urine:1450]  Physical Exam:  General: Patient is in no apparent distress Lungs: Normal respiratory effort, chest expands symmetrically. GI: the abdomen is soft and nontender without mass. Foley: Urine is clearing up.  Light red. Ext: lower extremities symmetric  Lab Results: Recent Labs    07/23/20 0615 07/24/20 0320  WBC 6.4 9.1  HGB 10.3* 10.6*  HCT 32.0* 33.7*   Recent Labs    07/23/20 0615 07/24/20 0320  NA 139 139  K 3.9 4.8  CL 108 108  CO2 23 23  GLUCOSE 116* 101*  BUN 27* 21*  CREATININE 1.64* 1.70*  CALCIUM 8.4* 8.2*   No results for input(s): LABPT, INR in the last 72 hours. No results for input(s): LABURIN in the last 72 hours. Results for orders placed or performed during the hospital encounter of 07/22/20  Urine culture     Status: Abnormal   Collection Time: 07/22/20  1:23 PM   Specimen: Urine, Random  Result Value Ref Range Status   Specimen Description   Final    URINE, RANDOM Performed at [facility] [address] [place],  [address]    Special Requests   Final    NONE Performed at [facility], [address] [place], [place]  [address]    Culture (A)  Final    <10,000 COLONIES/mL INSIGNIFICANT GROWTH Performed at [facility] [place] [address], [place], [place] [address]    Report Status 07/24/2020 FINAL  Final  SARS Coronavirus 2 by RT PCR (hospital order, performed in [facility] hospital lab) Nasopharyngeal Nasopharyngeal Swab     Status: None   Collection Time: 07/22/20  5:04 PM   Specimen: Nasopharyngeal Swab  Result Value Ref Range Status   SARS Coronavirus 2 NEGATIVE NEGATIVE Final    Comment: (NOTE) SARS-CoV-2 target nucleic acids are NOT DETECTED.  The SARS-CoV-2 RNA is generally detectable in upper and lower respiratory specimens during the acute phase of infection. The lowest concentration of SARS-CoV-2 viral copies this assay can detect is  250 copies / mL. A negative result does not preclude SARS-CoV-2 infection and should not be used as the sole basis for treatment or other patient management decisions.  A negative result may occur with improper specimen collection / handling, submission of specimen other than nasopharyngeal swab, presence of viral mutation(s) within the areas targeted by this assay, and inadequate number of viral copies (<250 copies / mL). A negative result must be combined with clinical observations, patient history, and epidemiological information.  Fact Sheet for Patients:   StrictlyIdeas.no  Fact Sheet for Healthcare Providers: BankingDealers.co.za  This test is not yet approved or  cleared by the [place] FDA and has been authorized for detection and/or diagnosis of SARS-CoV-2 by FDA under an Emergency Use Authorization (EUA).  This EUA will remain in effect (meaning this test can be used) for the duration of the COVID-19 declaration under Section 564(b)(1) of the Act, 21 U.S.C. section 360bbb-3(b)(1), unless the authorization is terminated or revoked sooner.  Performed at [facility], [place] [address] [place], [place] [address]     Studies/Results: No results found.  Assessment: Metastatic prostate cancer Secondary bone metastasis Secondary pelvic lymphadenopathy metastasis Acute renal insufficiency, improving, likely CKD stage III Bladder neck contracture  Procedure(s): CYSTOSCOPY WITH RIGHT RETROGRADE AND STENT PLACEMENT, AND URETHRAL DILATION, 2 Days Post-Op  doing well.  Plan: Continue Foley catheter for 1 week.  He has received [place].  He will need to follow-up in 1 week for a voiding trial.  He will also need to follow-up in about 1 month for 33-month Eligard and discussion of possible additional treatments for metastatic prostate cancer in our advanced prostate cancer clinic.  I will arrange for this.   Otherwise, okay for discharge from my standpoint.   Link Snuffer, MD Urology 07/25/2020, 5:03 PM

## 2020-07-25 NOTE — Discharge Summary (Signed)
Physician Discharge Summary  YAEL ANGERER HQP:[id number] DOB: 02/03/1966 DOA: 07/22/2020  PCP: Curly Rim, MD  Admit date: 07/22/2020 Discharge date: 07/25/2020  Recommendations for Outpatient Follow-up:  1. Discharge to home with foley catheter in place. 2. Follow up with PCP in 7-10 days. Have chemistry and CBC drawn at that visit. 3. Follow up with Urology in one week for voiding trial.   Follow-up Information    [facility].   Specialty: Emergency Medicine Why: As needed Contact information: [place] [id number] [place] [address] [phone]       Schedule an appointment as soon as possible for a visit  with Corrington, Kip A, MD.   Specialty: Family Medicine Why: For reevaluation of symptoms Contact information: [place] [address] [place] [address] [phone]                Discharge Diagnoses: Principal diagnosis is #1 1. Urinary obstruction due to recurrent metastatic prostate cancer. S/P cystoscopy and stent placement. Foley catheter left in place. 2. AKI: Baseline creatinine appears to be about 1.30 as of 05/2020. Creatinine upon admission up to 1.9 due to obstructive uropathy. Improved somewhat with foley catheter placement. Will need to be followed as outpatient. 3. Recurrent Metastatic prostate cancer.  Discharge Condition: Fair  Disposition: Home  Diet recommendation: Heart healthy  Filed Weights   07/22/20 1227 07/22/20 2127  Weight: 95.3 kg 90.6 kg    History of present illness: Complaint: Urinary frequency and weakness. HPI: The patient is a 54 yr old man who is unable to participate in history due to a previous stroke. The patient has been needing to urinate frequently overnight.   He carries a past medical history significant for nephrectomy due to prostate cancer, seizure disorder, HTN, CAD, NSTEMI, AKI, alcohol use disorder, dementia, nicotine dependence,  insomnia, and a stroke.   In the ED the patient was found to have an elevation of his creatinine at 1.99 above his baseline of 1.3-1.4. CT of his abdomen and pelvis demonstrated: 1. Findings most consistent with metastatic prostate cancer. There is soft tissue nodularity at the base of the bladder, as well as multiple enlarged lymph nodes throughout the retroperitoneum. Diffuse skeletal sclerotic metastases are noted. 2. High-grade right-sided obstructive uropathy without evidence of urinary tract calculus. The obstruction is likely due to the soft tissue mass is seen at the base of the bladder due to local recurrence of prostate cancer. However, cystoscopy could be performed to assess for uroepithelial process as well as allowed for decompression and stent placement. 3. Aortic Atherosclerosis (ICD10-I70.0).  Dr. Gloriann Loan of Urology was consulted. He expressed concern that this could represent recurrence of prostate cancer.  [facility] were consulted to admit the patient for further evaluation and treatment. The patient will be admitted and he will be kept NPO overnight for an anticipated procedure tomorrow.  Hospital Course: On 07/23/2020 the patient went for cystoscopy with balloon dilatation of bladder neck contracture, and right retrograde pyelogram with right ureteral stent placement. He has been found to have recurrent prostate cancer with locally advanced growth in the bladder and metastatic lymphadenopathy and bone metastasis. The patient has been cleared for discharge by urology. He is to keep the foley catheter in for one week. The patient will follow up with urology as outpatient in one week for voiding trial. ADT is likely the best option for this patient with limited functional status and multiple comorbidities per Dr. Gloriann Loan.  Today's assessment: S: The patient is resting quietly. No new complaints. O: Vitals:  Vitals:   07/25/20 0643 07/25/20 1230  BP: 124/62 128/71    Pulse: 61 63  Resp: 18 12  Temp: 98.3 F (36.8 C) 98.1 F (36.7 C)  SpO2: 96% 98%   General appearance:alert, cooperative and no distress Head:Normocephalic, without obvious abnormality, atraumatic Resp:No increased work of breathing. No wheezes, rales, or rhonchi. No tactile fremitus. Chest wall:no tenderness Cardio:regular rate and rhythm, S1, S2 normal, no murmur, click, rub or gallop JX:BJYN, non-tender; bowel sounds normal; no masses, no organomegaly Extremities:extremities normal, atraumatic, no cyanosis or edema Pulses:2+ and symmetric Skin:Skin color, texture, turgor normal. No rashes or lesions Lymph nodes:Cervical, supraclavicular, and axillary nodes normal. Neurologic:Grossly normal  Discharge Instructions  Discharge Instructions    Activity as tolerated - No restrictions   Complete by: As directed    Call MD for:  persistant nausea and vomiting   Complete by: As directed    Call MD for:  severe uncontrolled pain   Complete by: As directed    Call MD for:  temperature >100.4   Complete by: As directed    Diet - low sodium heart healthy   Complete by: As directed    Discharge instructions   Complete by: As directed    Discharge to home with foley catheter in place. Follow up with PCP in 7-10 days. Have chemistry and CBC drawn at that visit. Follow up with Urology in one week for voiding trial.   Increase activity slowly   Complete by: As directed      Allergies as of 07/25/2020   No Known Allergies     Medication List    STOP taking these medications   amLODipine 10 MG tablet Commonly known as: NORVASC     TAKE these medications   aspirin 81 MG EC tablet Take 1 tablet (81 mg total) by mouth daily.   atorvastatin 80 MG tablet Commonly known as: LIPITOR Take 1 tablet (80 mg total) by mouth daily at 6 PM.   buPROPion 150 MG 12 hr tablet Commonly known as: WELLBUTRIN SR Take 150 mg by mouth 2 (two) times daily.   clopidogrel 75 MG  tablet Commonly known as: PLAVIX Take 1 tablet (75 mg total) by mouth daily.   divalproex 500 MG DR tablet Commonly known as: DEPAKOTE Take 500 mg by mouth 2 (two) times daily.   Fish Oil 1000 MG Caps Take 1,000 mg by mouth daily.   folic acid 1 MG tablet Commonly known as: FOLVITE Take 1 tablet (1 mg total) by mouth daily.   HYDROcodone-acetaminophen 5-325 MG tablet Commonly known as: NORCO/VICODIN Take 1-2 tablets by mouth every 4 (four) hours as needed for moderate pain.   levETIRAcetam 500 MG tablet Commonly known as: KEPPRA Take 1 tablet (500 mg total) by mouth 2 (two) times daily.   ONE-A-DAY 50 PLUS PO Take 1 tablet by mouth daily.   multivitamin Tabs tablet Take 1 tablet by mouth daily.   ondansetron 8 MG disintegrating tablet Commonly known as: ZOFRAN-ODT Take 8 mg by mouth 3 (three) times daily.   risperiDONE 1 MG tablet Commonly known as: RISPERDAL Take 1 tablet (1 mg total) by mouth 2 (two) times daily.   thiamine 100 MG tablet Take 1 tablet (100 mg total) by mouth daily.   traZODone 50 MG tablet Commonly known as: DESYREL Take 1 tablet (50 mg total) by mouth at bedtime.      No Known  Allergies  The results of significant diagnostics from this hospitalization (including imaging, microbiology, ancillary and laboratory) are listed below for reference.    Significant Diagnostic Studies: DG C-Arm 1-60 Min-No Report  Result Date: 07/23/2020 Fluoroscopy was utilized by the requesting physician.  No radiographic interpretation.   CT Renal Stone Study  Result Date: 07/22/2020 CLINICAL DATA:  Left renal cell carcinoma status post nephrectomy, lethargy, right flank pain EXAM: CT ABDOMEN AND PELVIS WITHOUT CONTRAST TECHNIQUE: Multidetector CT imaging of the abdomen and pelvis was performed following the standard protocol without IV contrast. COMPARISON:  02/18/2018 FINDINGS: Lower chest: Hypoventilatory changes are seen at the lung bases. No acute pleural or  parenchymal lung disease. Hepatobiliary: No focal liver abnormality is seen. No gallstones, gallbladder wall thickening, or biliary dilatation. Pancreas: Unremarkable. No pancreatic ductal dilatation or surrounding inflammatory changes. Spleen: Normal in size without focal abnormality. Adrenals/Urinary Tract: Left kidney is surgically absent. Left adrenal has also likely been surgically removed. There is high-grade right-sided obstructive uropathy without evidence of radiopaque calculus. Dilated ureter extends to the right UVJ. There is eccentric bladder wall thickening near the right UVJ, though evaluation of the bladder is limited due to under distension. Cystoscopy is recommended for further evaluation. Stomach/Bowel: No bowel obstruction or ileus. Normal appendix right lower quadrant. No bowel wall thickening or inflammatory change. Vascular/Lymphatic: Extensive atherosclerosis is seen throughout the aorta and its distal branches. There are abnormal lymph nodes throughout the retroperitoneum, increased in number. Largest anterior to the right crus of the diaphragm image 23 measures 11 mm in short axis. Lymphadenopathy extends along the right pelvic sidewall, largest node along the right iliac chain image 65 measuring 15 mm in short axis. Findings favor metastatic disease. There is mild fat stranding in the retroperitoneum and along the right pelvic sidewall. Reproductive: Prostate is surgically absent. Other: There is no free fluid or free gas. No abdominal wall hernia. Musculoskeletal: Sclerotic lesions are seen throughout the spine, thoracic cage, bony pelvis, and proximal femurs consistent with diffuse bony metastases. No acute displaced fracture. Reconstructed images demonstrate no additional findings. IMPRESSION: 1. Findings most consistent with metastatic prostate cancer. There is soft tissue nodularity at the base of the bladder, as well as multiple enlarged lymph nodes throughout the retroperitoneum.  Diffuse skeletal sclerotic metastases are noted. 2. High-grade right-sided obstructive uropathy without evidence of urinary tract calculus. The obstruction is likely due to the soft tissue mass is seen at the base of the bladder due to local recurrence of prostate cancer. However, cystoscopy could be performed to assess for uroepithelial process as well as allowed for decompression and stent placement. 3.  Aortic Atherosclerosis (ICD10-I70.0). Electronically Signed   By: Randa Ngo M.D.   On: 07/22/2020 15:33    Microbiology: Recent Results (from the past 240 hour(s))  Urine culture     Status: Abnormal   Collection Time: 07/22/20  1:23 PM   Specimen: Urine, Random  Result Value Ref Range Status   Specimen Description   Final    URINE, RANDOM Performed at [facility] [address], [place], [place] [address]    Special Requests   Final    NONE Performed at [facility], [place] [address] [place], [place] [address]    Culture (A)  Final    <10,000 COLONIES/mL INSIGNIFICANT GROWTH Performed at [facility] [address], [place], [place] [address]    Report Status 07/24/2020 FINAL  Final  SARS Coronavirus 2 by RT PCR (hospital  order, performed in [facility] hospital lab) Nasopharyngeal Nasopharyngeal Swab     Status: None   Collection Time: 07/22/20  5:04 PM   Specimen: Nasopharyngeal Swab  Result Value Ref Range Status   SARS Coronavirus 2 NEGATIVE NEGATIVE Final    Comment: (NOTE) SARS-CoV-2 target nucleic acids are NOT DETECTED.  The SARS-CoV-2 RNA is generally detectable in upper and lower respiratory specimens during the acute phase of infection. The lowest concentration of SARS-CoV-2 viral copies this assay can detect is 250 copies / mL. A negative result does not preclude SARS-CoV-2 infection and should not be used as the sole basis for treatment or other patient management decisions.  A negative result may occur  with improper specimen collection / handling, submission of specimen other than nasopharyngeal swab, presence of viral mutation(s) within the areas targeted by this assay, and inadequate number of viral copies (<250 copies / mL). A negative result must be combined with clinical observations, patient history, and epidemiological information.  Fact Sheet for Patients:   StrictlyIdeas.no  Fact Sheet for Healthcare Providers: BankingDealers.co.za  This test is not yet approved or  cleared by the [place] FDA and has been authorized for detection and/or diagnosis of SARS-CoV-2 by FDA under an Emergency Use Authorization (EUA).  This EUA will remain in effect (meaning this test can be used) for the duration of the COVID-19 declaration under Section 564(b)(1) of the Act, 21 U.S.C. section 360bbb-3(b)(1), unless the authorization is terminated or revoked sooner.  Performed at [facility], [place] [address] [place], [place] [address]      Labs: Basic Metabolic Panel: Recent Labs  Lab 07/22/20 1338 07/23/20 0615 07/24/20 0320  NA 142 139 139  K 4.7 3.9 4.8  CL 105 108 108  CO2 21* 23 23  GLUCOSE 157* 116* 101*  BUN 29* 27* 21*  CREATININE 1.99* 1.64* 1.70*  CALCIUM 8.7* 8.4* 8.2*   Liver Function Tests: No results for input(s): AST, ALT, ALKPHOS, BILITOT, PROT, ALBUMIN in the last 168 hours. No results for input(s): LIPASE, AMYLASE in the last 168 hours. No results for input(s): AMMONIA in the last 168 hours. CBC: Recent Labs  Lab 07/22/20 1338 07/23/20 0615 07/24/20 0320  WBC 8.7 6.4 9.1  NEUTROABS 5.4  --  5.8  HGB 11.9* 10.3* 10.6*  HCT 36.9* 32.0* 33.7*  MCV 88.3 88.2 89.9  PLT 247 203 193   Cardiac Enzymes: No results for input(s): CKTOTAL, CKMB, CKMBINDEX, TROPONINI in the last 168 hours. BNP: BNP (last 3 results) No results for input(s): BNP in the last 8760 hours.  ProBNP (last 3  results) No results for input(s): PROBNP in the last 8760 hours.  CBG: No results for input(s): GLUCAP in the last 168 hours.  Principal Problem:   Acute urinary obstruction Active Problems:   Essential hypertension   CAD (coronary artery disease)   Tobacco abuse   AKI (acute kidney injury) ([place])   Chronic alcohol abuse   NSTEMI (non-ST elevated myocardial infarction) ([place])  Time coordinating discharge: 38 minutes.  Signed:        Meta Kroenke, DO [facility]  07/25/2020, 6:01 PM

## 2020-07-25 NOTE — Plan of Care (Signed)
  Problem: Education: Goal: Knowledge of General Education information will improve Description: Including pain rating scale, medication(s)/side effects and non-pharmacologic comfort measures Outcome: Adequate for Discharge   Problem: Activity: Goal: Risk for activity intolerance will decrease Outcome: Adequate for Discharge   Problem: Nutrition: Goal: Adequate nutrition will be maintained Outcome: Adequate for Discharge   Problem: Elimination: Goal: Will not experience complications related to bowel motility Outcome: Adequate for Discharge Goal: Will not experience complications related to urinary retention Outcome: Adequate for Discharge   Problem: Pain Managment: Goal: General experience of comfort will improve Outcome: Adequate for Discharge   Problem: Safety: Goal: Ability to remain free from injury will improve Outcome: Adequate for Discharge   Problem: Skin Integrity: Goal: Risk for impaired skin integrity will decrease Outcome: Adequate for Discharge   

## 2020-08-01 DIAGNOSIS — R339 Retention of urine, unspecified: Secondary | ICD-10-CM | POA: Diagnosis not present

## 2020-08-01 DIAGNOSIS — I10 Essential (primary) hypertension: Secondary | ICD-10-CM | POA: Diagnosis not present

## 2020-08-01 DIAGNOSIS — N1831 Chronic kidney disease, stage 3a: Secondary | ICD-10-CM | POA: Diagnosis not present

## 2020-08-03 DIAGNOSIS — R338 Other retention of urine: Secondary | ICD-10-CM | POA: Diagnosis not present

## 2020-08-22 DIAGNOSIS — R569 Unspecified convulsions: Secondary | ICD-10-CM | POA: Diagnosis not present

## 2020-08-22 DIAGNOSIS — I69398 Other sequelae of cerebral infarction: Secondary | ICD-10-CM | POA: Diagnosis not present

## 2020-09-05 ENCOUNTER — Ambulatory Visit: Payer: Self-pay | Admitting: *Deleted

## 2020-09-05 DIAGNOSIS — R531 Weakness: Secondary | ICD-10-CM | POA: Diagnosis not present

## 2020-09-05 NOTE — Telephone Encounter (Signed)
All questions answered by patient's wife.  Patient's daughter called [employer] as she was concerned about her father not eating or drinking today along with large volume of loose stool this a.m.  This RN contacted patient's home where I spoke with patient's wife.  Patient's wife very concerned about patient's decreased urine output and no po intake or food today.  Patient only has one kidney, has prostate cancer with mets to other organs and bones per wife.   Wife explained patient scheduled to see cancer doctor on Thursday.  This RN advised wife to call 911 for paramedics to evaluate patient and transport him to the nearest ER.  Wife agreed with plan and plans to call 911.    Reason for Disposition  Patient sounds very sick or weak to the triager  Answer Assessment - Initial Assessment Questions 1. DESCRIPTION: "Describe how you are feeling."     Patient's wife is concerned about patient as he is unable to eat or drink and has had one large loose stool today.  Patient has prostate cancer with mets to bones and other organs, one kidney, and is not eating or drinking today.  This a.m..  Wife emptied 16 oz from foley catheter bag, and now there is less than 200 ml of urine.  Urine is dark and at times bloody.   2. SEVERITY: "How bad is it?"  "Can you stand and walk?"   - MILD - Feels weak or tired, but does not interfere with work, school or normal activities   - [place] to stand and walk; weakness interferes with work, school, or normal activities   - SEVERE - Unable to stand or walk    Patient is weak in general and has trouble waking.  He has a wheelchair that he uses.  The place where he and is wife are staying is not handicap accessible.   3. ONSET:  "When did the weakness begin?"    Ongoing - but worse today.  4. CAUSE: "What do you think is causing the weakness?"    Cancer  5. MEDICINES: "Have you recently started a new medicine or had a change in the amount of a medicine?"     No reported changes.   6. OTHER SYMPTOMS: "Do you have any other symptoms?" (e.g., chest pain, fever, cough, SOB, vomiting, diarrhea, bleeding, other areas of pain)    Large loose stool this a.m.  No oral intake today.   7. PREGNANCY: "Is there any chance you are pregnant?" "When was your last menstrual period?"    N/A  Protocols used: WEAKNESS (GENERALIZED) AND FATIGUE-A-AH

## 2020-09-07 DIAGNOSIS — N189 Chronic kidney disease, unspecified: Secondary | ICD-10-CM | POA: Diagnosis not present

## 2020-09-07 DIAGNOSIS — I10 Essential (primary) hypertension: Secondary | ICD-10-CM | POA: Diagnosis not present

## 2020-09-07 DIAGNOSIS — Z905 Acquired absence of kidney: Secondary | ICD-10-CM | POA: Diagnosis not present

## 2020-09-07 DIAGNOSIS — Z8673 Personal history of transient ischemic attack (TIA), and cerebral infarction without residual deficits: Secondary | ICD-10-CM | POA: Diagnosis not present

## 2020-09-07 DIAGNOSIS — Z7902 Long term (current) use of antithrombotics/antiplatelets: Secondary | ICD-10-CM | POA: Diagnosis not present

## 2020-09-07 DIAGNOSIS — Z9861 Coronary angioplasty status: Secondary | ICD-10-CM | POA: Diagnosis not present

## 2020-09-07 DIAGNOSIS — D649 Anemia, unspecified: Secondary | ICD-10-CM | POA: Diagnosis not present

## 2020-09-07 DIAGNOSIS — N135 Crossing vessel and stricture of ureter without hydronephrosis: Secondary | ICD-10-CM | POA: Diagnosis not present

## 2020-09-07 DIAGNOSIS — Z85528 Personal history of other malignant neoplasm of kidney: Secondary | ICD-10-CM | POA: Diagnosis not present

## 2020-09-07 DIAGNOSIS — Z79899 Other long term (current) drug therapy: Secondary | ICD-10-CM | POA: Diagnosis not present

## 2020-09-07 DIAGNOSIS — F17200 Nicotine dependence, unspecified, uncomplicated: Secondary | ICD-10-CM | POA: Diagnosis not present

## 2020-09-07 DIAGNOSIS — Z7189 Other specified counseling: Secondary | ICD-10-CM | POA: Diagnosis not present

## 2020-09-11 ENCOUNTER — Encounter (HOSPITAL_COMMUNITY): Payer: Self-pay

## 2020-09-11 ENCOUNTER — Emergency Department (HOSPITAL_COMMUNITY)
Admission: EM | Admit: 2020-09-11 | Discharge: 2020-09-11 | Disposition: A | Discharge status: Home / Self Care | Payer: Medicare Other | Attending: Emergency Medicine | Admitting: Emergency Medicine

## 2020-09-11 ENCOUNTER — Other Ambulatory Visit: Payer: Self-pay

## 2020-09-11 ENCOUNTER — Emergency Department (HOSPITAL_COMMUNITY): Payer: Medicare Other

## 2020-09-11 DIAGNOSIS — G9389 Other specified disorders of brain: Secondary | ICD-10-CM | POA: Diagnosis not present

## 2020-09-11 DIAGNOSIS — I6389 Other cerebral infarction: Secondary | ICD-10-CM | POA: Diagnosis not present

## 2020-09-11 DIAGNOSIS — Z7982 Long term (current) use of aspirin: Secondary | ICD-10-CM | POA: Diagnosis not present

## 2020-09-11 DIAGNOSIS — C61 Malignant neoplasm of prostate: Secondary | ICD-10-CM | POA: Diagnosis not present

## 2020-09-11 DIAGNOSIS — I119 Hypertensive heart disease without heart failure: Secondary | ICD-10-CM | POA: Insufficient documentation

## 2020-09-11 DIAGNOSIS — E860 Dehydration: Secondary | ICD-10-CM | POA: Diagnosis not present

## 2020-09-11 DIAGNOSIS — R531 Weakness: Secondary | ICD-10-CM | POA: Diagnosis not present

## 2020-09-11 DIAGNOSIS — F1590 Other stimulant use, unspecified, uncomplicated: Secondary | ICD-10-CM | POA: Diagnosis not present

## 2020-09-11 DIAGNOSIS — F17200 Nicotine dependence, unspecified, uncomplicated: Secondary | ICD-10-CM | POA: Insufficient documentation

## 2020-09-11 DIAGNOSIS — R0689 Other abnormalities of breathing: Secondary | ICD-10-CM | POA: Diagnosis not present

## 2020-09-11 DIAGNOSIS — I25810 Atherosclerosis of coronary artery bypass graft(s) without angina pectoris: Secondary | ICD-10-CM | POA: Diagnosis not present

## 2020-09-11 DIAGNOSIS — R4182 Altered mental status, unspecified: Secondary | ICD-10-CM

## 2020-09-11 DIAGNOSIS — Z743 Need for continuous supervision: Secondary | ICD-10-CM | POA: Diagnosis not present

## 2020-09-11 LAB — CBC WITH DIFFERENTIAL/PLATELET
Abs Immature Granulocytes: 0.11 10*3/uL — ABNORMAL HIGH (ref 0.00–0.07)
Basophils Absolute: 0.1 10*3/uL (ref 0.0–0.1)
Basophils Relative: 1 %
Eosinophils Absolute: 0.2 10*3/uL (ref 0.0–0.5)
Eosinophils Relative: 3 %
HCT: 34.8 % — ABNORMAL LOW (ref 39.0–52.0)
Hemoglobin: 11.4 g/dL — ABNORMAL LOW (ref 13.0–17.0)
Immature Granulocytes: 2 %
Lymphocytes Relative: 23 %
Lymphs Abs: 1.7 10*3/uL (ref 0.7–4.0)
MCH: 28.9 pg (ref 26.0–34.0)
MCHC: 32.8 g/dL (ref 30.0–36.0)
MCV: 88.1 fL (ref 80.0–100.0)
Monocytes Absolute: 0.7 10*3/uL (ref 0.1–1.0)
Monocytes Relative: 10 %
Neutro Abs: 4.7 10*3/uL (ref 1.7–7.7)
Neutrophils Relative %: 61 %
Platelets: 219 10*3/uL (ref 150–400)
RBC: 3.95 MIL/uL — ABNORMAL LOW (ref 4.22–5.81)
RDW: 15.4 % (ref 11.5–15.5)
WBC: 7.5 10*3/uL (ref 4.0–10.5)
nRBC: 0 % (ref 0.0–0.2)

## 2020-09-11 LAB — COMPREHENSIVE METABOLIC PANEL
ALT: 23 U/L (ref 0–44)
AST: 29 U/L (ref 15–41)
Albumin: 3.3 g/dL — ABNORMAL LOW (ref 3.5–5.0)
Alkaline Phosphatase: 217 U/L — ABNORMAL HIGH (ref 38–126)
Anion gap: 10 (ref 5–15)
BUN: 20 mg/dL (ref 6–20)
CO2: 26 mmol/L (ref 22–32)
Calcium: 8.6 mg/dL — ABNORMAL LOW (ref 8.9–10.3)
Chloride: 103 mmol/L (ref 98–111)
Creatinine, Ser: 1.39 mg/dL — ABNORMAL HIGH (ref 0.61–1.24)
GFR, Estimated: 56 mL/min — ABNORMAL LOW (ref >60)
Glucose, Bld: 94 mg/dL (ref 70–99)
Potassium: 4.3 mmol/L (ref 3.5–5.1)
Sodium: 139 mmol/L (ref 135–145)
Total Bilirubin: 0.7 mg/dL (ref 0.3–1.2)
Total Protein: 6.9 g/dL (ref 6.5–8.1)

## 2020-09-11 LAB — URINALYSIS, ROUTINE W REFLEX MICROSCOPIC

## 2020-09-11 LAB — URINALYSIS, MICROSCOPIC (REFLEX)
RBC / HPF: >50 RBC/hpf (ref 0–5)
Squamous Epithelial / HPF: NONE SEEN (ref 0–5)
WBC, UA: NONE SEEN WBC/hpf (ref 0–5)

## 2020-09-11 NOTE — ED Triage Notes (Signed)
Pt brought to ED via [facility] for fall, denies LOC. Pt receives cancer treatments and takes narcotics to help with pain relief. Pt states he doesn't remember falling. Pt states he has been weak x 1 week now.

## 2020-09-11 NOTE — Discharge Instructions (Addendum)
Take your medication as directed.  Follow-up with your doctors.  Return for any new or worse symptoms.  Be careful not to take too much pain medication.

## 2020-09-11 NOTE — ED Provider Notes (Signed)
Checked on patient prior to 7 PM.  He still was somewhat somnolent.  The patient now is very awake and wanting to go home.  His wife did give him some extra pain medication today and that is most likely why he had the change in mental status.  There is understandable with his metastatic prostate cancer why he needs pain medication.  But we do recommend that they not give too much.  Recommend he follow-up with his doctors.  Patient stable for discharge home.  Labs without significant abnormalities.   Fredia Sorrow, MD 09/11/20 2135

## 2020-09-11 NOTE — ED Notes (Signed)
Pt provided with a meal tray per request at this time. Pt tolerating PO intake without distress.

## 2020-09-11 NOTE — ED Notes (Addendum)
I assumed care of this patient at this time. At the time of assuming care, patient appears in no acute distress. Respirations are even and unlabored with equal chest rise and fall. Pt appears sleepy, yet arousable. Cardiac monitor in place at this time.  Pt transported to CT by CT staff via stretcher at this time.

## 2020-09-11 NOTE — ED Provider Notes (Signed)
[facility] EMERGENCY DEPARTMENT Provider Note  CSN: [id number] Arrival date & time: 09/11/20 1034    History Chief Complaint  Patient presents with  . Weakness    HPI  Douglas Fowler is a 54 y.o. male with history of multiple medical problems, including relatively recent diagnosis of metastatic prostate cancer brought to the ED via EMS from home after a reported fall. Patient is unable to provide any useful history but states he thought he was supposed to be coming here for 'a kidney procedure'. He is taking hydrocodone for his cancer pain.    Past Medical History:  Diagnosis Date  . CAD (coronary artery disease)    s/p PCI with stents x 3  . Chronic alcohol abuse   . CVA (cerebral vascular accident) ([place])    multiple  . Hypertension   . Seizures ([place])   . Tobacco abuse     Past Surgical History:  Procedure Laterality Date  . CORONARY ANGIOPLASTY WITH STENT PLACEMENT     stents x 3  . CYSTOSCOPY WITH STENT PLACEMENT Right 07/23/2020   Procedure: CYSTOSCOPY WITH RIGHT RETROGRADE AND STENT PLACEMENT, AND URETHRAL DILATION;  Surgeon: Lucas Mallow, MD;  Location: [facility];  Service: Urology;  Laterality: Right;    Family History  Family history unknown: Yes    Social History   Tobacco Use  . Smoking status: Current Every Day Smoker    Packs/day: 1.00    Years: 5.00    Pack years: 5.00  . Smokeless tobacco: Never Used  Substance Use Topics  . Alcohol use: Not Currently    Comment: 1 gallon of whisky Q72 hours  . Drug use: Yes    Types: Marijuana    Comment: last used 2 days.     Home Medications Prior to Admission medications   Medication Sig Start Date End Date Taking? Authorizing Provider  aspirin EC 81 MG EC tablet Take 1 tablet (81 mg total) by mouth daily. Patient not taking: Reported on 05/12/2020 03/05/19   Geradine Girt, DO  atorvastatin (LIPITOR) 80 MG tablet Take 1 tablet (80 mg total) by mouth daily at 6 PM. 12/18/18   Raiford Noble Latif, DO   buPROPion [facility] SR) 150 MG 12 hr tablet Take 150 mg by mouth 2 (two) times daily. Patient not taking: Reported on 05/12/2020 10/08/19   [provider]  clopidogrel (PLAVIX) 75 MG tablet Take 1 tablet (75 mg total) by mouth daily. 03/05/19   Geradine Girt, DO  divalproex (DEPAKOTE) 500 MG DR tablet Take 500 mg by mouth 2 (two) times daily. 06/09/20   [provider]  folic acid (FOLVITE) 1 MG tablet Take 1 tablet (1 mg total) by mouth daily. Patient not taking: Reported on 05/12/2020 12/19/18   Raiford Noble Latif, DO  HYDROcodone-acetaminophen (NORCO/VICODIN) 5-325 MG tablet Take 1-2 tablets by mouth every 4 (four) hours as needed for moderate pain. 07/25/20   Swayze, Ava, DO  levETIRAcetam (KEPPRA) 500 MG tablet Take 1 tablet (500 mg total) by mouth 2 (two) times daily. 12/18/18   Raiford Noble Latif, DO  Multiple Vitamins-Minerals (ONE-A-DAY 50 PLUS PO) Take 1 tablet by mouth daily.    [provider]  multivitamin (PROSIGHT) TABS tablet Take 1 tablet by mouth daily. Patient not taking: Reported on 02/28/2019 12/19/18   Raiford Noble Latif, DO  Omega-3 Fatty Acids (FISH OIL) 1000 MG CAPS Take 1,000 mg by mouth daily.    [provider]  ondansetron (ZOFRAN-ODT)  8 MG disintegrating tablet Take 8 mg by mouth 3 (three) times daily. 07/12/20   [provider]  risperiDONE (RISPERDAL) 1 MG tablet Take 1 tablet (1 mg total) by mouth 2 (two) times daily. 11/18/19   Sharma Covert, MD  thiamine 100 MG tablet Take 1 tablet (100 mg total) by mouth daily. Patient not taking: Reported on 02/28/2019 12/19/18   Raiford Noble Latif, DO  traZODone (DESYREL) 50 MG tablet Take 1 tablet (50 mg total) by mouth at bedtime. 03/05/19   Geradine Girt, DO     Allergies    Patient has no known allergies.   Review of Systems   Review of Systems Unable to assess due to mental status.    Physical Exam BP 119/63 (BP Location: Right Arm)   Pulse 69   Temp 97.8 F (36.6  C) (Oral)   Resp (!) 7   Ht 5\' 6"  (1.676 m)   Wt 68 kg   SpO2 100%   BMI 24.21 kg/m   Physical Exam Vitals and nursing note reviewed.  Constitutional:      Appearance: Normal appearance.     Comments: Somnolent, arouses to verbal stimuli but falls back asleep  HENT:     Head: Normocephalic and atraumatic.     Nose: Nose normal.     Mouth/Throat:     Mouth: Mucous membranes are moist.  Eyes:     Conjunctiva/sclera: Conjunctivae normal.     Comments: Pupils are small  Cardiovascular:     Rate and Rhythm: Normal rate.  Pulmonary:     Effort: Pulmonary effort is normal.     Breath sounds: Normal breath sounds.  Abdominal:     General: Abdomen is flat.     Palpations: Abdomen is soft.     Tenderness: There is no abdominal tenderness.  Genitourinary:    Comments: Foley catheter in place Musculoskeletal:        General: No swelling. Normal range of motion.     Cervical back: Neck supple.  Skin:    General: Skin is warm and dry.  Neurological:     General: No focal deficit present.  Psychiatric:        Mood and Affect: Mood normal.      ED Results / Procedures / Treatments   Labs (all labs ordered are listed, but only abnormal results are displayed) Labs Reviewed  COMPREHENSIVE METABOLIC PANEL  CBC WITH DIFFERENTIAL/PLATELET  URINALYSIS, ROUTINE W REFLEX MICROSCOPIC    EKG EKG Interpretation  Date/Time:  Monday September 11 2020 10:48:11 EDT Ventricular Rate:  66 PR Interval:    QRS Duration: 96 QT Interval:  449 QTC Calculation: 471 R Axis:   -11 Text Interpretation: Sinus rhythm Normal ECG No significant change since last tracing Confirmed by Calvert Cantor [phone]) on 09/11/2020 10:54:52 AM   Radiology No results found.  Procedures Procedures  Medications Ordered in the ED Medications - No data to display   MDM Rules/Calculators/A&P MDM Patient with reported fall, although the details are unknown. He is sleepy with small pupils, may be over  narcotized. Will check labs, CT head and reassess.  ED Course  I have reviewed the triage vital signs and the nursing notes.  Pertinent labs & imaging results that were available during my care of the patient were reviewed by me and considered in my medical decision making (see chart for details).  Clinical Course as of Sep 12 699  Mon Sep 11, 2020  1334 CMP unremarkable.    [  CS]  1424 CBC unremarkable.    [CS]  0786 Patient more alert now, states he slipped walking at home and did not injury himself. Awaiting UA but if continues to clear mentally, will likely be safe for discharge.    [CS]  [place] of the patient signed out to Dr. Rogene Houston at the change of shift.    [CS]    Clinical Course User Index [CS] Truddie Hidden, MD    Final Clinical Impression(s) / ED Diagnoses Final diagnoses:  None    Rx / DC Orders ED Discharge Orders    None       Truddie Hidden, MD 09/12/20 0700

## 2020-09-13 ENCOUNTER — Other Ambulatory Visit: Payer: Self-pay

## 2020-09-13 ENCOUNTER — Emergency Department (HOSPITAL_COMMUNITY)
Admission: EM | Admit: 2020-09-13 | Discharge: 2020-09-13 | Disposition: A | Discharge status: Home / Self Care | Payer: Medicare Other | Attending: Emergency Medicine | Admitting: Emergency Medicine

## 2020-09-13 DIAGNOSIS — Y828 Other medical devices associated with adverse incidents: Secondary | ICD-10-CM | POA: Diagnosis not present

## 2020-09-13 DIAGNOSIS — T83028A Displacement of other indwelling urethral catheter, initial encounter: Secondary | ICD-10-CM | POA: Insufficient documentation

## 2020-09-13 DIAGNOSIS — R5381 Other malaise: Secondary | ICD-10-CM | POA: Diagnosis not present

## 2020-09-13 DIAGNOSIS — T83021A Displacement of indwelling urethral catheter, initial encounter: Secondary | ICD-10-CM

## 2020-09-13 DIAGNOSIS — I10 Essential (primary) hypertension: Secondary | ICD-10-CM | POA: Diagnosis not present

## 2020-09-13 DIAGNOSIS — I959 Hypotension, unspecified: Secondary | ICD-10-CM | POA: Diagnosis not present

## 2020-09-13 DIAGNOSIS — I2510 Atherosclerotic heart disease of native coronary artery without angina pectoris: Secondary | ICD-10-CM | POA: Insufficient documentation

## 2020-09-13 DIAGNOSIS — F17200 Nicotine dependence, unspecified, uncomplicated: Secondary | ICD-10-CM | POA: Insufficient documentation

## 2020-09-13 DIAGNOSIS — R0902 Hypoxemia: Secondary | ICD-10-CM | POA: Diagnosis not present

## 2020-09-13 DIAGNOSIS — Z7982 Long term (current) use of aspirin: Secondary | ICD-10-CM | POA: Diagnosis not present

## 2020-09-13 NOTE — ED Triage Notes (Signed)
Pt here from home via [facility] for catheter coming out. Blood noted.

## 2020-09-13 NOTE — ED Provider Notes (Signed)
[facility] EMERGENCY DEPARTMENT Provider Note   CSN: [id number] Arrival date & time: 09/13/20  0441   History Chief complaint: Foley catheter came out.  Douglas Fowler is a 54 y.o. male.  The history is provided by the patient.  54 year old male with history of hypertension, stroke, coronary artery disease, prostate cancer with chronic indwelling Foley catheter and comes in with the catheter having been accidentally pulled out. He states that he fell down as he was trying to step outside. There was moderate bleeding from the urethra following this. He denies other injury.  Past Medical History:  Diagnosis Date  . CAD (coronary artery disease)    s/p PCI with stents x 3  . Chronic alcohol abuse   . CVA (cerebral vascular accident) ([place])    multiple  . Hypertension   . Seizures ([place])   . Tobacco abuse     Patient Active Problem List   Diagnosis Date Noted  . Acute urinary obstruction 07/22/2020  . Dysautonomia ([place]) 05/13/2020  . MDD (major depressive disorder) 11/17/2019  . Acute lower UTI 03/02/2019  . NSTEMI (non-ST elevated myocardial infarction) ([place]) 03/02/2019  . Elevated troponin 03/01/2019  . Chronic alcohol abuse 03/01/2019  . Acute encephalopathy 02/28/2019  . Alcohol withdrawal delirium ([place]) 12/11/2018  . Alcohol withdrawal ([place]) 12/11/2018  . Alcohol withdrawal seizure with complication, with unspecified complication ([place]) 62/37/6283  . Essential hypertension 08/05/2018  . CAD (coronary artery disease) 08/05/2018  . Tobacco abuse 08/05/2018  . Hypokalemia   . AKI (acute kidney injury) ([place])   . Acute blood loss anemia   . CVA (cerebral vascular accident) ([place]) 08/04/2018    Past Surgical History:  Procedure Laterality Date  . CORONARY ANGIOPLASTY WITH STENT PLACEMENT     stents x 3  . CYSTOSCOPY WITH STENT PLACEMENT Right 07/23/2020   Procedure: CYSTOSCOPY WITH RIGHT RETROGRADE AND STENT PLACEMENT, AND URETHRAL DILATION;  Surgeon: Lucas Mallow,  MD;  Location: [facility];  Service: Urology;  Laterality: Right;       Family History  Family history unknown: Yes    Social History   Tobacco Use  . Smoking status: Current Every Day Smoker    Packs/day: 1.00    Years: 5.00    Pack years: 5.00  . Smokeless tobacco: Never Used  Substance Use Topics  . Alcohol use: Not Currently    Comment: 1 gallon of whisky Q72 hours  . Drug use: Yes    Types: Marijuana    Comment: last used 2 days.    Home Medications Prior to Admission medications   Medication Sig Start Date End Date Taking? Authorizing Provider  aspirin EC 81 MG EC tablet Take 1 tablet (81 mg total) by mouth daily. Patient not taking: Reported on 05/12/2020 03/05/19   Geradine Girt, DO  atorvastatin (LIPITOR) 80 MG tablet Take 1 tablet (80 mg total) by mouth daily at 6 PM. 12/18/18   Raiford Noble Latif, DO  buPROPion [facility] SR) 150 MG 12 hr tablet Take 150 mg by mouth 2 (two) times daily. Patient not taking: Reported on 05/12/2020 10/08/19   [provider]  clopidogrel (PLAVIX) 75 MG tablet Take 1 tablet (75 mg total) by mouth daily. 03/05/19   Geradine Girt, DO  divalproex (DEPAKOTE) 500 MG DR tablet Take 500 mg by mouth 2 (two) times daily. 06/09/20   [provider]  folic acid (FOLVITE) 1 MG tablet Take 1 tablet (1 mg total) by mouth daily. Patient not  taking: Reported on 05/12/2020 12/19/18   Raiford Noble Latif, DO  HYDROcodone-acetaminophen (NORCO/VICODIN) 5-325 MG tablet Take 1-2 tablets by mouth every 4 (four) hours as needed for moderate pain. 07/25/20   Swayze, Ava, DO  levETIRAcetam (KEPPRA) 500 MG tablet Take 1 tablet (500 mg total) by mouth 2 (two) times daily. 12/18/18   Raiford Noble Latif, DO  Multiple Vitamins-Minerals (ONE-A-DAY 50 PLUS PO) Take 1 tablet by mouth daily.    [provider]  multivitamin (PROSIGHT) TABS tablet Take 1 tablet by mouth daily. Patient not taking: Reported on 02/28/2019 12/19/18   Raiford Noble Latif, DO    Omega-3 Fatty Acids (FISH OIL) 1000 MG CAPS Take 1,000 mg by mouth daily.    [provider]  ondansetron (ZOFRAN-ODT) 8 MG disintegrating tablet Take 8 mg by mouth 3 (three) times daily. 07/12/20   [provider]  risperiDONE (RISPERDAL) 1 MG tablet Take 1 tablet (1 mg total) by mouth 2 (two) times daily. 11/18/19   Sharma Covert, MD  thiamine 100 MG tablet Take 1 tablet (100 mg total) by mouth daily. Patient not taking: Reported on 02/28/2019 12/19/18   Raiford Noble Latif, DO  traZODone (DESYREL) 50 MG tablet Take 1 tablet (50 mg total) by mouth at bedtime. 03/05/19   Geradine Girt, DO    Allergies    Patient has no known allergies.  Review of Systems   Review of Systems  All other systems reviewed and are negative.   Physical Exam Updated Vital Signs BP 133/76   Pulse 79   Temp 98.4 F (36.9 C)   Resp 18   Ht 5\' 6"  (1.676 m)   Wt 68 kg   SpO2 98%   BMI 24.21 kg/m   Physical Exam Vitals and nursing note reviewed.   54 year old male, resting comfortably and in no acute distress. Vital signs are normal. Oxygen saturation is 98%, which is normal. Head is normocephalic and atraumatic. PERRLA, EOMI. Oropharynx is clear. Neck is nontender and supple without adenopathy or JVD. Back is nontender and there is no CVA tenderness. Lungs are clear without rales, wheezes, or rhonchi. Chest is nontender. Heart has regular rate and rhythm without murmur. Abdomen is soft, flat, nontender without masses or hepatosplenomegaly and peristalsis is normoactive. Genitalia: Circumcised penis with some blood present in the urethral meatus. Testes descended without masses. Extremities have no cyanosis or edema, full range of motion is present. Skin is warm and dry without rash. Neurologic: Mental status is normal, cranial nerves are intact, there are no motor or sensory deficits.   ED Results / Procedures / Treatments    Procedures Procedures  Medications Ordered in  ED Medications - No data to display  ED Course  I have reviewed the triage vital signs and the nursing notes.  MDM Rules/Calculators/A&P Traumatic accidental removal of Foley catheter. New catheter is placed and there is some blood present in the urine. Catheter is irrigated. Old records are reviewed confirming Foley catheter placed because of bladder outlet obstruction from recurrent prostate cancer.  Foley was irrigated and urine cleared.  He is discharged with instructions to follow-up with his urologist.  Final Clinical Impression(s) / ED Diagnoses Final diagnoses:  Displacement of Foley catheter, initial encounter [facility])    Rx / DC Orders ED Discharge Orders    None       Delora Fuel, MD 33/29/51 0715

## 2020-09-14 DIAGNOSIS — R338 Other retention of urine: Secondary | ICD-10-CM | POA: Diagnosis not present

## 2020-09-27 ENCOUNTER — Encounter (HOSPITAL_COMMUNITY): Payer: Self-pay

## 2020-09-27 ENCOUNTER — Emergency Department (HOSPITAL_COMMUNITY): Payer: Medicare Other

## 2020-09-27 ENCOUNTER — Observation Stay (HOSPITAL_COMMUNITY): Payer: Medicare Other

## 2020-09-27 ENCOUNTER — Inpatient Hospital Stay (HOSPITAL_COMMUNITY)
Admission: EM | Admit: 2020-09-27 | Discharge: 2020-09-29 | DRG: 698 | Disposition: A | Discharge status: Hospice | Payer: Medicare Other | Attending: Family Medicine | Admitting: Family Medicine

## 2020-09-27 ENCOUNTER — Other Ambulatory Visit: Payer: Self-pay

## 2020-09-27 DIAGNOSIS — D649 Anemia, unspecified: Secondary | ICD-10-CM | POA: Diagnosis present

## 2020-09-27 DIAGNOSIS — J189 Pneumonia, unspecified organism: Secondary | ICD-10-CM | POA: Diagnosis present

## 2020-09-27 DIAGNOSIS — R296 Repeated falls: Secondary | ICD-10-CM | POA: Diagnosis not present

## 2020-09-27 DIAGNOSIS — Z79899 Other long term (current) drug therapy: Secondary | ICD-10-CM

## 2020-09-27 DIAGNOSIS — R279 Unspecified lack of coordination: Secondary | ICD-10-CM | POA: Diagnosis not present

## 2020-09-27 DIAGNOSIS — Z7401 Bed confinement status: Secondary | ICD-10-CM | POA: Diagnosis not present

## 2020-09-27 DIAGNOSIS — R569 Unspecified convulsions: Secondary | ICD-10-CM

## 2020-09-27 DIAGNOSIS — Z720 Tobacco use: Secondary | ICD-10-CM | POA: Diagnosis present

## 2020-09-27 DIAGNOSIS — F0390 Unspecified dementia without behavioral disturbance: Secondary | ICD-10-CM | POA: Diagnosis present

## 2020-09-27 DIAGNOSIS — E860 Dehydration: Secondary | ICD-10-CM | POA: Diagnosis not present

## 2020-09-27 DIAGNOSIS — N390 Urinary tract infection, site not specified: Secondary | ICD-10-CM | POA: Diagnosis present

## 2020-09-27 DIAGNOSIS — I2510 Atherosclerotic heart disease of native coronary artery without angina pectoris: Secondary | ICD-10-CM | POA: Diagnosis present

## 2020-09-27 DIAGNOSIS — Z955 Presence of coronary angioplasty implant and graft: Secondary | ICD-10-CM | POA: Diagnosis not present

## 2020-09-27 DIAGNOSIS — I10 Essential (primary) hypertension: Secondary | ICD-10-CM | POA: Diagnosis not present

## 2020-09-27 DIAGNOSIS — R404 Transient alteration of awareness: Secondary | ICD-10-CM | POA: Diagnosis not present

## 2020-09-27 DIAGNOSIS — F419 Anxiety disorder, unspecified: Secondary | ICD-10-CM | POA: Diagnosis present

## 2020-09-27 DIAGNOSIS — F17210 Nicotine dependence, cigarettes, uncomplicated: Secondary | ICD-10-CM | POA: Diagnosis present

## 2020-09-27 DIAGNOSIS — F0150 Vascular dementia without behavioral disturbance: Secondary | ICD-10-CM | POA: Diagnosis present

## 2020-09-27 DIAGNOSIS — Y846 Urinary catheterization as the cause of abnormal reaction of the patient, or of later complication, without mention of misadventure at the time of the procedure: Secondary | ICD-10-CM | POA: Diagnosis present

## 2020-09-27 DIAGNOSIS — Z66 Do not resuscitate: Secondary | ICD-10-CM

## 2020-09-27 DIAGNOSIS — Z515 Encounter for palliative care: Secondary | ICD-10-CM | POA: Diagnosis not present

## 2020-09-27 DIAGNOSIS — R918 Other nonspecific abnormal finding of lung field: Secondary | ICD-10-CM | POA: Diagnosis not present

## 2020-09-27 DIAGNOSIS — T83511A Infection and inflammatory reaction due to indwelling urethral catheter, initial encounter: Principal | ICD-10-CM | POA: Diagnosis present

## 2020-09-27 DIAGNOSIS — Z7902 Long term (current) use of antithrombotics/antiplatelets: Secondary | ICD-10-CM

## 2020-09-27 DIAGNOSIS — I6389 Other cerebral infarction: Secondary | ICD-10-CM | POA: Diagnosis not present

## 2020-09-27 DIAGNOSIS — R531 Weakness: Secondary | ICD-10-CM | POA: Diagnosis not present

## 2020-09-27 DIAGNOSIS — E871 Hypo-osmolality and hyponatremia: Secondary | ICD-10-CM | POA: Diagnosis present

## 2020-09-27 DIAGNOSIS — G9340 Encephalopathy, unspecified: Secondary | ICD-10-CM

## 2020-09-27 DIAGNOSIS — C7951 Secondary malignant neoplasm of bone: Secondary | ICD-10-CM | POA: Diagnosis present

## 2020-09-27 DIAGNOSIS — I69311 Memory deficit following cerebral infarction: Secondary | ICD-10-CM | POA: Diagnosis not present

## 2020-09-27 DIAGNOSIS — G9341 Metabolic encephalopathy: Secondary | ICD-10-CM | POA: Diagnosis not present

## 2020-09-27 DIAGNOSIS — R0600 Dyspnea, unspecified: Secondary | ICD-10-CM

## 2020-09-27 DIAGNOSIS — Z20822 Contact with and (suspected) exposure to covid-19: Secondary | ICD-10-CM | POA: Diagnosis present

## 2020-09-27 DIAGNOSIS — F329 Major depressive disorder, single episode, unspecified: Secondary | ICD-10-CM | POA: Diagnosis present

## 2020-09-27 DIAGNOSIS — I252 Old myocardial infarction: Secondary | ICD-10-CM

## 2020-09-27 DIAGNOSIS — G9389 Other specified disorders of brain: Secondary | ICD-10-CM | POA: Diagnosis not present

## 2020-09-27 DIAGNOSIS — W19XXXA Unspecified fall, initial encounter: Secondary | ICD-10-CM | POA: Diagnosis not present

## 2020-09-27 DIAGNOSIS — C61 Malignant neoplasm of prostate: Secondary | ICD-10-CM

## 2020-09-27 DIAGNOSIS — I69354 Hemiplegia and hemiparesis following cerebral infarction affecting left non-dominant side: Secondary | ICD-10-CM | POA: Diagnosis not present

## 2020-09-27 DIAGNOSIS — R627 Adult failure to thrive: Secondary | ICD-10-CM | POA: Diagnosis not present

## 2020-09-27 DIAGNOSIS — I639 Cerebral infarction, unspecified: Secondary | ICD-10-CM | POA: Diagnosis not present

## 2020-09-27 DIAGNOSIS — Z7189 Other specified counseling: Secondary | ICD-10-CM

## 2020-09-27 DIAGNOSIS — C772 Secondary and unspecified malignant neoplasm of intra-abdominal lymph nodes: Secondary | ICD-10-CM | POA: Diagnosis not present

## 2020-09-27 DIAGNOSIS — Z743 Need for continuous supervision: Secondary | ICD-10-CM | POA: Diagnosis not present

## 2020-09-27 DIAGNOSIS — C7982 Secondary malignant neoplasm of genital organs: Secondary | ICD-10-CM | POA: Diagnosis not present

## 2020-09-27 DIAGNOSIS — F1011 Alcohol abuse, in remission: Secondary | ICD-10-CM | POA: Diagnosis present

## 2020-09-27 HISTORY — DX: Malignant (primary) neoplasm, unspecified: C80.1

## 2020-09-27 LAB — COMPREHENSIVE METABOLIC PANEL
ALT: 14 U/L (ref 0–44)
AST: 18 U/L (ref 15–41)
Albumin: 3 g/dL — ABNORMAL LOW (ref 3.5–5.0)
Alkaline Phosphatase: 156 U/L — ABNORMAL HIGH (ref 38–126)
Anion gap: 9 (ref 5–15)
BUN: 17 mg/dL (ref 6–20)
CO2: 26 mmol/L (ref 22–32)
Calcium: 8.3 mg/dL — ABNORMAL LOW (ref 8.9–10.3)
Chloride: 102 mmol/L (ref 98–111)
Creatinine, Ser: 1.26 mg/dL — ABNORMAL HIGH (ref 0.61–1.24)
GFR, Estimated: >60 mL/min (ref >60)
Glucose, Bld: 110 mg/dL — ABNORMAL HIGH (ref 70–99)
Potassium: 3.9 mmol/L (ref 3.5–5.1)
Sodium: 137 mmol/L (ref 135–145)
Total Bilirubin: 0.5 mg/dL (ref 0.3–1.2)
Total Protein: 6.1 g/dL — ABNORMAL LOW (ref 6.5–8.1)

## 2020-09-27 LAB — CBC WITH DIFFERENTIAL/PLATELET
Abs Immature Granulocytes: 0.06 10*3/uL (ref 0.00–0.07)
Basophils Absolute: 0 10*3/uL (ref 0.0–0.1)
Basophils Relative: 0 %
Eosinophils Absolute: 0 10*3/uL (ref 0.0–0.5)
Eosinophils Relative: 0 %
HCT: 30.6 % — ABNORMAL LOW (ref 39.0–52.0)
Hemoglobin: 9.8 g/dL — ABNORMAL LOW (ref 13.0–17.0)
Immature Granulocytes: 1 %
Lymphocytes Relative: 12 %
Lymphs Abs: 1 10*3/uL (ref 0.7–4.0)
MCH: 29.1 pg (ref 26.0–34.0)
MCHC: 32 g/dL (ref 30.0–36.0)
MCV: 90.8 fL (ref 80.0–100.0)
Monocytes Absolute: 1.7 10*3/uL — ABNORMAL HIGH (ref 0.1–1.0)
Monocytes Relative: 20 %
Neutro Abs: 5.6 10*3/uL (ref 1.7–7.7)
Neutrophils Relative %: 67 %
Platelets: 160 10*3/uL (ref 150–400)
RBC: 3.37 MIL/uL — ABNORMAL LOW (ref 4.22–5.81)
RDW: 15.1 % (ref 11.5–15.5)
WBC: 8.4 10*3/uL (ref 4.0–10.5)
nRBC: 0 % (ref 0.0–0.2)

## 2020-09-27 LAB — RESPIRATORY PANEL BY RT PCR (FLU A&B, COVID)
Influenza A by PCR: NEGATIVE
Influenza B by PCR: NEGATIVE
SARS Coronavirus 2 by RT PCR: NEGATIVE

## 2020-09-27 LAB — URINALYSIS, ROUTINE W REFLEX MICROSCOPIC
Bacteria, UA: NONE SEEN
Bilirubin Urine: NEGATIVE
Glucose, UA: NEGATIVE mg/dL
Ketones, ur: 5 mg/dL — AB
Nitrite: NEGATIVE
Protein, ur: 100 mg/dL — AB
RBC / HPF: >50 RBC/hpf — ABNORMAL HIGH (ref 0–5)
Specific Gravity, Urine: 1.016 (ref 1.005–1.030)
pH: 8 (ref 5.0–8.0)

## 2020-09-27 LAB — BLOOD GAS, ARTERIAL
Acid-Base Excess: 0.7 mmol/L (ref 0.0–2.0)
Bicarbonate: 25.4 mmol/L (ref 20.0–28.0)
Drawn by: 38235
FIO2: 21
O2 Saturation: 98.5 %
Patient temperature: 37
pCO2 arterial: 31.5 mmHg — ABNORMAL LOW (ref 32.0–48.0)
pH, Arterial: 7.49 — ABNORMAL HIGH (ref 7.350–7.450)
pO2, Arterial: 106 mmHg (ref 83.0–108.0)

## 2020-09-27 LAB — AMMONIA: Ammonia: 15 umol/L (ref 9–35)

## 2020-09-27 LAB — CBG MONITORING, ED: Glucose-Capillary: 107 mg/dL — ABNORMAL HIGH (ref 70–99)

## 2020-09-27 LAB — PROTIME-INR
INR: 1.1 (ref 0.8–1.2)
Prothrombin Time: 13.7 seconds (ref 11.4–15.2)

## 2020-09-27 LAB — CK: Total CK: 163 U/L (ref 49–397)

## 2020-09-27 LAB — LACTIC ACID, PLASMA: Lactic Acid, Venous: 1.3 mmol/L (ref 0.5–1.9)

## 2020-09-27 LAB — VALPROIC ACID LEVEL: Valproic Acid Lvl: 91 ug/mL (ref 50.0–100.0)

## 2020-09-27 MED ORDER — ACETAMINOPHEN 650 MG RE SUPP
650.0000 mg | Freq: Once | RECTAL | Status: AC
  Administered 2020-09-27: 650 mg via RECTAL
  Filled 2020-09-27: qty 1

## 2020-09-27 MED ORDER — ONDANSETRON HCL 4 MG PO TABS: 4.0000 mg | ORAL_TABLET | Freq: Four times a day (QID) | ORAL | Status: DC | PRN

## 2020-09-27 MED ORDER — SODIUM CHLORIDE 0.9 % IV SOLN: INTRAVENOUS | Status: DC

## 2020-09-27 MED ORDER — SODIUM CHLORIDE 0.9 % IV BOLUS
1000.0000 mL | Freq: Once | INTRAVENOUS | Status: AC
  Administered 2020-09-27: 1000 mL via INTRAVENOUS

## 2020-09-27 MED ORDER — ACETAMINOPHEN 650 MG RE SUPP: 650.0000 mg | Freq: Four times a day (QID) | RECTAL | Status: DC | PRN

## 2020-09-27 MED ORDER — ACETAMINOPHEN 325 MG PO TABS: 650.0000 mg | ORAL_TABLET | Freq: Four times a day (QID) | ORAL | Status: DC | PRN

## 2020-09-27 MED ORDER — ACETAMINOPHEN 500 MG PO TABS: 1000.0000 mg | ORAL_TABLET | Freq: Once | ORAL | Status: DC

## 2020-09-27 MED ORDER — SODIUM CHLORIDE 0.9 % IV SOLN
1.0000 g | Freq: Once | INTRAVENOUS | Status: AC
  Administered 2020-09-27: 1 g via INTRAVENOUS
  Filled 2020-09-27: qty 10

## 2020-09-27 MED ORDER — ONDANSETRON HCL 4 MG/2ML IJ SOLN: 4.0000 mg | Freq: Four times a day (QID) | INTRAMUSCULAR | Status: DC | PRN

## 2020-09-27 NOTE — ED Provider Notes (Signed)
[facility] EMERGENCY DEPARTMENT Provider Note   CSN: [id number] Arrival date & time: 09/27/20  1520     History Chief Complaint  Patient presents with  . Altered Mental Status    Douglas Fowler is a 54 y.o. male.  Pt presents to the ED today with AMS.  The pt was found this morning on the ground by his wife.  Pt has a hx of metastatic prostate cancer and has an indwelling foley.  Pt is unable to give much hx.   Pt's wife is here and is able to give more history.  Pt has not had any pain meds in several days because he had run out.  The last few days, he's been falling frequently.  He has been incontinent of stool for a few days.  Pt's wife unable to care for him at home.  He was worse this am and developed a fever.  The hospice nurse was there for an initial evaluation.  His wife also said he's been hallucinating at home.  She recommended that the wife call EMS.  His wife handles all meds.        Past Medical History:  Diagnosis Date  . CAD (coronary artery disease)    s/p PCI with stents x 3  . Cancer [facility])    prostate  . Chronic alcohol abuse   . CVA (cerebral vascular accident) ([place])    multiple  . Hypertension   . Seizures ([place])   . Tobacco abuse     Patient Active Problem List   Diagnosis Date Noted  . Acute urinary obstruction 07/22/2020  . Dysautonomia ([place]) 05/13/2020  . MDD (major depressive disorder) 11/17/2019  . Acute lower UTI 03/02/2019  . NSTEMI (non-ST elevated myocardial infarction) ([place]) 03/02/2019  . Elevated troponin 03/01/2019  . Chronic alcohol abuse 03/01/2019  . Acute encephalopathy 02/28/2019  . Alcohol withdrawal delirium ([place]) 12/11/2018  . Alcohol withdrawal ([place]) 12/11/2018  . Alcohol withdrawal seizure with complication, with unspecified complication ([place]) 40/98/1191  . Essential hypertension 08/05/2018  . CAD (coronary artery disease) 08/05/2018  . Tobacco abuse 08/05/2018  . Hypokalemia   . AKI (acute kidney injury) ([place])   .  Acute blood loss anemia   . CVA (cerebral vascular accident) ([place]) 08/04/2018    Past Surgical History:  Procedure Laterality Date  . CORONARY ANGIOPLASTY WITH STENT PLACEMENT     stents x 3  . CYSTOSCOPY WITH STENT PLACEMENT Right 07/23/2020   Procedure: CYSTOSCOPY WITH RIGHT RETROGRADE AND STENT PLACEMENT, AND URETHRAL DILATION;  Surgeon: Lucas Mallow, MD;  Location: [facility];  Service: Urology;  Laterality: Right;       Family History  Family history unknown: Yes    Social History   Tobacco Use  . Smoking status: Current Some Day Smoker    Packs/day: 1.00    Years: 5.00    Pack years: 5.00  . Smokeless tobacco: Never Used  Substance Use Topics  . Alcohol use: Not Currently    Comment: 1 gallon of whisky Q72 hours  . Drug use: Yes    Types: Marijuana    Home Medications Prior to Admission medications   Medication Sig Start Date End Date Taking? Authorizing Provider  atorvastatin (LIPITOR) 80 MG tablet Take 1 tablet (80 mg total) by mouth daily at 6 PM. 12/18/18  Yes Sheikh, Omair Latif, DO  clopidogrel (PLAVIX) 75 MG tablet Take 1 tablet (75 mg total) by mouth daily. 03/05/19  Yes Geradine Girt, DO  divalproex (DEPAKOTE) 500 MG DR tablet Take 500 mg by mouth 2 (two) times daily. 06/09/20  Yes [provider]  HYDROcodone-acetaminophen (NORCO/VICODIN) 5-325 MG tablet Take 1-2 tablets by mouth every 4 (four) hours as needed for moderate pain. 07/25/20  Yes Swayze, Ava, DO  levETIRAcetam (KEPPRA) 500 MG tablet Take 1 tablet (500 mg total) by mouth 2 (two) times daily. 12/18/18  Yes Sheikh, Omair Latif, DO  Multiple Vitamins-Minerals (ONE-A-DAY 50 PLUS PO) Take 1 tablet by mouth daily.   Yes [provider]  Omega-3 Fatty Acids (FISH OIL) 1000 MG CAPS Take 1,000 mg by mouth daily.   Yes [provider]  ondansetron (ZOFRAN-ODT) 8 MG disintegrating tablet Take 8 mg by mouth 3 (three) times daily. 07/12/20  Yes [provider]  risperiDONE  (RISPERDAL) 1 MG tablet Take 1 tablet (1 mg total) by mouth 2 (two) times daily. 11/18/19  Yes Sharma Covert, MD  tetrahydrozoline 0.05 % ophthalmic solution Place 2 drops into both eyes daily as needed.   Yes [provider]  traZODone (DESYREL) 50 MG tablet Take 1 tablet (50 mg total) by mouth at bedtime. 03/05/19  Yes Geradine Girt, DO  folic acid (FOLVITE) 1 MG tablet Take 1 tablet (1 mg total) by mouth daily. Patient not taking: Reported on 05/12/2020 12/19/18   Raiford Noble Latif, DO  buPROPion [facility] SR) 150 MG 12 hr tablet Take 150 mg by mouth 2 (two) times daily. Patient not taking: Reported on 05/12/2020 10/08/19 09/13/20  [provider]    Allergies    Patient has no known allergies.  Review of Systems   Review of Systems  Unable to perform ROS: Mental status change    Physical Exam Updated Vital Signs BP (!) 160/76   Pulse 69   Temp 100.2 F (37.9 C) (Oral)   Resp 15   Ht 5\' 6"  (1.676 m)   Wt 68 kg   SpO2 100%   BMI 24.21 kg/m   Physical Exam Vitals and nursing note reviewed.  Constitutional:      Appearance: Normal appearance. He is ill-appearing.  HENT:     Head: Normocephalic and atraumatic.     Right Ear: External ear normal.     Left Ear: External ear normal.     Nose: Nose normal.     Mouth/Throat:     Mouth: Mucous membranes are dry.  Eyes:     Extraocular Movements: Extraocular movements intact.     Conjunctiva/sclera: Conjunctivae normal.     Pupils: Pupils are equal, round, and reactive to light.  Cardiovascular:     Rate and Rhythm: Normal rate and regular rhythm.     Pulses: Normal pulses.     Heart sounds: Normal heart sounds.  Pulmonary:     Effort: Pulmonary effort is normal.     Breath sounds: Normal breath sounds.  Abdominal:     General: Abdomen is flat. Bowel sounds are normal.     Palpations: Abdomen is soft.  Musculoskeletal:        General: Normal range of motion.     Cervical back: Normal range of  motion and neck supple.  Skin:    General: Skin is warm.     Capillary Refill: Capillary refill takes less than 2 seconds.  Neurological:     Mental Status: He is lethargic and confused.     Comments: Left arm weakness (chronic from prior stroke)  Psychiatric:        Attention and  Perception: He perceives visual hallucinations.     ED Results / Procedures / Treatments   Labs (all labs ordered are listed, but only abnormal results are displayed) Labs Reviewed  CBC WITH DIFFERENTIAL/PLATELET - Abnormal; Notable for the following components:      Result Value   RBC 3.37 (*)    Hemoglobin 9.8 (*)    HCT 30.6 (*)    Monocytes Absolute 1.7 (*)    All other components within normal limits  COMPREHENSIVE METABOLIC PANEL - Abnormal; Notable for the following components:   Glucose, Bld 110 (*)    Creatinine, Ser 1.26 (*)    Calcium 8.3 (*)    Total Protein 6.1 (*)    Albumin 3.0 (*)    Alkaline Phosphatase 156 (*)    All other components within normal limits  URINALYSIS, ROUTINE W REFLEX MICROSCOPIC - Abnormal; Notable for the following components:   APPearance HAZY (*)    Hgb urine dipstick LARGE (*)    Ketones, ur 5 (*)    Protein, ur 100 (*)    Leukocytes,Ua TRACE (*)    RBC / HPF >50 (*)    All other components within normal limits  BLOOD GAS, ARTERIAL - Abnormal; Notable for the following components:   pH, Arterial 7.490 (*)    pCO2 arterial 31.5 (*)    All other components within normal limits  CBG MONITORING, ED - Abnormal; Notable for the following components:   Glucose-Capillary 107 (*)    All other components within normal limits  CULTURE, BLOOD (ROUTINE X 2)  CULTURE, BLOOD (ROUTINE X 2)  RESPIRATORY PANEL BY RT PCR (FLU A&B, COVID)  URINE CULTURE  LACTIC ACID, PLASMA  PROTIME-INR  CK  VALPROIC ACID LEVEL  AMMONIA    EKG EKG Interpretation  Date/Time:  Wednesday September 27 2020 15:44:20 EDT Ventricular Rate:  76 PR Interval:    QRS Duration: 86 QT  Interval:  398 QTC Calculation: 448 R Axis:   -18 Text Interpretation: Sinus rhythm Borderline left axis deviation No significant change since last tracing Confirmed by Isla Pence [phone]) on 09/27/2020 4:04:46 PM   Radiology DG Chest 2 View  Result Date: 09/27/2020 CLINICAL DATA:  Altered mental status EXAM: CHEST - 2 VIEW COMPARISON:  05/11/2020 FINDINGS: Low lung volumes. Scattered perihilar opacity. Stable cardiomediastinal silhouette with aortic atherosclerosis. No pneumothorax. IMPRESSION: Low lung volumes. Scattered perihilar opacity, possible atelectasis versus atypical infection. Electronically Signed   By: Donavan Foil M.D.   On: 09/27/2020 17:44   CT Head Wo Contrast  Result Date: 09/27/2020 CLINICAL DATA:  Mental status change EXAM: CT HEAD WITHOUT CONTRAST TECHNIQUE: Contiguous axial images were obtained from the base of the skull through the vertex without intravenous contrast. COMPARISON:  09/11/2020 FINDINGS: Brain: There is no acute intracranial hemorrhage or mass effect. No new loss of gray-white differentiation. Extensive right greater than left cerebral encephalomalacia. Small chronic infarcts of the left basal ganglia and adjacent white matter and bilateral cerebellum. Ventricles are stable in size. No extra-axial collection. Vascular: There is atherosclerotic calcification at the skull base. Skull: Calvarium is unremarkable. Sinuses/Orbits: No acute finding. Other: None. IMPRESSION: No acute intracranial abnormality. Stable chronic findings detailed above. Electronically Signed   By: Macy Mis M.D.   On: 09/27/2020 18:54    Procedures Procedures (including critical care time)  Medications Ordered in ED Medications  sodium chloride 0.9 % bolus 1,000 mL (0 mLs Intravenous Stopped 09/27/20 1927)  acetaminophen (TYLENOL) suppository 650 mg (650 mg Rectal Given  09/27/20 1630)  sodium chloride 0.9 % bolus 1,000 mL (0 mLs Intravenous Stopped 09/27/20 2105)    cefTRIAXone (ROCEPHIN) 1 g in sodium chloride 0.9 % 100 mL IVPB (0 g Intravenous Stopped 09/27/20 2105)    ED Course  I have reviewed the triage vital signs and the nursing notes.  Pertinent labs & imaging results that were available during my care of the patient were reviewed by me and considered in my medical decision making (see chart for details).    MDM Rules/Calculators/A&P                          Last urine culture sensitive to Rocephin.  Pt given a dose of rocephin in ED and urine sent for culture.    Pt still very encephalopathic.  Etiology unclear.  Likely multifactorial.  Pt d/w Dr. Olevia Bowens (triad) for admission.   Final Clinical Impression(s) / ED Diagnoses Final diagnoses:  Acute encephalopathy  Prostate cancer metastatic to multiple sites [facility])    Rx / [place] Orders ED Discharge Orders    None       Isla Pence, MD 09/27/20 2156

## 2020-09-27 NOTE — ED Triage Notes (Signed)
Pt seen by hospice home health today and instructed to come to ED. Pt was found in floor this morning. Pt was drooling and lethargic. Wife reported that he was not himself

## 2020-09-27 NOTE — ED Notes (Signed)
Patient not orientated to time or place at this time.

## 2020-09-27 NOTE — H&P (Signed)
History and Physical    Douglas Fowler DOB: November 16, 1966 DOA: 09/27/2020  PCP: Curly Rim, MD  Patient coming from: Home.  I have personally briefly reviewed patient's old medical records in [place]  Chief Complaint: Altered mental status.  HPI: Douglas Fowler is a 54 y.o. male with medical history significant of CAD, history of PCI with placement of 3 coronary stents, history of prostate cancer, history of multiple CVAs, hypertension, depression, history of chronic alcohol use, alcohol induced seizures, history of alcohol withdrawal  ED Course: Initial vital signs were temperature Temperature 100.2 F, pulse 80, respiration 18, blood pressure 138/82 mmHg O2 sat 97% on room air. The patient received 2000 mL of NS bolus, 650 mg of acetaminophen PR x1 and 1 g of ceftriaxone IVPB.  Labs: His urinalysis was hazy with large hemoglobinuria with more than 50 RBC per hpf on microscopic examination, ketonuria 5 and proteinuria 100 mg/dL. There was trace leukocyte esterase with 11-20 WBC per hpf. There was no bacteria seen. His CBC showed a white count of 8.4 with a normal differential, hemoglobin 9.8 g/dL and platelets 160. PT was 13.7 and INR 1.1. His hemoglobin level was 11.4 g/dL 16 days ago. Lactic acid was normal.   Imaging: CT head without contrast did not show any acute intracranial normality. There is extensive right greater than left cerebral encephalomalacia.  Review of Systems: As per HPI otherwise all other systems reviewed and are negative.  Past Medical History:  Diagnosis Date  . CAD (coronary artery disease)    s/p PCI with stents x 3  . Cancer [facility])    prostate  . Chronic alcohol abuse   . CVA (cerebral vascular accident) ([place])    multiple  . Hypertension   . Seizures ([place])   . Tobacco abuse     Past Surgical History:  Procedure Laterality Date  . CORONARY ANGIOPLASTY WITH STENT PLACEMENT     stents x 3  . CYSTOSCOPY WITH STENT PLACEMENT  Right 07/23/2020   Procedure: CYSTOSCOPY WITH RIGHT RETROGRADE AND STENT PLACEMENT, AND URETHRAL DILATION;  Surgeon: Lucas Mallow, MD;  Location: [facility];  Service: Urology;  Laterality: Right;    Social History  reports that he has been smoking. He has a 5.00 pack-year smoking history. He has never used smokeless tobacco. He reports previous alcohol use. He reports current drug use. Drug: Marijuana.  No Known Allergies  Family History  Family history unknown: Yes   Unable to obtain family history at this time.  Prior to Admission medications   Medication Sig Start Date End Date Taking? Authorizing Provider  atorvastatin (LIPITOR) 80 MG tablet Take 1 tablet (80 mg total) by mouth daily at 6 PM. 12/18/18  Yes Sheikh, Omair Latif, DO  clopidogrel (PLAVIX) 75 MG tablet Take 1 tablet (75 mg total) by mouth daily. 03/05/19  Yes Vann, Jessica U, DO  divalproex (DEPAKOTE) 500 MG DR tablet Take 500 mg by mouth 2 (two) times daily. 06/09/20  Yes [provider]  HYDROcodone-acetaminophen (NORCO/VICODIN) 5-325 MG tablet Take 1-2 tablets by mouth every 4 (four) hours as needed for moderate pain. 07/25/20  Yes Swayze, Ava, DO  levETIRAcetam (KEPPRA) 500 MG tablet Take 1 tablet (500 mg total) by mouth 2 (two) times daily. 12/18/18  Yes Sheikh, Omair Latif, DO  Multiple Vitamins-Minerals (ONE-A-DAY 50 PLUS PO) Take 1 tablet by mouth daily.   Yes [provider]  Omega-3 Fatty Acids (FISH OIL) 1000 MG CAPS Take  1,000 mg by mouth daily.   Yes [provider]  ondansetron (ZOFRAN-ODT) 8 MG disintegrating tablet Take 8 mg by mouth 3 (three) times daily. 07/12/20  Yes [provider]  risperiDONE (RISPERDAL) 1 MG tablet Take 1 tablet (1 mg total) by mouth 2 (two) times daily. 11/18/19  Yes Sharma Covert, MD  tetrahydrozoline 0.05 % ophthalmic solution Place 2 drops into both eyes daily as needed.   Yes [provider]  traZODone (DESYREL) 50 MG tablet Take 1  tablet (50 mg total) by mouth at bedtime. 03/05/19  Yes Geradine Girt, DO  folic acid (FOLVITE) 1 MG tablet Take 1 tablet (1 mg total) by mouth daily. Patient not taking: Reported on 05/12/2020 12/19/18   Raiford Noble Latif, DO  buPROPion [facility] SR) 150 MG 12 hr tablet Take 150 mg by mouth 2 (two) times daily. Patient not taking: Reported on 05/12/2020 10/08/19 09/13/20  [provider]   Physical Exam: Vitals:   09/27/20 1930 09/27/20 2000 09/27/20 2030 09/27/20 2209  BP: (!) 152/75 (!) 93/58 (!) 160/76 139/65  Pulse: 66 71 69 68  Resp:    20  Temp:      TempSrc:      SpO2: 99% 100% 100% 100%  Weight:      Height:       Constitutional: Looks chronically ill, but in NAD.  Nontoxic. Eyes: PERRL, lids and conjunctivae mildly injected. ENMT: Mucous membranes are dry.  Posterior pharynx clear of any exudate or lesions. Neck: normal, supple, no masses, no thyromegaly Respiratory: Decreased breath sounds in bases, otherwise clear to auscultation bilaterally, no wheezing, no crackles. Normal respiratory effort. No accessory muscle use.  Cardiovascular: Regular rate and rhythm, no murmurs / rubs / gallops. No extremity edema. 2+ pedal pulses. No carotid bruits.  Abdomen: Nondistended.  BS positive, soft, no tenderness, no masses palpated. No hepatosplenomegaly. Musculoskeletal: no clubbing / cyanosis.  Good ROM, no contractures. Normal muscle tone.  Skin: no rashes, lesions, ulcers on very limited dermatological examination. Neurologic: CN 2-12 grossly intact. Sensation intact, DTR normal. Strength 5/5 in all 4.  Psychiatric: Alert and oriented x 1, knows he is in the hospital, but unable to further elaborate.  He is disoriented to time, date and situation. Normal mood.   Labs on Admission: I have personally reviewed following labs and imaging studies  CBC: Recent Labs  Lab 09/27/20 1615  WBC 8.4  NEUTROABS 5.6  HGB 9.8*  HCT 30.6*  MCV 90.8  PLT 007    Basic Metabolic  Panel: Recent Labs  Lab 09/27/20 1615  NA 137  K 3.9  CL 102  CO2 26  GLUCOSE 110*  BUN 17  CREATININE 1.26*  CALCIUM 8.3*    GFR: Estimated Creatinine Clearance: 59.1 mL/min (A) (by C-G formula based on SCr of 1.26 mg/dL (H)).  Liver Function Tests: Recent Labs  Lab 09/27/20 1615  AST 18  ALT 14  ALKPHOS 156*  BILITOT 0.5  PROT 6.1*  ALBUMIN 3.0*    Urine analysis:    Component Value Date/Time   COLORURINE YELLOW 09/27/2020 1533   APPEARANCEUR HAZY (A) 09/27/2020 1533   LABSPEC 1.016 09/27/2020 1533   PHURINE 8.0 09/27/2020 1533   GLUCOSEU NEGATIVE 09/27/2020 1533   HGBUR LARGE (A) 09/27/2020 1533   BILIRUBINUR NEGATIVE 09/27/2020 1533   KETONESUR 5 (A) 09/27/2020 1533   PROTEINUR 100 (A) 09/27/2020 1533   NITRITE NEGATIVE 09/27/2020 1533   LEUKOCYTESUR TRACE (A) 09/27/2020 1533  Radiological Exams on Admission: DG Chest 2 View  Result Date: 09/27/2020 CLINICAL DATA:  Altered mental status EXAM: CHEST - 2 VIEW COMPARISON:  05/11/2020 FINDINGS: Low lung volumes. Scattered perihilar opacity. Stable cardiomediastinal silhouette with aortic atherosclerosis. No pneumothorax. IMPRESSION: Low lung volumes. Scattered perihilar opacity, possible atelectasis versus atypical infection. Electronically Signed   By: Donavan Foil M.D.   On: 09/27/2020 17:44   CT Head Wo Contrast  Result Date: 09/27/2020 CLINICAL DATA:  Mental status change EXAM: CT HEAD WITHOUT CONTRAST TECHNIQUE: Contiguous axial images were obtained from the base of the skull through the vertex without intravenous contrast. COMPARISON:  09/11/2020 FINDINGS: Brain: There is no acute intracranial hemorrhage or mass effect. No new loss of gray-white differentiation. Extensive right greater than left cerebral encephalomalacia. Small chronic infarcts of the left basal ganglia and adjacent white matter and bilateral cerebellum. Ventricles are stable in size. No extra-axial collection. Vascular: There is  atherosclerotic calcification at the skull base. Skull: Calvarium is unremarkable. Sinuses/Orbits: No acute finding. Other: None. IMPRESSION: No acute intracranial abnormality. Stable chronic findings detailed above. Electronically Signed   By: Macy Mis M.D.   On: 09/27/2020 18:54   03/01/2019 echocardiogram  IMPRESSIONS   1. The left ventricle has normal systolic function, with an ejection  fraction of 55-60%. The cavity size was normal. Left ventricular diastolic  parameters were normal.  2. The right ventricle has normal systolic function. The cavity was  normal. There is no increase in right ventricular wall thickness.  3. Mild thickening of the mitral valve leaflet.  4. The aortic valve is tricuspid. Mild thickening of the aortic valve.  Mild calcification of the aortic valve. Aortic valve regurgitation is  moderate by color flow Doppler.   EKG: Independently reviewed.  Vent. rate 76 BPM PR interval * ms QRS duration 86 ms QT/QTc 398/448 ms P-R-T axes 34 -18 66 Sinus rhythm Borderline left axis deviation  Assessment/Plan Principal Problem:   Acute encephalopathy No clear etiology. Medications? Urinalysis shows no bacteria or casts. Will defer further antibiotic at this time. Follow blood culture and sensitivity.  Active Problems:   Essential hypertension  Not on antihypertensives. Monitor blood pressure. Antihypertensive as needed.    CAD (coronary artery disease) Continue clopidogrel 75 mg p.o. daily. Continue atorvastatin 80 mg p.o. daily.    Tobacco abuse Nicotine replacement therapy as needed.    MDD (major depressive disorder) Holding risperidone and trazodone due to AMS.    Seizures (HCC) Continue Keppra 500 mg p.o. twice daily. Continue Depakote 500 mg p.o. twice daily.    History of alcohol abuse Monitor for signs of DTs.    DVT prophylaxis: SCDs. Code Status:   Full code. Family Communication:   Disposition Plan:   Patient is  from:  Home.  Anticipated DC to:  Home.  Anticipated DC date:  09/28/2020 or 09/29/2020.  Anticipated DC barriers: Clinical status.  Consults called: Admission status:  Observation/telemetry.   Severity of Illness: High due to altered mental status without a clear etiology.  Reubin Milan MD [facility]  How to contact the [facility] Attending or Consulting provider [place] or covering provider during after hours [place], for this patient?   1. Check the care team in [facility] and look for a) attending/consulting [facility] provider listed and b) the [facility] team listed 2. Log into [email or web address] and use [place]'s universal password to access. If you do not have the password, please contact the hospital operator. 3. Locate the [facility]  provider you are looking for under [facility] and page to a number that you can be directly reached. 4. If you still have difficulty reaching the provider, please page the [facility] (Director on Call) for the Hospitalists listed on amion for assistance.  09/27/2020, 11:13 PM   This document was prepared using Dragon voice recognition software and may contain some unintended transcription errors.

## 2020-09-27 NOTE — ED Notes (Signed)
Pt reports feeling dizzy

## 2020-09-28 DIAGNOSIS — G9340 Encephalopathy, unspecified: Secondary | ICD-10-CM | POA: Diagnosis present

## 2020-09-28 DIAGNOSIS — D649 Anemia, unspecified: Secondary | ICD-10-CM | POA: Diagnosis present

## 2020-09-28 DIAGNOSIS — G9389 Other specified disorders of brain: Secondary | ICD-10-CM | POA: Diagnosis present

## 2020-09-28 DIAGNOSIS — R569 Unspecified convulsions: Secondary | ICD-10-CM

## 2020-09-28 DIAGNOSIS — F17210 Nicotine dependence, cigarettes, uncomplicated: Secondary | ICD-10-CM | POA: Diagnosis present

## 2020-09-28 DIAGNOSIS — I69311 Memory deficit following cerebral infarction: Secondary | ICD-10-CM | POA: Diagnosis not present

## 2020-09-28 DIAGNOSIS — R627 Adult failure to thrive: Secondary | ICD-10-CM

## 2020-09-28 DIAGNOSIS — J189 Pneumonia, unspecified organism: Secondary | ICD-10-CM | POA: Diagnosis present

## 2020-09-28 DIAGNOSIS — I252 Old myocardial infarction: Secondary | ICD-10-CM | POA: Diagnosis not present

## 2020-09-28 DIAGNOSIS — Z66 Do not resuscitate: Secondary | ICD-10-CM

## 2020-09-28 DIAGNOSIS — E871 Hypo-osmolality and hyponatremia: Secondary | ICD-10-CM | POA: Diagnosis present

## 2020-09-28 DIAGNOSIS — R296 Repeated falls: Secondary | ICD-10-CM | POA: Diagnosis present

## 2020-09-28 DIAGNOSIS — Z515 Encounter for palliative care: Secondary | ICD-10-CM | POA: Diagnosis not present

## 2020-09-28 DIAGNOSIS — F329 Major depressive disorder, single episode, unspecified: Secondary | ICD-10-CM | POA: Diagnosis present

## 2020-09-28 DIAGNOSIS — T83511A Infection and inflammatory reaction due to indwelling urethral catheter, initial encounter: Secondary | ICD-10-CM | POA: Diagnosis present

## 2020-09-28 DIAGNOSIS — E860 Dehydration: Secondary | ICD-10-CM | POA: Diagnosis present

## 2020-09-28 DIAGNOSIS — C7951 Secondary malignant neoplasm of bone: Secondary | ICD-10-CM | POA: Diagnosis present

## 2020-09-28 DIAGNOSIS — C61 Malignant neoplasm of prostate: Secondary | ICD-10-CM | POA: Diagnosis present

## 2020-09-28 DIAGNOSIS — I10 Essential (primary) hypertension: Secondary | ICD-10-CM | POA: Diagnosis present

## 2020-09-28 DIAGNOSIS — F0390 Unspecified dementia without behavioral disturbance: Secondary | ICD-10-CM | POA: Diagnosis present

## 2020-09-28 DIAGNOSIS — I2510 Atherosclerotic heart disease of native coronary artery without angina pectoris: Secondary | ICD-10-CM | POA: Diagnosis present

## 2020-09-28 DIAGNOSIS — N390 Urinary tract infection, site not specified: Secondary | ICD-10-CM | POA: Diagnosis present

## 2020-09-28 DIAGNOSIS — Y846 Urinary catheterization as the cause of abnormal reaction of the patient, or of later complication, without mention of misadventure at the time of the procedure: Secondary | ICD-10-CM | POA: Diagnosis present

## 2020-09-28 DIAGNOSIS — Z955 Presence of coronary angioplasty implant and graft: Secondary | ICD-10-CM | POA: Diagnosis not present

## 2020-09-28 DIAGNOSIS — F419 Anxiety disorder, unspecified: Secondary | ICD-10-CM | POA: Diagnosis present

## 2020-09-28 DIAGNOSIS — G9341 Metabolic encephalopathy: Secondary | ICD-10-CM | POA: Diagnosis present

## 2020-09-28 DIAGNOSIS — Z7189 Other specified counseling: Secondary | ICD-10-CM

## 2020-09-28 DIAGNOSIS — Z20822 Contact with and (suspected) exposure to covid-19: Secondary | ICD-10-CM | POA: Diagnosis present

## 2020-09-28 LAB — CBC
HCT: 28.7 % — ABNORMAL LOW (ref 39.0–52.0)
Hemoglobin: 9.5 g/dL — ABNORMAL LOW (ref 13.0–17.0)
MCH: 29.7 pg (ref 26.0–34.0)
MCHC: 33.1 g/dL (ref 30.0–36.0)
MCV: 89.7 fL (ref 80.0–100.0)
Platelets: 127 10*3/uL — ABNORMAL LOW (ref 150–400)
RBC: 3.2 MIL/uL — ABNORMAL LOW (ref 4.22–5.81)
RDW: 14.8 % (ref 11.5–15.5)
WBC: 9 10*3/uL (ref 4.0–10.5)
nRBC: 0 % (ref 0.0–0.2)

## 2020-09-28 LAB — COMPREHENSIVE METABOLIC PANEL
ALT: 15 U/L (ref 0–44)
AST: 27 U/L (ref 15–41)
Albumin: 2.6 g/dL — ABNORMAL LOW (ref 3.5–5.0)
Alkaline Phosphatase: 126 U/L (ref 38–126)
Anion gap: 9 (ref 5–15)
BUN: 13 mg/dL (ref 6–20)
CO2: 23 mmol/L (ref 22–32)
Calcium: 7.7 mg/dL — ABNORMAL LOW (ref 8.9–10.3)
Chloride: 100 mmol/L (ref 98–111)
Creatinine, Ser: 1.2 mg/dL (ref 0.61–1.24)
GFR, Estimated: >60 mL/min (ref >60)
Glucose, Bld: 108 mg/dL — ABNORMAL HIGH (ref 70–99)
Potassium: 3.8 mmol/L (ref 3.5–5.1)
Sodium: 132 mmol/L — ABNORMAL LOW (ref 135–145)
Total Bilirubin: 0.4 mg/dL (ref 0.3–1.2)
Total Protein: 5.6 g/dL — ABNORMAL LOW (ref 6.5–8.1)

## 2020-09-28 LAB — ETHANOL: Alcohol, Ethyl (B): <10 mg/dL (ref <10)

## 2020-09-28 LAB — RESPIRATORY PANEL BY RT PCR (FLU A&B, COVID)
Influenza A by PCR: NEGATIVE
Influenza B by PCR: NEGATIVE
SARS Coronavirus 2 by RT PCR: NEGATIVE

## 2020-09-28 LAB — MAGNESIUM: Magnesium: 1.7 mg/dL (ref 1.7–2.4)

## 2020-09-28 LAB — GLUCOSE, CAPILLARY
Glucose-Capillary: 109 mg/dL — ABNORMAL HIGH (ref 70–99)
Glucose-Capillary: 115 mg/dL — ABNORMAL HIGH (ref 70–99)

## 2020-09-28 MED ORDER — LEVETIRACETAM 500 MG PO TABS
500.0000 mg | ORAL_TABLET | Freq: Two times a day (BID) | ORAL | Status: DC
  Administered 2020-09-28 – 2020-09-29 (×4): 500 mg via ORAL
  Filled 2020-09-28 (×4): qty 1

## 2020-09-28 MED ORDER — CHLORHEXIDINE GLUCONATE CLOTH 2 % EX PADS
6.0000 | MEDICATED_PAD | Freq: Every day | CUTANEOUS | Status: DC
  Administered 2020-09-29: 6 via TOPICAL

## 2020-09-28 MED ORDER — DIVALPROEX SODIUM 250 MG PO DR TAB
500.0000 mg | DELAYED_RELEASE_TABLET | Freq: Two times a day (BID) | ORAL | Status: DC
  Administered 2020-09-28 – 2020-09-29 (×4): 500 mg via ORAL
  Filled 2020-09-28 (×4): qty 2

## 2020-09-28 MED ORDER — GLYCOPYRROLATE 0.2 MG/ML IJ SOLN: 0.2000 mg | INTRAMUSCULAR | Status: DC | PRN

## 2020-09-28 MED ORDER — ATORVASTATIN CALCIUM 40 MG PO TABS: 80.0000 mg | ORAL_TABLET | Freq: Every day | ORAL | Status: DC

## 2020-09-28 MED ORDER — CLOPIDOGREL BISULFATE 75 MG PO TABS
75.0000 mg | ORAL_TABLET | Freq: Every day | ORAL | Status: DC
  Administered 2020-09-28: 75 mg via ORAL
  Filled 2020-09-28: qty 1

## 2020-09-28 MED ORDER — OXYCODONE HCL 5 MG PO TABS
5.0000 mg | ORAL_TABLET | ORAL | Status: DC | PRN
  Administered 2020-09-28 – 2020-09-29 (×3): 5 mg via ORAL
  Filled 2020-09-28 (×3): qty 1

## 2020-09-28 MED ORDER — MORPHINE SULFATE (PF) 2 MG/ML IV SOLN: 2.0000 mg | INTRAVENOUS | Status: DC | PRN

## 2020-09-28 MED ORDER — HALOPERIDOL LACTATE 5 MG/ML IJ SOLN: 2.0000 mg | Freq: Four times a day (QID) | INTRAMUSCULAR | Status: DC | PRN

## 2020-09-28 MED ORDER — DEXTROSE-NACL 5-0.45 % IV SOLN: INTRAVENOUS | Status: DC

## 2020-09-28 MED ORDER — SODIUM CHLORIDE 0.9 % IV SOLN
500.0000 mg | INTRAVENOUS | Status: DC
  Administered 2020-09-28 – 2020-09-29 (×2): 500 mg via INTRAVENOUS
  Filled 2020-09-28 (×2): qty 500

## 2020-09-28 MED ORDER — LORAZEPAM 2 MG/ML IJ SOLN: 1.0000 mg | INTRAMUSCULAR | Status: DC | PRN

## 2020-09-28 MED ORDER — SODIUM CHLORIDE 0.9 % IV SOLN
1.0000 g | INTRAVENOUS | Status: DC
  Administered 2020-09-28 – 2020-09-29 (×2): 1 g via INTRAVENOUS
  Filled 2020-09-28 (×2): qty 10

## 2020-09-28 MED ORDER — LOPERAMIDE HCL 2 MG PO CAPS: 2.0000 mg | ORAL_CAPSULE | Freq: Four times a day (QID) | ORAL | Status: DC | PRN

## 2020-09-28 NOTE — TOC Progression Note (Signed)
TOC received consult for [facility] referral. Referral called to Cassandra at [facility]. They will review pt and make determination tomorrow AM.

## 2020-09-28 NOTE — Progress Notes (Signed)
Patient Demographics:    Douglas Fowler, is a 54 y.o. male, DOB - 1966-06-19, ZOX:[id number]  Admit date - 09/27/2020   Admitting Physician Rether Rison Denton Brick, MD  Outpatient Primary MD for the patient is Corrington, Kip A, MD  LOS - 0   Chief Complaint  Patient presents with  . Altered Mental Status        Subjective:    Douglas Fowler today has no fevers, no emesis,  No chest pain,   -Patient with cough, low-grade fevers, confusion, and loose stools =-COVID-19 test negative twice  Assessment  & Plan :    Principal Problem:   Acute encephalopathy Active Problems:   Essential hypertension   CAD (coronary artery disease)   Tobacco abuse   MDD (major depressive disorder)   Seizures (HCC)   History of alcohol abuse   Acute metabolic encephalopathy  Brief Summary:- 54 y.o. male with medical history significant of CAD, history of PCI with placement of 3 coronary stents, history of prostate cancer, history of multiple CVAs, hypertension, depression, history of chronic alcohol use, alcohol induced seizures in the past admitted on 09/21/2020 with altered mentation and concerns for pneumonia and UTI  A/p  1) acute metabolic encephalopathy superimposed on underlying dementia in a patient with history of heavy alcohol use previously--- -treat for suspected pneumonia and UTI with IV Rocephin and azithromycin -May use as needed lorazepam as needed Haldol -Anticipate improvement in mental status with improvement in suspected infection -According to patient's wife patient's current mental status is drastically different from his baseline (patient is a lot more lethargic with episodes of agitation and combativeness which according to patient's wife is not usual for him)  2)CAP--- patient with cough, low-grade temp and chest x-ray findings suggestive of possible pneumonia -Patient does not meet sepsis  criteria -Treat empirically with IV Rocephin and azithromycin pending further data -COVID-19 test negative x2  3)Possible UTI--- probably contributing to #1 above, treat empirically with IV Rocephin pending further culture data  5)History of prostate CA--with bone mass, patient currently receiving hormonal therapy every 6 months last treatment was 08/24/2020-  6)Social/Ethics--palliative care consult appreciated, patient is a DNR/DNI  7)  dementia--patient with  baseline cognitive and  memory deficits, -Etiology of dementia is probably a combination of heavy alcohol use for prolonged period of time, history of recurrent alcohol-related withdrawal seizures as well as history of multiple CVAs with possible vascular component  --continue Aricept, continue risperidone and trazodone  8) history of CAD--- prior history of angioplasty and stenting, no ACS type symptoms at this time -Continue Plavix and Lipitor  9)Seizure-mostly related to alcohol withdrawal previously, continue Keppra and Depakote  10) chronic anemia--hemoglobin down to 9.5 with hydration, suspect anemia is probably neoplastic, and partly nutritional -No evidence of ongoing bleeding at this time continue to monitor   11)-hyponatremia--sodium is down to 132 suspect due to dehydration in the setting of diarrhea and poor oral intake, hydrate IV and orally  Disposition/Need for in-Hospital Stay- patient unable to be discharged at this time due to --possible pneumonia and UTI requiring IV antibiotics, dehydration requiring IV fluids  Status is: Inpatient  Remains inpatient appropriate because:possible pneumonia and UTI requiring IV antibiotics, dehydration requiring IV fluids   Disposition: The patient is from:  Home              Anticipated d/c is to: Home              Anticipated d/c date is: 2 days              Patient currently is not medically stable to d/c. Barriers: Not Clinically Stable- possible pneumonia and UTI  requiring IV antibiotics, dehydration requiring IV fluids  Code Status : DNR  Family Communication:   Discussed with wife Consults  :  Palliative  DVT Prophylaxis  :   - heparin/SCDs   Lab Results  Component Value Date   PLT 127 (L) 09/28/2020    Inpatient Medications  Scheduled Meds: . atorvastatin  80 mg Oral q1800  . Chlorhexidine Gluconate Cloth  6 each Topical Daily  . clopidogrel  75 mg Oral Daily  . divalproex  500 mg Oral BID  . levETIRAcetam  500 mg Oral BID   Continuous Infusions: . azithromycin Stopped (09/28/20 1138)  . cefTRIAXone (ROCEPHIN)  IV Stopped (09/28/20 4481)  . dextrose 5 % and 0.45% NaCl 100 mL/hr at 09/28/20 0915   PRN Meds:.acetaminophen **OR** acetaminophen, loperamide, ondansetron **OR** ondansetron (ZOFRAN) IV    Anti-infectives (From admission, onward)   Start     Dose/Rate Route Frequency Ordered Stop   09/28/20 0830  cefTRIAXone (ROCEPHIN) 1 g in sodium chloride 0.9 % 100 mL IVPB        1 g 200 mL/hr over 30 Minutes Intravenous Every 24 hours 09/28/20 0824     09/28/20 0830  azithromycin (ZITHROMAX) 500 mg in sodium chloride 0.9 % 250 mL IVPB        500 mg 250 mL/hr over 60 Minutes Intravenous Every 24 hours 09/28/20 0824     09/27/20 1945  cefTRIAXone (ROCEPHIN) 1 g in sodium chloride 0.9 % 100 mL IVPB        1 g 200 mL/hr over 30 Minutes Intravenous  Once 09/27/20 1930 09/27/20 2105        Objective:   Vitals:   09/28/20 1230 09/28/20 1300 09/28/20 1334 09/28/20 1355  BP: 118/72 (!) 117/58  137/69  Pulse: 66 65  65  Resp: 20 (!) 21  20  Temp:   98.9 F (37.2 C) 98.8 F (37.1 C)  TempSrc:   Oral Oral  SpO2: 93% 95%  96%  Weight:      Height:        Wt Readings from Last 3 Encounters:  09/27/20 68 kg  09/13/20 68 kg  09/11/20 68 kg     Intake/Output Summary (Last 24 hours) at 09/28/2020 1620 Last data filed at 09/28/2020 1531 Gross per 24 hour  Intake 3440 ml  Output 2820 ml  Net 620 ml     Physical  Exam  Gen:-Resting, in no apparent distress  HEENT:- [place].AT, No sclera icterus Neck-Supple Neck,No JVD,.  Lungs-diminished breath sounds, no wheezing  CV- S1, S2 normal, regular  Abd-  +ve B.Sounds, Abd Soft, No tenderness,    Extremity/Skin:- No  edema, pedal pulses present  Psych-affect is flat, patient with disorientation and cognitive and memory deficits,  Neuro-generalized weakness, no new focal deficits, no tremors   Data Review:   Micro Results Recent Results (from the past 240 hour(s))  Culture, blood (routine x 2)     Status: None (Preliminary result)   Collection Time: 09/27/20  4:16 PM   Specimen: Left Antecubital; Blood  Result Value Ref Range Status  Specimen Description LEFT ANTECUBITAL  Final   Special Requests   Final    BOTTLES DRAWN AEROBIC AND ANAEROBIC Blood Culture adequate volume   Culture   Final    NO GROWTH < 24 HOURS Performed at [facility], [address], [place], [place] [address]    Report Status PENDING  Incomplete  Respiratory Panel by RT PCR (Flu A&B, Covid) - Nasopharyngeal Swab     Status: None   Collection Time: 09/27/20  5:06 PM   Specimen: Nasopharyngeal Swab  Result Value Ref Range Status   SARS Coronavirus 2 by RT PCR NEGATIVE NEGATIVE Final    Comment: (NOTE) SARS-CoV-2 target nucleic acids are NOT DETECTED.  The SARS-CoV-2 RNA is generally detectable in upper respiratoy specimens during the acute phase of infection. The lowest concentration of SARS-CoV-2 viral copies this assay can detect is 131 copies/mL. A negative result does not preclude SARS-Cov-2 infection and should not be used as the sole basis for treatment or other patient management decisions. A negative result may occur with  improper specimen collection/handling, submission of specimen other than nasopharyngeal swab, presence of viral mutation(s) within the areas targeted by this assay, and inadequate number of viral copies (<131 copies/mL). A negative result must be  combined with clinical observations, patient history, and epidemiological information. The expected result is Negative.  Fact Sheet for Patients:  PinkCheek.be  Fact Sheet for Healthcare Providers:  GravelBags.it  This test is no t yet approved or cleared by the [place] FDA and  has been authorized for detection and/or diagnosis of SARS-CoV-2 by FDA under an Emergency Use Authorization (EUA). This EUA will remain  in effect (meaning this test can be used) for the duration of the COVID-19 declaration under Section 564(b)(1) of the Act, 21 U.S.C. section 360bbb-3(b)(1), unless the authorization is terminated or revoked sooner.     Influenza A by PCR NEGATIVE NEGATIVE Final   Influenza B by PCR NEGATIVE NEGATIVE Final    Comment: (NOTE) The Xpert Xpress SARS-CoV-2/FLU/RSV assay is intended as an aid in  the diagnosis of influenza from Nasopharyngeal swab specimens and  should not be used as a sole basis for treatment. Nasal washings and  aspirates are unacceptable for Xpert Xpress SARS-CoV-2/FLU/RSV  testing.  Fact Sheet for Patients: PinkCheek.be  Fact Sheet for Healthcare Providers: GravelBags.it  This test is not yet approved or cleared by the [place] FDA and  has been authorized for detection and/or diagnosis of SARS-CoV-2 by  FDA under an Emergency Use Authorization (EUA). This EUA will remain  in effect (meaning this test can be used) for the duration of the  Covid-19 declaration under Section 564(b)(1) of the Act, 21  U.S.C. section 360bbb-3(b)(1), unless the authorization is  terminated or revoked. Performed at [facility], [address] [place], [place] [address]   Culture, blood (routine x 2)     Status: None (Preliminary result)   Collection Time: 09/27/20  8:28 PM   Specimen: BLOOD RIGHT HAND  Result Value Ref Range Status   Specimen  Description BLOOD RIGHT HAND  Final   Special Requests   Final    BOTTLES DRAWN AEROBIC AND ANAEROBIC Blood Culture adequate volume   Culture   Final    NO GROWTH < 12 HOURS Performed at [facility], [address], [place], [place] [address]    Report Status PENDING  Incomplete  Respiratory Panel by RT PCR (Flu A&B, Covid) - Nasopharyngeal Swab     Status: None  Collection Time: 09/28/20  2:23 PM   Specimen: Nasopharyngeal Swab  Result Value Ref Range Status   SARS Coronavirus 2 by RT PCR NEGATIVE NEGATIVE Final    Comment: (NOTE) SARS-CoV-2 target nucleic acids are NOT DETECTED.  The SARS-CoV-2 RNA is generally detectable in upper respiratoy specimens during the acute phase of infection. The lowest concentration of SARS-CoV-2 viral copies this assay can detect is 131 copies/mL. A negative result does not preclude SARS-Cov-2 infection and should not be used as the sole basis for treatment or other patient management decisions. A negative result may occur with  improper specimen collection/handling, submission of specimen other than nasopharyngeal swab, presence of viral mutation(s) within the areas targeted by this assay, and inadequate number of viral copies (<131 copies/mL). A negative result must be combined with clinical observations, patient history, and epidemiological information. The expected result is Negative.  Fact Sheet for Patients:  PinkCheek.be  Fact Sheet for Healthcare Providers:  GravelBags.it  This test is no t yet approved or cleared by the [place] FDA and  has been authorized for detection and/or diagnosis of SARS-CoV-2 by FDA under an Emergency Use Authorization (EUA). This EUA will remain  in effect (meaning this test can be used) for the duration of the COVID-19 declaration under Section 564(b)(1) of the Act, 21 U.S.C. section 360bbb-3(b)(1), unless the authorization is terminated  or revoked sooner.     Influenza A by PCR NEGATIVE NEGATIVE Final   Influenza B by PCR NEGATIVE NEGATIVE Final    Comment: (NOTE) The Xpert Xpress SARS-CoV-2/FLU/RSV assay is intended as an aid in  the diagnosis of influenza from Nasopharyngeal swab specimens and  should not be used as a sole basis for treatment. Nasal washings and  aspirates are unacceptable for Xpert Xpress SARS-CoV-2/FLU/RSV  testing.  Fact Sheet for Patients: PinkCheek.be  Fact Sheet for Healthcare Providers: GravelBags.it  This test is not yet approved or cleared by the [place] FDA and  has been authorized for detection and/or diagnosis of SARS-CoV-2 by  FDA under an Emergency Use Authorization (EUA). This EUA will remain  in effect (meaning this test can be used) for the duration of the  Covid-19 declaration under Section 564(b)(1) of the Act, 21  U.S.C. section 360bbb-3(b)(1), unless the authorization is  terminated or revoked. Performed at [facility], [address] [place], [place] [address]     Radiology Reports DG Chest 2 View  Result Date: 09/27/2020 CLINICAL DATA:  Altered mental status EXAM: CHEST - 2 VIEW COMPARISON:  05/11/2020 FINDINGS: Low lung volumes. Scattered perihilar opacity. Stable cardiomediastinal silhouette with aortic atherosclerosis. No pneumothorax. IMPRESSION: Low lung volumes. Scattered perihilar opacity, possible atelectasis versus atypical infection. Electronically Signed   By: Donavan Foil M.D.   On: 09/27/2020 17:44   CT Head Wo Contrast  Result Date: 09/27/2020 CLINICAL DATA:  Mental status change EXAM: CT HEAD WITHOUT CONTRAST TECHNIQUE: Contiguous axial images were obtained from the base of the skull through the vertex without intravenous contrast. COMPARISON:  09/11/2020 FINDINGS: Brain: There is no acute intracranial hemorrhage or mass effect. No new loss of gray-white differentiation. Extensive right  greater than left cerebral encephalomalacia. Small chronic infarcts of the left basal ganglia and adjacent white matter and bilateral cerebellum. Ventricles are stable in size. No extra-axial collection. Vascular: There is atherosclerotic calcification at the skull base. Skull: Calvarium is unremarkable. Sinuses/Orbits: No acute finding. Other: None. IMPRESSION: No acute intracranial abnormality. Stable chronic findings detailed above. Electronically Signed   By: Malachi Carl  Patel M.D.   On: 09/27/2020 18:54   CT Head Wo Contrast  Result Date: 09/11/2020 CLINICAL DATA:  Mental status change.  Metastatic prostate cancer EXAM: CT HEAD WITHOUT CONTRAST TECHNIQUE: Contiguous axial images were obtained from the base of the skull through the vertex without intravenous contrast. COMPARISON:  CT head 05/11/2020 FINDINGS: Brain: Extensive encephalomalacia right MCA territory compatible chronic infarct unchanged. Also noted chronic infarcts in the left frontal lobe, left occipital parietal lobe, and left internal capsule unchanged. No new area of infarct, hemorrhage, mass when compared to the prior study. Vascular: Negative for hyperdense vessel Skull: Negative Sinuses/Orbits: Mild mucosal edema paranasal sinuses. Negative orbit Other: None IMPRESSION: Chronic infarcts bilaterally. No acute infarct and no change from the prior study. Electronically Signed   By: Franchot Gallo M.D.   On: 09/11/2020 11:56     CBC Recent Labs  Lab 09/27/20 1615 09/28/20 1038  WBC 8.4 9.0  HGB 9.8* 9.5*  HCT 30.6* 28.7*  PLT 160 127*  MCV 90.8 89.7  MCH 29.1 29.7  MCHC 32.0 33.1  RDW 15.1 14.8  LYMPHSABS 1.0  --   MONOABS 1.7*  --   EOSABS 0.0  --   BASOSABS 0.0  --     Chemistries  Recent Labs  Lab 09/27/20 1615 09/27/20 2028 09/28/20 1038  NA 137  --  132*  K 3.9  --  3.8  CL 102  --  100  CO2 26  --  23  GLUCOSE 110*  --  108*  BUN 17  --  13  CREATININE 1.26*  --  1.20  CALCIUM 8.3*  --  7.7*  MG  --  1.7   --   AST 18  --  27  ALT 14  --  15  ALKPHOS 156*  --  126  BILITOT 0.5  --  0.4   ------------------------------------------------------------------------------------------------------------------ No results for input(s): CHOL, HDL, LDLCALC, TRIG, CHOLHDL, LDLDIRECT in the last 72 hours.  Lab Results  Component Value Date   HGBA1C 5.2 03/02/2019   ------------------------------------------------------------------------------------------------------------------ No results for input(s): TSH, T4TOTAL, T3FREE, THYROIDAB in the last 72 hours.  Invalid input(s): FREET3 ------------------------------------------------------------------------------------------------------------------ No results for input(s): VITAMINB12, FOLATE, FERRITIN, TIBC, IRON, RETICCTPCT in the last 72 hours.  Coagulation profile Recent Labs  Lab 09/27/20 1615  INR 1.1    No results for input(s): DDIMER in the last 72 hours.  Cardiac Enzymes No results for input(s): CKMB, TROPONINI, MYOGLOBIN in the last 168 hours.  Invalid input(s): CK ------------------------------------------------------------------------------------------------------------------ No results found for: BNP   Roxan Hockey M.D on 09/28/2020 at 4:20 PM  Go to [email or web address] - for contact info  [facility] - Office  [phone]
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
## 2020-09-28 NOTE — Progress Notes (Signed)
 -  After further conversations with per palliative care team--family has decided to transition to full comfort care, DNR status,  -Plan is to transition to residential hospice when bed is available  Roxan Hockey, MD
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
## 2020-09-28 NOTE — Plan of Care (Signed)
Patient arrived to unit full code. Shortly after being here, I received call from palliative care and she had spoken with spouse. Decision made to make comfort measures. Patient is confused. Foley catheter in place. Blood glucose checked. Patient oriented to unit and call bell. Call bell within reach and bed alarm on.

## 2020-09-28 NOTE — ED Notes (Signed)
Juice given per patient request

## 2020-09-28 NOTE — ED Notes (Signed)
Call to wife to give update but no answer. Will try again

## 2020-09-29 LAB — GLUCOSE, CAPILLARY: Glucose-Capillary: 97 mg/dL (ref 70–99)

## 2020-09-29 NOTE — Discharge Summary (Signed)
Douglas Fowler, is a 54 y.o. male  DOB May 11, 1966  MRN [id number].  Admission date:  09/27/2020  Admitting Physician  Roxan Hockey, MD  Discharge Date:  09/29/2020   Primary MD  Corrington, Delsa Grana, MD  Recommendations for primary care physician for things to follow:   -Please transfer to residential hospice with full comfort care  Admission Diagnosis  Acute encephalopathy [G93.40] Prostate cancer metastatic to multiple sites [facility]) [C61] Dyspnea and respiratory abnormalities [R06.00, T46.56] Acute metabolic encephalopathy [C12.75]   Discharge Diagnosis  Acute encephalopathy [G93.40] Prostate cancer metastatic to multiple sites [facility]) [C61] Dyspnea and respiratory abnormalities [R06.00, T70.01] Acute metabolic encephalopathy [V49.44]    Principal Problem:   Encephalomalacia Active Problems:   Essential hypertension   CAD (coronary artery disease)   Tobacco abuse   MDD (major depressive disorder)   Seizures ([place])   History of alcohol abuse   Acute metabolic encephalopathy   Prostate cancer metastatic to multiple sites [facility])   Adult failure to thrive   Palliative care by specialist   Goals of care, counseling/discussion   DNR (do not resuscitate)      Past Medical History:  Diagnosis Date   CAD (coronary artery disease)    s/p PCI with stents x 3   Cancer ([place])    prostate   Chronic alcohol abuse    CVA (cerebral vascular accident) ([place])    multiple   Hypertension    Seizures ([place])    Tobacco abuse     Past Surgical History:  Procedure Laterality Date   CORONARY ANGIOPLASTY WITH STENT PLACEMENT     stents x 3   CYSTOSCOPY WITH STENT PLACEMENT Right 07/23/2020   Procedure: CYSTOSCOPY WITH RIGHT RETROGRADE AND STENT PLACEMENT, AND URETHRAL DILATION;  Surgeon: Lucas Mallow, MD;  Location: [facility];  Service: Urology;  Laterality: Right;     HPI  from the history and  physical done on the day of admission:    Chief Complaint: Altered mental status.  HPI: Douglas Fowler is a 54 y.o. male with medical history significant of CAD, history of PCI with placement of 3 coronary stents, history of prostate cancer, history of multiple CVAs, hypertension, depression, history of chronic alcohol use, alcohol induced seizures, history of alcohol withdrawal  ED Course: Initial vital signs were temperature Temperature 100.2 F, pulse 80, respiration 18, blood pressure 138/82 mmHg O2 sat 97% on room air. The patient received 2000 mL of NS bolus, 650 mg of acetaminophen PR x1 and 1 g of ceftriaxone IVPB.  Labs: His urinalysis was hazy with large hemoglobinuria with more than 50 RBC per hpf on microscopic examination, ketonuria 5 and proteinuria 100 mg/dL. There was trace leukocyte esterase with 11-20 WBC per hpf. There was no bacteria seen. His CBC showed a white count of 8.4 with a normal differential, hemoglobin 9.8 g/dL and platelets 160. PT was 13.7 and INR 1.1. His hemoglobin level was 11.4 g/dL 16 days ago. Lactic acid was normal.   Imaging: CT head without  contrast did not show any acute intracranial normality. There is extensive right greater than left cerebral encephalomalacia.     Hospital Course:    - Brief Summary:- 54 y.o.malewith medical history significant ofCAD, history of PCI with placement of 3 coronary stents, history of prostate cancer, history of multiple CVAs, hypertension, depression, history of chronic alcohol use, alcohol induced seizures in the past admitted on 09/21/2020 with altered mentation and concerns for pneumonia and UTI ---After further conversations with per palliative care team--family has decided to transition to full comfort care, DNR status,  A/p  1) acute metabolic encephalopathy superimposed on underlying dementia in a patient with history of heavy alcohol use previously--- -treat for suspected pneumonia and UTI with IV  Rocephin and azithromycin -May use as needed lorazepam as needed Haldol -Anticipate improvement in mental status with improvement in suspected infection -According to patient's wife patient's current mental status is drastically different from his baseline (patient is a lot more lethargic with episodes of agitation and combativeness which according to patient's wife is not usual for him) --After further conversations with per palliative care team--family has decided to transition to full comfort care, DNR status  2)CAP--- patient with cough, low-grade temp and chest x-ray findings suggestive of possible pneumonia -Patient does not meet sepsis criteria -Treated empirically with IV Rocephin and azithromycin   -COVID-19 test negative x2 --After further conversations with per palliative care team--family has decided to transition to full comfort care, DNR status,  3)Possible UTI--- probably contributing to #1 above, treated empirically with IV Rocephin  --After further conversations with per palliative care team--family has decided to transition to full comfort care, DNR status,  5)History of prostate CA--with bone mass, patient currently receiving hormonal therapy every 6 months last treatment was 08/24/2020- --After further conversations with per palliative care team--family has decided to transition to full comfort care, DNR status,  6)Social/Ethics--palliative care consult appreciated, patient is a DNR/DNI --After further conversations with per palliative care team--family has decided to transition to full comfort care, DNR status,  7)  dementia--patient with  baseline cognitive and  memory deficits, -Etiology of dementia is probably a combination of heavy alcohol use for prolonged period of time, history of recurrent alcohol-related withdrawal seizures as well as history of multiple CVAs with possible vascular component  --Treated with Aricept, continue risperidone and trazodone --After  further conversations with per palliative care team--family has decided to transition to full comfort care, DNR status,  8) history of CAD--- prior history of angioplasty and stenting, no ACS type symptoms at this time -Treated with Plavix and Lipitor  9)Seizure-mostly related to alcohol withdrawal previously, treated with Keppra and Depakote  10) chronic anemia--hemoglobin down to 9.5 with hydration, suspect anemia is probably neoplastic, and partly nutritional -No evidence of ongoing bleeding at this time continue to monitor   11)-hyponatremia--sodium was down to 132 suspect due to dehydration in the setting of diarrhea and poor oral intake, hydrated IV and orally --After further conversations with per palliative care team--family has decided to transition to full comfort care, DNR status,  Disposition--discharge to residential hospice with full comfort care  Disposition: The patient is from: Home  Anticipated d/c is to: Residential hospice Code Status : DNR  Family Communication:   Discussed with wife Consults  :  Palliative  -After further conversations with per palliative care team--family has decided to transition to full comfort care, DNR status,  Discharge Condition: --Overall prognosis is grave  Diet and Activity recommendation:  As advised  Discharge Instructions    Discharge Instructions    Bed rest   Complete by: As directed    Call MD for:  difficulty breathing, headache or visual disturbances   Complete by: As directed    Call MD for:  persistant nausea and vomiting   Complete by: As directed    Call MD for:  severe uncontrolled pain   Complete by: As directed    Call MD for:  temperature >100.4   Complete by: As directed    Diet general   Complete by: As directed    Discharge instructions   Complete by: As directed    --Please transfer to residential hospice with full comfort care        Discharge Medications       Allergies as of 09/29/2020   No Known Allergies     Medication List    STOP taking these medications   atorvastatin 80 MG tablet Commonly known as: LIPITOR   clopidogrel 75 MG tablet Commonly known as: PLAVIX   divalproex 500 MG DR tablet Commonly known as: DEPAKOTE   Fish Oil 1884 MG Caps   folic acid 1 MG tablet Commonly known as: FOLVITE   HYDROcodone-acetaminophen 5-325 MG tablet Commonly known as: NORCO/VICODIN   levETIRAcetam 500 MG tablet Commonly known as: KEPPRA   ondansetron 8 MG disintegrating tablet Commonly known as: ZOFRAN-ODT   ONE-A-DAY 50 PLUS PO   risperiDONE 1 MG tablet Commonly known as: RISPERDAL   tetrahydrozoline 0.05 % ophthalmic solution   traZODone 50 MG tablet Commonly known as: DESYREL       Major procedures and Radiology Reports - PLEASE review detailed and final reports for all details, in brief -    DG Chest 2 View  Result Date: 09/27/2020 CLINICAL DATA:  Altered mental status EXAM: CHEST - 2 VIEW COMPARISON:  05/11/2020 FINDINGS: Low lung volumes. Scattered perihilar opacity. Stable cardiomediastinal silhouette with aortic atherosclerosis. No pneumothorax. IMPRESSION: Low lung volumes. Scattered perihilar opacity, possible atelectasis versus atypical infection. Electronically Signed   By: Donavan Foil M.D.   On: 09/27/2020 17:44   CT Head Wo Contrast  Result Date: 09/27/2020 CLINICAL DATA:  Mental status change EXAM: CT HEAD WITHOUT CONTRAST TECHNIQUE: Contiguous axial images were obtained from the base of the skull through the vertex without intravenous contrast. COMPARISON:  09/11/2020 FINDINGS: Brain: There is no acute intracranial hemorrhage or mass effect. No new loss of gray-white differentiation. Extensive right greater than left cerebral encephalomalacia. Small chronic infarcts of the left basal ganglia and adjacent white matter and bilateral cerebellum. Ventricles are stable in size. No extra-axial collection. Vascular:  There is atherosclerotic calcification at the skull base. Skull: Calvarium is unremarkable. Sinuses/Orbits: No acute finding. Other: None. IMPRESSION: No acute intracranial abnormality. Stable chronic findings detailed above. Electronically Signed   By: Macy Mis M.D.   On: 09/27/2020 18:54   CT Head Wo Contrast  Result Date: 09/11/2020 CLINICAL DATA:  Mental status change.  Metastatic prostate cancer EXAM: CT HEAD WITHOUT CONTRAST TECHNIQUE: Contiguous axial images were obtained from the base of the skull through the vertex without intravenous contrast. COMPARISON:  CT head 05/11/2020 FINDINGS: Brain: Extensive encephalomalacia right MCA territory compatible chronic infarct unchanged. Also noted chronic infarcts in the left frontal lobe, left occipital parietal lobe, and left internal capsule unchanged. No new area of infarct, hemorrhage, mass when compared to the prior study. Vascular: Negative for hyperdense vessel Skull: Negative Sinuses/Orbits: Mild mucosal edema paranasal sinuses. Negative orbit Other: None IMPRESSION:  Chronic infarcts bilaterally. No acute infarct and no change from the prior study. Electronically Signed   By: Franchot Gallo M.D.   On: 09/11/2020 11:56    Micro Results   Recent Results (from the past 240 hour(s))  Urine culture     Status: Abnormal (Preliminary result)   Collection Time: 09/27/20  3:34 PM   Specimen: Urine, Random  Result Value Ref Range Status   Specimen Description   Final    URINE, RANDOM Performed at [facility], [address], [place], [place] [address]    Special Requests   Final    NONE Performed at [facility], [address] [place], [place] [address]    Culture (A)  Final    50,000 COLONIES/mL STAPHYLOCOCCUS EPIDERMIDIS SUSCEPTIBILITIES TO FOLLOW Performed at [facility], [place] [address], [place], [place] [address]    Report Status PENDING  Incomplete  Culture, blood (routine x 2)     Status: None (Preliminary result)    Collection Time: 09/27/20  4:16 PM   Specimen: Left Antecubital; Blood  Result Value Ref Range Status   Specimen Description LEFT ANTECUBITAL  Final   Special Requests   Final    BOTTLES DRAWN AEROBIC AND ANAEROBIC Blood Culture adequate volume   Culture   Final    NO GROWTH 2 DAYS Performed at [facility], [address], [place], [place] [address]    Report Status PENDING  Incomplete  Respiratory Panel by RT PCR (Flu A&B, Covid) - Nasopharyngeal Swab     Status: None   Collection Time: 09/27/20  5:06 PM   Specimen: Nasopharyngeal Swab  Result Value Ref Range Status   SARS Coronavirus 2 by RT PCR NEGATIVE NEGATIVE Final    Comment: (NOTE) SARS-CoV-2 target nucleic acids are NOT DETECTED.  The SARS-CoV-2 RNA is generally detectable in upper respiratoy specimens during the acute phase of infection. The lowest concentration of SARS-CoV-2 viral copies this assay can detect is 131 copies/mL. A negative result does not preclude SARS-Cov-2 infection and should not be used as the sole basis for treatment or other patient management decisions. A negative result may occur with  improper specimen collection/handling, submission of specimen other than nasopharyngeal swab, presence of viral mutation(s) within the areas targeted by this assay, and inadequate number of viral copies (<131 copies/mL). A negative result must be combined with clinical observations, patient history, and epidemiological information. The expected result is Negative.  Fact Sheet for Patients:  PinkCheek.be  Fact Sheet for Healthcare Providers:  GravelBags.it  This test is no t yet approved or cleared by the [place] FDA and  has been authorized for detection and/or diagnosis of SARS-CoV-2 by FDA under an Emergency Use Authorization (EUA). This EUA will remain  in effect (meaning this test can be used) for the duration of the COVID-19 declaration under  Section 564(b)(1) of the Act, 21 U.S.C. section 360bbb-3(b)(1), unless the authorization is terminated or revoked sooner.     Influenza A by PCR NEGATIVE NEGATIVE Final   Influenza B by PCR NEGATIVE NEGATIVE Final    Comment: (NOTE) The Xpert Xpress SARS-CoV-2/FLU/RSV assay is intended as an aid in  the diagnosis of influenza from Nasopharyngeal swab specimens and  should not be used as a sole basis for treatment. Nasal washings and  aspirates are unacceptable for Xpert Xpress SARS-CoV-2/FLU/RSV  testing.  Fact Sheet for Patients: PinkCheek.be  Fact Sheet for Healthcare Providers: GravelBags.it  This test is not yet approved or cleared by  the [employer] and  has been authorized for detection and/or diagnosis of SARS-CoV-2 by  FDA under an Emergency Use Authorization (EUA). This EUA will remain  in effect (meaning this test can be used) for the duration of the  Covid-19 declaration under Section 564(b)(1) of the Act, 21  U.S.C. section 360bbb-3(b)(1), unless the authorization is  terminated or revoked. Performed at [facility], [address], [place], [place] [address]   Culture, blood (routine x 2)     Status: None (Preliminary result)   Collection Time: 09/27/20  8:28 PM   Specimen: BLOOD RIGHT HAND  Result Value Ref Range Status   Specimen Description BLOOD RIGHT HAND  Final   Special Requests   Final    BOTTLES DRAWN AEROBIC AND ANAEROBIC Blood Culture adequate volume   Culture   Final    NO GROWTH 2 DAYS Performed at [facility], [address], [place], [place] [address]    Report Status PENDING  Incomplete  Respiratory Panel by RT PCR (Flu A&B, Covid) - Nasopharyngeal Swab     Status: None   Collection Time: 09/28/20  2:23 PM   Specimen: Nasopharyngeal Swab  Result Value Ref Range Status   SARS Coronavirus 2 by RT PCR NEGATIVE NEGATIVE Final    Comment: (NOTE) SARS-CoV-2 target nucleic acids are NOT  DETECTED.  The SARS-CoV-2 RNA is generally detectable in upper respiratoy specimens during the acute phase of infection. The lowest concentration of SARS-CoV-2 viral copies this assay can detect is 131 copies/mL. A negative result does not preclude SARS-Cov-2 infection and should not be used as the sole basis for treatment or other patient management decisions. A negative result may occur with  improper specimen collection/handling, submission of specimen other than nasopharyngeal swab, presence of viral mutation(s) within the areas targeted by this assay, and inadequate number of viral copies (<131 copies/mL). A negative result must be combined with clinical observations, patient history, and epidemiological information. The expected result is Negative.  Fact Sheet for Patients:  PinkCheek.be  Fact Sheet for Healthcare Providers:  GravelBags.it  This test is no t yet approved or cleared by the [place] FDA and  has been authorized for detection and/or diagnosis of SARS-CoV-2 by FDA under an Emergency Use Authorization (EUA). This EUA will remain  in effect (meaning this test can be used) for the duration of the COVID-19 declaration under Section 564(b)(1) of the Act, 21 U.S.C. section 360bbb-3(b)(1), unless the authorization is terminated or revoked sooner.     Influenza A by PCR NEGATIVE NEGATIVE Final   Influenza B by PCR NEGATIVE NEGATIVE Final    Comment: (NOTE) The Xpert Xpress SARS-CoV-2/FLU/RSV assay is intended as an aid in  the diagnosis of influenza from Nasopharyngeal swab specimens and  should not be used as a sole basis for treatment. Nasal washings and  aspirates are unacceptable for Xpert Xpress SARS-CoV-2/FLU/RSV  testing.  Fact Sheet for Patients: PinkCheek.be  Fact Sheet for Healthcare Providers: GravelBags.it  This test is not yet  approved or cleared by the [place] FDA and  has been authorized for detection and/or diagnosis of SARS-CoV-2 by  FDA under an Emergency Use Authorization (EUA). This EUA will remain  in effect (meaning this test can be used) for the duration of the  Covid-19 declaration under Section 564(b)(1) of the Act, 21  U.S.C. section 360bbb-3(b)(1), unless the authorization is  terminated or revoked. Performed at [facility], [address], [place], [place] [address]  Today   Subjective    Douglas Fowler today has no new complaints -Resting comfortably --After further conversations with per palliative care team--family has decided to transition to full comfort care, DNR status,          Patient has been seen and examined prior to discharge   Objective   Blood pressure (!) 144/63, pulse 64, temperature 98 F (36.7 C), temperature source Axillary, resp. rate 18, height 5\' 6"  (1.676 m), weight 68 kg, SpO2 94 %.   Intake/Output Summary (Last 24 hours) at 09/29/2020 1137 Last data filed at 09/29/2020 0600 Gross per 24 hour  Intake 2224.72 ml  Output 2350 ml  Net -125.28 ml    Exam Gen:-Resting, in no apparent distress  HEENT:- .AT, No sclera icterus Neck-Supple Neck,No JVD,.  Lungs-diminished breath sounds, no wheezing  CV- S1, S2 normal, regular  Abd-  +ve B.Sounds, Abd Soft, No tenderness,    Extremity/Skin:- No  edema, pedal pulses present  Psych-affect is flat, patient with disorientation and cognitive and memory deficits,  Neuro-generalized weakness, no new focal deficits,    Data Review   CBC w Diff:  Lab Results  Component Value Date   WBC 9.0 09/28/2020   HGB 9.5 (L) 09/28/2020   HCT 28.7 (L) 09/28/2020   PLT 127 (L) 09/28/2020   LYMPHOPCT 12 09/27/2020   MONOPCT 20 09/27/2020   EOSPCT 0 09/27/2020   BASOPCT 0 09/27/2020   CMP:  Lab Results  Component Value Date   NA 132 (L) 09/28/2020   K 3.8 09/28/2020   CL 100 09/28/2020   CO2 23  09/28/2020   BUN 13 09/28/2020   CREATININE 1.20 09/28/2020   PROT 5.6 (L) 09/28/2020   ALBUMIN 2.6 (L) 09/28/2020   BILITOT 0.4 09/28/2020   ALKPHOS 126 09/28/2020   AST 27 09/28/2020   ALT 15 09/28/2020  . ---After further conversations with per palliative care team--family has decided to transition to full comfort care, DNR status,  Total Discharge time is about 33 minutes  Roxan Hockey M.D on 09/29/2020 at 11:37 AM  Go to [email or web address] -  for contact info  [facility] - Office  [phone]

## 2020-09-29 NOTE — Progress Notes (Signed)
Nutrition Brief Note  Patient identified on the Malnutrition Screening Tool (MST) Report  Chart reviewed. Pt has transitioned to full comfort care. Plans to transition to residential hospice when bed is available  No nutrition interventions warranted at this time.   Please consult as needed.   Lajuan Lines, RD, LDN Clinical Nutrition After Hours/Weekend Pager # in [place]

## 2020-09-29 NOTE — TOC Transition Note (Signed)
Transition of Care [facility]) - CM/SW Discharge Note   Patient Details  Name: Douglas Fowler MRN: [id number] Date of Birth: 07-16-1966  [facility]) CM/SW Contact:  Shade Flood, LCSW Phone Number: 09/29/2020, 11:49 AM   Clinical Narrative:     TOC following. Pt is accepted for residential hospice care and they can accept pt today. Spoke with pt's daughter who states that they are aware of and in agreement with plan for dc to hospice today.   RN to call report. Will arrange EMS.  No other TOC needs for dc.  Final next level of care: [place] Barriers to Discharge: Barriers Resolved   Patient Goals and CMS Choice        Discharge Placement                       Discharge Plan and Services                                     Social Determinants of Health (SDOH) Interventions     Readmission Risk Interventions No flowsheet data found.

## 2020-09-29 NOTE — Discharge Instructions (Signed)
-  Please transfer to residential hospice with full comfort care

## 2020-09-30 LAB — URINE CULTURE: Culture: 50000 — AB

## 2020-10-02 LAB — CULTURE, BLOOD (ROUTINE X 2)
Culture: NO GROWTH
Culture: NO GROWTH
Special Requests: ADEQUATE
Special Requests: ADEQUATE

## 2020-11-01 DEATH — deceased

## 2020-12-15 IMAGING — CT CT HEAD W/O CM
3 series · 16 of 47 positions shown, 19 images · non-contrast
Comparison: CT head 05/11/2020

CLINICAL DATA: Mental status change.  Metastatic prostate cancer

EXAM:
CT HEAD WITHOUT CONTRAST
TECHNIQUE: Contiguous axial images were obtained from the base of the skull
through the vertex without intravenous contrast.

[Series 2: head w o · axial · 0.44mm/px · z∈[+156,+281]mm · 10 of 31 slices shown, 13 images]
[im 3/31  brain]
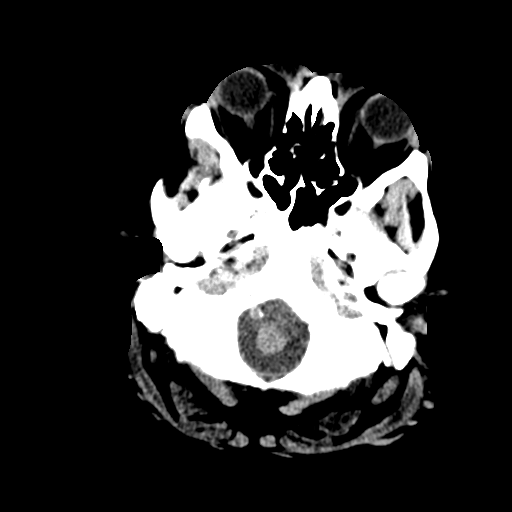
[im 3/31  bone]
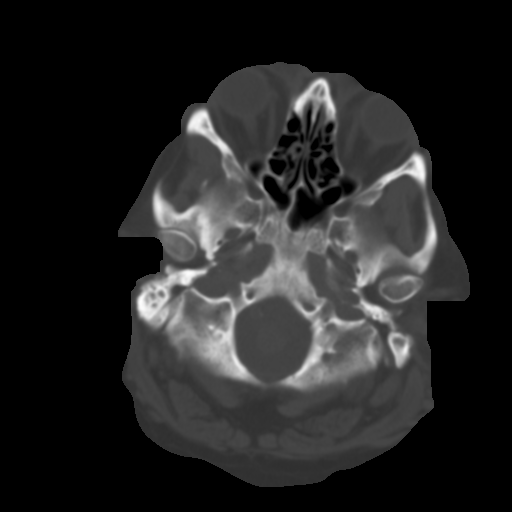
[im 6/31  brain]
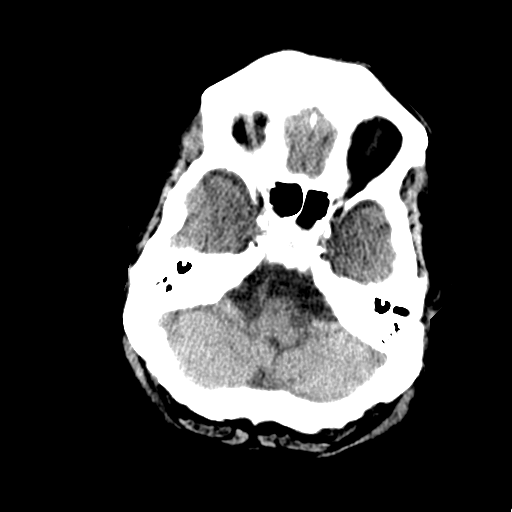
[im 9/31  brain]
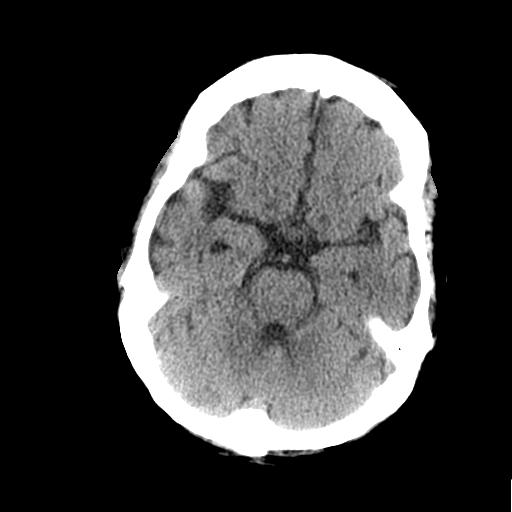
[im 11/31  brain]
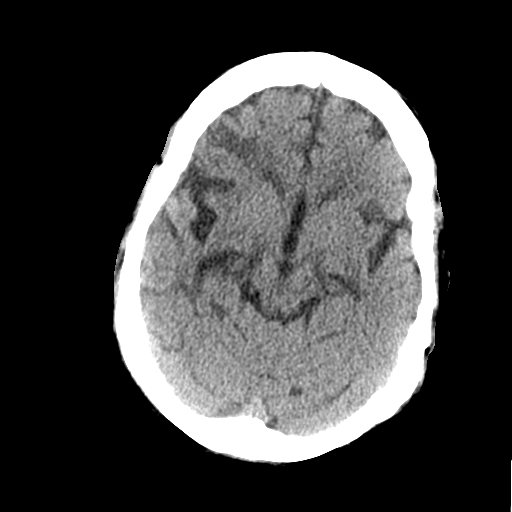
[im 14/31  brain]
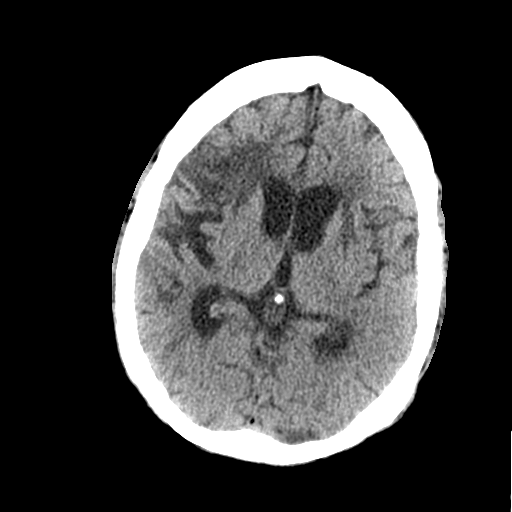
[im 14/31  bone]
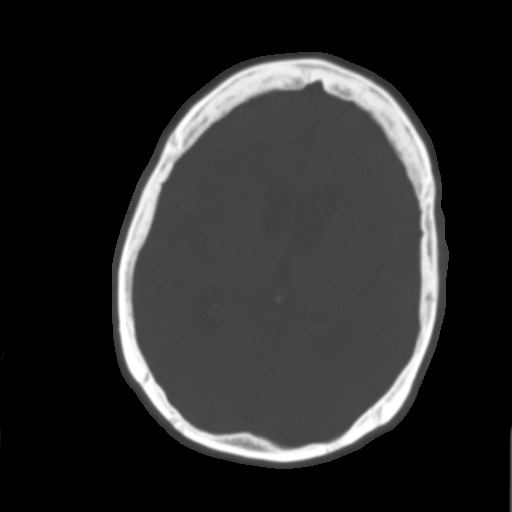
[im 17/31  brain]
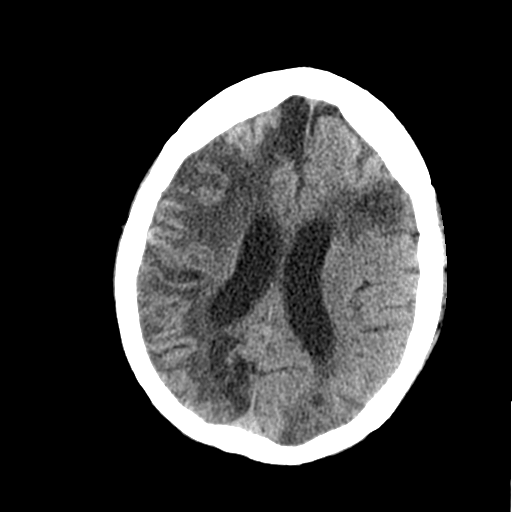
[im 20/31  brain]
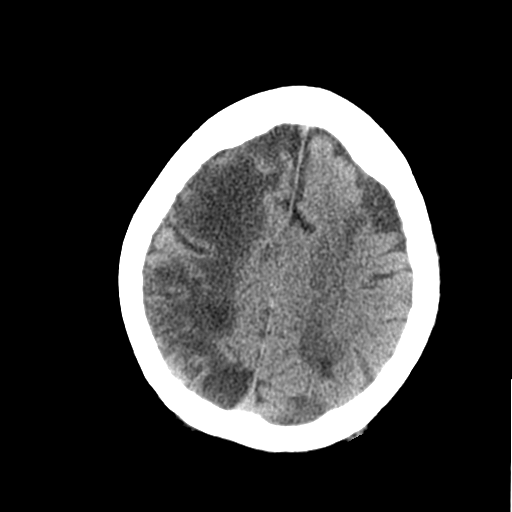
[im 23/31  brain]
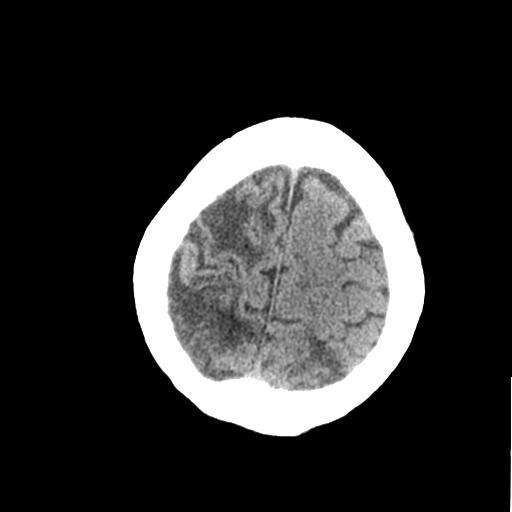
[im 25/31  brain]
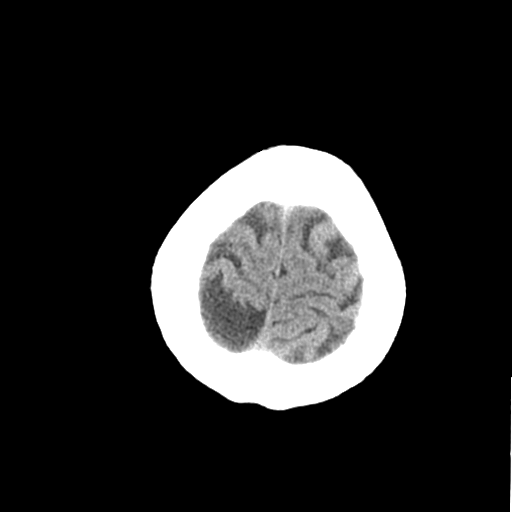
[im 25/31  bone]
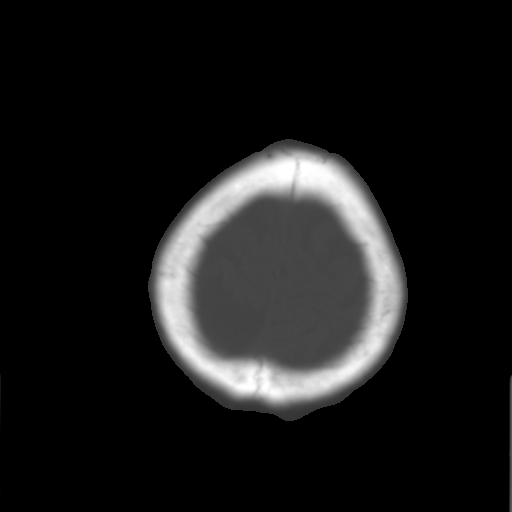
[im 28/31  brain]
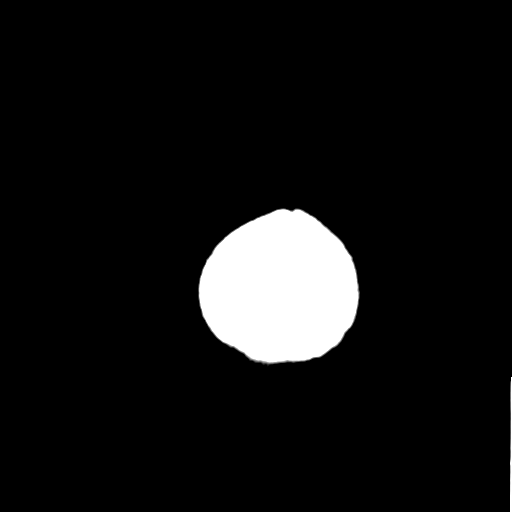

[Series 4: coronal soft · coronal · 0.32mm/px · 3 of 66 slices shown]
[im 22/66  brain]
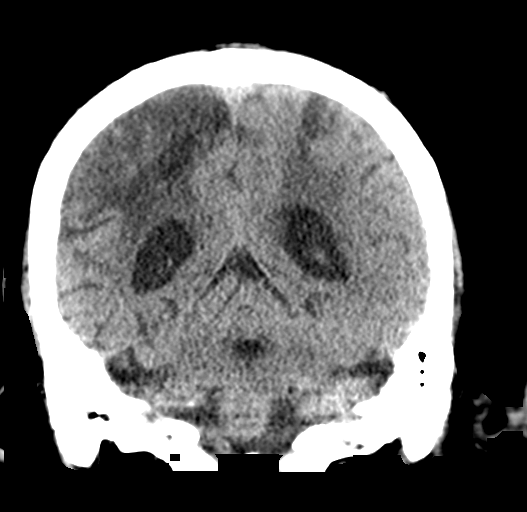
[im 29/66  brain]
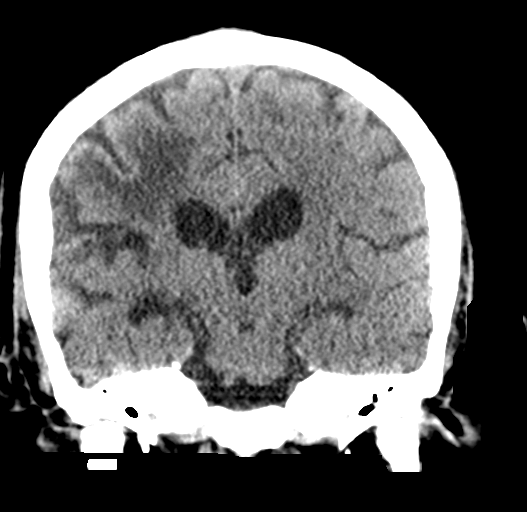
[im 37/66  brain]
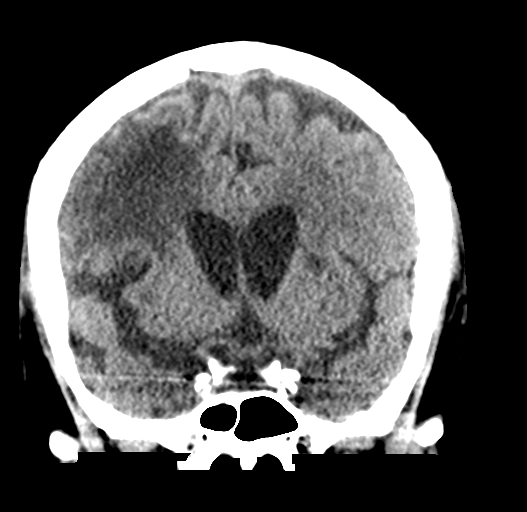

[Series 5: sagittal soft · sagittal · 0.32mm/px · 3 of 58 slices shown]
[im 20/58  brain]
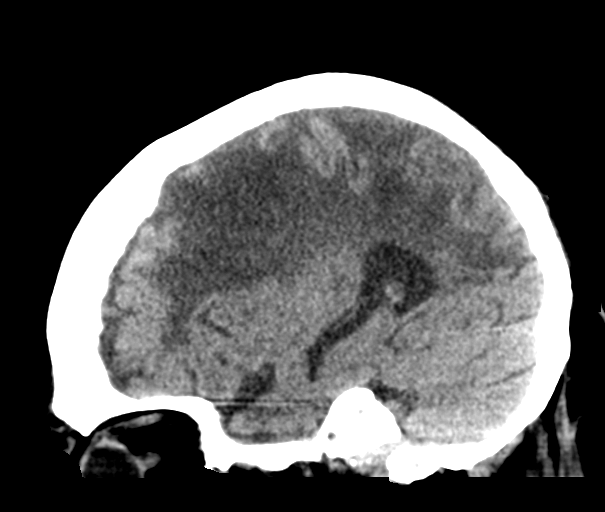
[im 29/58  brain]
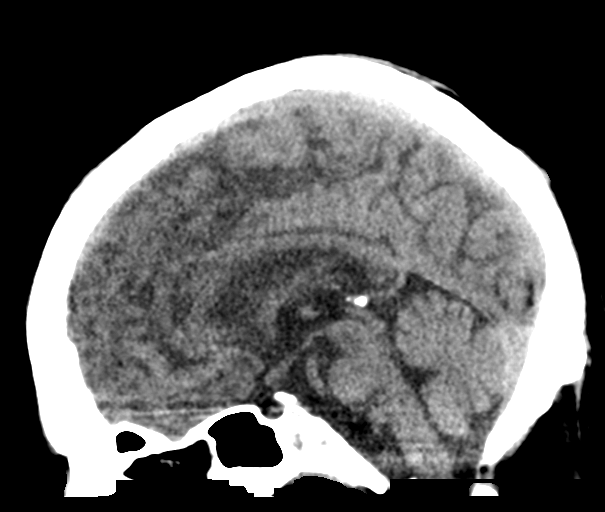
[im 39/58  brain]
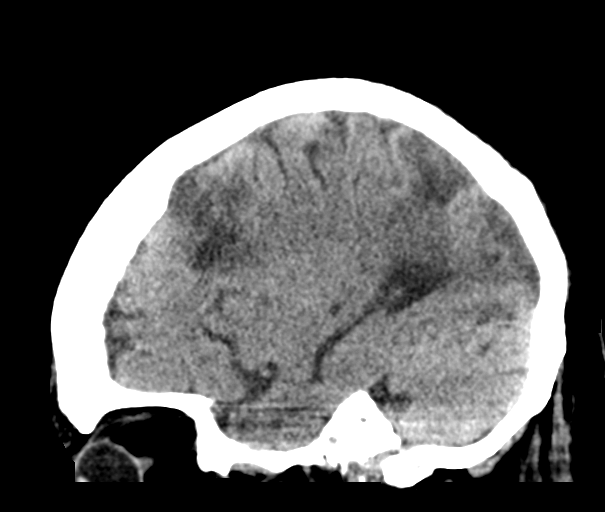

[16 of 47 positions shown; findings below may reference images not displayed]

FINDINGS: Brain: Extensive encephalomalacia right MCA territory compatible
chronic infarct unchanged. Also noted chronic infarcts in the left
frontal lobe, left occipital parietal lobe, and left internal
capsule unchanged.

No new area of infarct, hemorrhage, mass when compared to the prior
study.

Vascular: Negative for hyperdense vessel

Skull: Negative

Sinuses/Orbits: Mild mucosal edema paranasal sinuses. Negative orbit

Other: None
IMPRESSION: Chronic infarcts bilaterally. No acute infarct and no change from
the prior study.
